# Patient Record
Sex: Female | Born: 1960 | Race: Black or African American | Hispanic: No | Marital: Married | Smoking: Former smoker
Health system: Southern US, Community
[De-identification: ages and names within clinical notes are randomized; demographics above are authoritative.]

## PROBLEM LIST (undated history)

## (undated) DIAGNOSIS — I509 Heart failure, unspecified: Secondary | ICD-10-CM

## (undated) DIAGNOSIS — I4891 Unspecified atrial fibrillation: Secondary | ICD-10-CM

## (undated) DIAGNOSIS — Z95810 Presence of automatic (implantable) cardiac defibrillator: Secondary | ICD-10-CM

## (undated) DIAGNOSIS — E079 Disorder of thyroid, unspecified: Secondary | ICD-10-CM

## (undated) DIAGNOSIS — F32A Depression, unspecified: Secondary | ICD-10-CM

## (undated) DIAGNOSIS — Z950 Presence of cardiac pacemaker: Secondary | ICD-10-CM

## (undated) DIAGNOSIS — T82110A Breakdown (mechanical) of cardiac electrode, initial encounter: Secondary | ICD-10-CM

## (undated) DIAGNOSIS — F1411 Cocaine abuse, in remission: Secondary | ICD-10-CM

## (undated) DIAGNOSIS — I5022 Chronic systolic (congestive) heart failure: Secondary | ICD-10-CM

## (undated) DIAGNOSIS — F1910 Other psychoactive substance abuse, uncomplicated: Secondary | ICD-10-CM

## (undated) DIAGNOSIS — K219 Gastro-esophageal reflux disease without esophagitis: Secondary | ICD-10-CM

## (undated) DIAGNOSIS — F329 Major depressive disorder, single episode, unspecified: Secondary | ICD-10-CM

## (undated) DIAGNOSIS — N182 Chronic kidney disease, stage 2 (mild): Secondary | ICD-10-CM

## (undated) DIAGNOSIS — I428 Other cardiomyopathies: Secondary | ICD-10-CM

## (undated) DIAGNOSIS — I471 Supraventricular tachycardia, unspecified: Secondary | ICD-10-CM

## (undated) DIAGNOSIS — I469 Cardiac arrest, cause unspecified: Secondary | ICD-10-CM

## (undated) DIAGNOSIS — Z87891 Personal history of nicotine dependence: Secondary | ICD-10-CM

## (undated) DIAGNOSIS — D649 Anemia, unspecified: Secondary | ICD-10-CM

## (undated) DIAGNOSIS — I10 Essential (primary) hypertension: Secondary | ICD-10-CM

## (undated) DIAGNOSIS — I447 Left bundle-branch block, unspecified: Secondary | ICD-10-CM

## (undated) DIAGNOSIS — R0602 Shortness of breath: Secondary | ICD-10-CM

## (undated) HISTORY — DX: Personal history of nicotine dependence: Z87.891

## (undated) HISTORY — DX: Other psychoactive substance abuse, uncomplicated: F19.10

## (undated) HISTORY — DX: Supraventricular tachycardia, unspecified: I47.10

## (undated) HISTORY — DX: Anemia, unspecified: D64.9

## (undated) HISTORY — DX: Breakdown (mechanical) of cardiac electrode, initial encounter: T82.110A

## (undated) HISTORY — DX: Supraventricular tachycardia: I47.1

## (undated) HISTORY — DX: Other cardiomyopathies: I42.8

## (undated) HISTORY — DX: Chronic kidney disease, stage 2 (mild): N18.2

## (undated) HISTORY — DX: Left bundle-branch block, unspecified: I44.7

## (undated) HISTORY — PX: CARDIAC DEFIBRILLATOR PLACEMENT: SHX171

## (undated) HISTORY — DX: Presence of automatic (implantable) cardiac defibrillator: Z95.810

## (undated) HISTORY — DX: Cocaine abuse, in remission: F14.11

## (undated) HISTORY — DX: Unspecified atrial fibrillation: I48.91

## (undated) HISTORY — DX: Chronic systolic (congestive) heart failure: I50.22

## (undated) HISTORY — DX: Cardiac arrest, cause unspecified: I46.9

---

## 2000-09-27 ENCOUNTER — Inpatient Hospital Stay (HOSPITAL_COMMUNITY): Admission: AD | Admit: 2000-09-27 | Discharge: 2000-09-27 | Payer: Self-pay | Admitting: Obstetrics & Gynecology

## 2000-09-30 ENCOUNTER — Encounter: Payer: Self-pay | Admitting: Emergency Medicine

## 2000-09-30 ENCOUNTER — Inpatient Hospital Stay (HOSPITAL_COMMUNITY): Admission: EM | Admit: 2000-09-30 | Discharge: 2000-10-03 | Payer: Self-pay | Admitting: Emergency Medicine

## 2000-10-27 ENCOUNTER — Emergency Department (HOSPITAL_COMMUNITY): Admission: EM | Admit: 2000-10-27 | Discharge: 2000-10-27 | Payer: Self-pay | Admitting: Emergency Medicine

## 2000-12-19 ENCOUNTER — Inpatient Hospital Stay (HOSPITAL_COMMUNITY): Admission: AD | Admit: 2000-12-19 | Discharge: 2000-12-19 | Payer: Self-pay | Admitting: Obstetrics

## 2000-12-19 ENCOUNTER — Encounter: Payer: Self-pay | Admitting: Obstetrics

## 2000-12-26 ENCOUNTER — Observation Stay (HOSPITAL_COMMUNITY): Admission: AD | Admit: 2000-12-26 | Discharge: 2000-12-27 | Payer: Self-pay | Admitting: *Deleted

## 2000-12-26 ENCOUNTER — Encounter: Payer: Self-pay | Admitting: *Deleted

## 2000-12-29 ENCOUNTER — Inpatient Hospital Stay (HOSPITAL_COMMUNITY): Admission: AD | Admit: 2000-12-29 | Discharge: 2000-12-31 | Payer: Self-pay | Admitting: Obstetrics

## 2001-01-05 ENCOUNTER — Encounter: Admission: RE | Admit: 2001-01-05 | Discharge: 2001-01-05 | Payer: Self-pay | Admitting: Obstetrics & Gynecology

## 2001-01-11 ENCOUNTER — Ambulatory Visit (HOSPITAL_COMMUNITY): Admission: RE | Admit: 2001-01-11 | Discharge: 2001-01-11 | Payer: Self-pay | Admitting: Obstetrics

## 2001-01-12 ENCOUNTER — Encounter: Admission: RE | Admit: 2001-01-12 | Discharge: 2001-01-12 | Payer: Self-pay | Admitting: Obstetrics & Gynecology

## 2001-02-03 ENCOUNTER — Encounter: Admission: RE | Admit: 2001-02-03 | Discharge: 2001-02-03 | Payer: Self-pay | Admitting: Obstetrics

## 2001-02-10 ENCOUNTER — Encounter: Admission: RE | Admit: 2001-02-10 | Discharge: 2001-02-10 | Payer: Self-pay | Admitting: Obstetrics

## 2001-02-17 ENCOUNTER — Encounter: Admission: RE | Admit: 2001-02-17 | Discharge: 2001-02-17 | Payer: Self-pay | Admitting: Obstetrics

## 2001-02-17 ENCOUNTER — Inpatient Hospital Stay (HOSPITAL_COMMUNITY): Admission: AD | Admit: 2001-02-17 | Discharge: 2001-02-17 | Payer: Self-pay | Admitting: Obstetrics & Gynecology

## 2001-02-24 ENCOUNTER — Encounter: Admission: RE | Admit: 2001-02-24 | Discharge: 2001-02-24 | Payer: Self-pay | Admitting: Obstetrics

## 2001-03-10 ENCOUNTER — Encounter: Admission: RE | Admit: 2001-03-10 | Discharge: 2001-03-10 | Payer: Self-pay | Admitting: Obstetrics

## 2001-03-20 ENCOUNTER — Inpatient Hospital Stay (HOSPITAL_COMMUNITY): Admission: AD | Admit: 2001-03-20 | Discharge: 2001-03-22 | Payer: Self-pay | Admitting: Obstetrics

## 2001-03-21 ENCOUNTER — Encounter: Payer: Self-pay | Admitting: Obstetrics

## 2001-05-06 ENCOUNTER — Inpatient Hospital Stay (HOSPITAL_COMMUNITY): Admission: AD | Admit: 2001-05-06 | Discharge: 2001-05-06 | Payer: Self-pay | Admitting: *Deleted

## 2001-07-18 ENCOUNTER — Emergency Department (HOSPITAL_COMMUNITY): Admission: EM | Admit: 2001-07-18 | Discharge: 2001-07-18 | Payer: Self-pay | Admitting: *Deleted

## 2003-07-19 ENCOUNTER — Emergency Department (HOSPITAL_COMMUNITY): Admission: EM | Admit: 2003-07-19 | Discharge: 2003-07-19 | Payer: Self-pay

## 2003-07-20 ENCOUNTER — Inpatient Hospital Stay (HOSPITAL_COMMUNITY): Admission: AD | Admit: 2003-07-20 | Discharge: 2003-07-20 | Payer: Self-pay | Admitting: Obstetrics & Gynecology

## 2004-05-01 ENCOUNTER — Inpatient Hospital Stay (HOSPITAL_COMMUNITY): Admission: AD | Admit: 2004-05-01 | Discharge: 2004-05-01 | Payer: Self-pay | Admitting: Obstetrics & Gynecology

## 2005-01-14 ENCOUNTER — Emergency Department (HOSPITAL_COMMUNITY): Admission: EM | Admit: 2005-01-14 | Discharge: 2005-01-14 | Payer: Self-pay | Admitting: Emergency Medicine

## 2005-06-29 ENCOUNTER — Emergency Department (HOSPITAL_COMMUNITY): Admission: EM | Admit: 2005-06-29 | Discharge: 2005-06-29 | Payer: Self-pay | Admitting: Emergency Medicine

## 2005-12-08 ENCOUNTER — Inpatient Hospital Stay (HOSPITAL_COMMUNITY): Admission: AD | Admit: 2005-12-08 | Discharge: 2005-12-08 | Payer: Self-pay | Admitting: *Deleted

## 2006-02-07 ENCOUNTER — Emergency Department (HOSPITAL_COMMUNITY): Admission: EM | Admit: 2006-02-07 | Discharge: 2006-02-08 | Payer: Self-pay | Admitting: Emergency Medicine

## 2006-06-09 ENCOUNTER — Inpatient Hospital Stay (HOSPITAL_COMMUNITY): Admission: AD | Admit: 2006-06-09 | Discharge: 2006-06-09 | Payer: Self-pay | Admitting: Gynecology

## 2006-08-14 ENCOUNTER — Inpatient Hospital Stay (HOSPITAL_COMMUNITY): Admission: EM | Admit: 2006-08-14 | Discharge: 2006-08-17 | Payer: Self-pay | Admitting: Emergency Medicine

## 2006-08-27 ENCOUNTER — Inpatient Hospital Stay (HOSPITAL_COMMUNITY): Admission: AD | Admit: 2006-08-27 | Discharge: 2006-08-27 | Payer: Self-pay | Admitting: Family Medicine

## 2006-09-15 ENCOUNTER — Ambulatory Visit (HOSPITAL_COMMUNITY): Admission: RE | Admit: 2006-09-15 | Discharge: 2006-09-15 | Payer: Self-pay | Admitting: Otolaryngology

## 2006-09-22 ENCOUNTER — Ambulatory Visit (HOSPITAL_COMMUNITY): Admission: RE | Admit: 2006-09-22 | Discharge: 2006-09-22 | Payer: Self-pay | Admitting: Otolaryngology

## 2006-09-29 ENCOUNTER — Ambulatory Visit (HOSPITAL_COMMUNITY): Admission: RE | Admit: 2006-09-29 | Discharge: 2006-09-29 | Payer: Self-pay | Admitting: Otolaryngology

## 2006-10-08 ENCOUNTER — Ambulatory Visit (HOSPITAL_BASED_OUTPATIENT_CLINIC_OR_DEPARTMENT_OTHER): Admission: RE | Admit: 2006-10-08 | Discharge: 2006-10-08 | Payer: Self-pay | Admitting: Otolaryngology

## 2007-12-26 ENCOUNTER — Emergency Department (HOSPITAL_COMMUNITY): Admission: EM | Admit: 2007-12-26 | Discharge: 2007-12-26 | Payer: Self-pay | Admitting: Family Medicine

## 2008-02-15 ENCOUNTER — Ambulatory Visit: Payer: Self-pay | Admitting: Family Medicine

## 2008-05-17 ENCOUNTER — Ambulatory Visit: Payer: Self-pay | Admitting: Internal Medicine

## 2008-05-25 ENCOUNTER — Ambulatory Visit: Payer: Self-pay | Admitting: Internal Medicine

## 2008-06-26 ENCOUNTER — Ambulatory Visit: Payer: Self-pay | Admitting: *Deleted

## 2008-07-03 ENCOUNTER — Emergency Department (HOSPITAL_COMMUNITY): Admission: EM | Admit: 2008-07-03 | Discharge: 2008-07-03 | Payer: Self-pay | Admitting: Emergency Medicine

## 2008-09-06 ENCOUNTER — Ambulatory Visit: Payer: Self-pay | Admitting: Family Medicine

## 2008-09-06 ENCOUNTER — Encounter: Payer: Self-pay | Admitting: Family Medicine

## 2008-09-21 ENCOUNTER — Ambulatory Visit (HOSPITAL_COMMUNITY): Admission: RE | Admit: 2008-09-21 | Discharge: 2008-09-21 | Payer: Self-pay | Admitting: Family Medicine

## 2008-12-10 ENCOUNTER — Encounter: Payer: Self-pay | Admitting: Family Medicine

## 2008-12-10 ENCOUNTER — Ambulatory Visit: Payer: Self-pay | Admitting: Family Medicine

## 2008-12-10 LAB — CONVERTED CEMR LAB
Albumin: 4 g/dL (ref 3.5–5.2)
Alkaline Phosphatase: 62 units/L (ref 39–117)
BUN: 14 mg/dL (ref 6–23)
Eosinophils Absolute: 0.1 10*3/uL (ref 0.0–0.7)
Eosinophils Relative: 2 % (ref 0–5)
Glucose, Bld: 63 mg/dL — ABNORMAL LOW (ref 70–99)
HCT: 35.2 % — ABNORMAL LOW (ref 36.0–46.0)
HDL: 52 mg/dL (ref >39)
Hemoglobin: 10.5 g/dL — ABNORMAL LOW (ref 12.0–15.0)
LDL Cholesterol: 145 mg/dL — ABNORMAL HIGH (ref 0–99)
Lymphs Abs: 1.7 10*3/uL (ref 0.7–4.0)
MCV: 74.1 fL — ABNORMAL LOW (ref 78.0–100.0)
Monocytes Absolute: 0.4 10*3/uL (ref 0.1–1.0)
Monocytes Relative: 7 % (ref 3–12)
Neutrophils Relative %: 60 % (ref 43–77)
Potassium: 4.2 meq/L (ref 3.5–5.3)
RBC: 4.75 M/uL (ref 3.87–5.11)
Triglycerides: 62 mg/dL (ref <150)
WBC: 5.6 10*3/uL (ref 4.0–10.5)

## 2008-12-26 ENCOUNTER — Ambulatory Visit: Payer: Self-pay | Admitting: Internal Medicine

## 2009-02-11 ENCOUNTER — Ambulatory Visit: Payer: Self-pay | Admitting: Internal Medicine

## 2009-02-11 ENCOUNTER — Encounter (INDEPENDENT_AMBULATORY_CARE_PROVIDER_SITE_OTHER): Payer: Self-pay | Admitting: *Deleted

## 2009-02-11 LAB — CONVERTED CEMR LAB
CO2: 19 meq/L (ref 19–32)
Calcium: 8.8 mg/dL (ref 8.4–10.5)
Glucose, Bld: 67 mg/dL — ABNORMAL LOW (ref 70–99)
LDL Cholesterol: 148 mg/dL — ABNORMAL HIGH (ref 0–99)
Potassium: 4 meq/L (ref 3.5–5.3)
Sodium: 139 meq/L (ref 135–145)
Total CHOL/HDL Ratio: 3.9
VLDL: 9 mg/dL (ref 0–40)

## 2009-03-14 ENCOUNTER — Ambulatory Visit: Payer: Self-pay | Admitting: Internal Medicine

## 2009-05-09 ENCOUNTER — Ambulatory Visit: Payer: Self-pay | Admitting: Internal Medicine

## 2009-08-29 ENCOUNTER — Ambulatory Visit: Payer: Self-pay | Admitting: Internal Medicine

## 2009-09-18 ENCOUNTER — Ambulatory Visit: Payer: Self-pay | Admitting: Internal Medicine

## 2010-01-16 ENCOUNTER — Emergency Department (HOSPITAL_COMMUNITY): Admission: EM | Admit: 2010-01-16 | Discharge: 2010-01-16 | Payer: Self-pay | Admitting: Emergency Medicine

## 2010-07-12 ENCOUNTER — Ambulatory Visit: Payer: Self-pay | Admitting: Pulmonary Disease

## 2010-07-12 ENCOUNTER — Inpatient Hospital Stay (HOSPITAL_COMMUNITY): Admission: EM | Admit: 2010-07-12 | Discharge: 2010-07-20 | Payer: Self-pay | Admitting: Emergency Medicine

## 2010-07-12 ENCOUNTER — Ambulatory Visit: Payer: Self-pay | Admitting: Internal Medicine

## 2010-07-13 ENCOUNTER — Encounter (INDEPENDENT_AMBULATORY_CARE_PROVIDER_SITE_OTHER): Payer: Self-pay | Admitting: Emergency Medicine

## 2010-07-18 ENCOUNTER — Telehealth: Payer: Self-pay | Admitting: Cardiovascular Disease

## 2010-07-21 ENCOUNTER — Encounter: Payer: Self-pay | Admitting: Internal Medicine

## 2010-07-22 ENCOUNTER — Telehealth: Payer: Self-pay | Admitting: Internal Medicine

## 2010-07-23 ENCOUNTER — Encounter: Payer: Self-pay | Admitting: Internal Medicine

## 2010-07-23 ENCOUNTER — Ambulatory Visit: Payer: Self-pay

## 2010-07-31 DEATH — deceased

## 2010-08-05 ENCOUNTER — Emergency Department (HOSPITAL_COMMUNITY): Admission: EM | Admit: 2010-08-05 | Discharge: 2010-08-05 | Payer: Self-pay | Admitting: Family Medicine

## 2010-08-05 ENCOUNTER — Ambulatory Visit: Payer: Self-pay | Admitting: Internal Medicine

## 2010-08-05 ENCOUNTER — Encounter: Payer: Self-pay | Admitting: Internal Medicine

## 2010-08-05 DIAGNOSIS — D649 Anemia, unspecified: Secondary | ICD-10-CM | POA: Insufficient documentation

## 2010-08-05 DIAGNOSIS — I428 Other cardiomyopathies: Secondary | ICD-10-CM

## 2010-08-05 DIAGNOSIS — I5022 Chronic systolic (congestive) heart failure: Secondary | ICD-10-CM

## 2010-08-06 ENCOUNTER — Telehealth: Payer: Self-pay | Admitting: Internal Medicine

## 2010-08-06 LAB — CONVERTED CEMR LAB
Basophils Relative: 0.2 % (ref 0.0–3.0)
Eosinophils Relative: 0.3 % (ref 0.0–5.0)
Lymphocytes Relative: 10.6 % — ABNORMAL LOW (ref 12.0–46.0)
Monocytes Relative: 9.6 % (ref 3.0–12.0)
Neutrophils Relative %: 79.3 % — ABNORMAL HIGH (ref 43.0–77.0)
RBC: 4.07 M/uL (ref 3.87–5.11)
WBC: 8 10*3/uL (ref 4.5–10.5)

## 2010-08-07 ENCOUNTER — Encounter: Payer: Self-pay | Admitting: Internal Medicine

## 2010-08-13 DIAGNOSIS — I447 Left bundle-branch block, unspecified: Secondary | ICD-10-CM

## 2010-08-13 DIAGNOSIS — I4901 Ventricular fibrillation: Secondary | ICD-10-CM | POA: Insufficient documentation

## 2010-08-13 DIAGNOSIS — F1910 Other psychoactive substance abuse, uncomplicated: Secondary | ICD-10-CM

## 2010-08-13 DIAGNOSIS — Z95810 Presence of automatic (implantable) cardiac defibrillator: Secondary | ICD-10-CM

## 2010-08-15 ENCOUNTER — Ambulatory Visit: Payer: Self-pay | Admitting: Cardiovascular Disease

## 2010-08-20 ENCOUNTER — Telehealth (INDEPENDENT_AMBULATORY_CARE_PROVIDER_SITE_OTHER): Payer: Self-pay | Admitting: *Deleted

## 2010-08-28 ENCOUNTER — Telehealth: Payer: Self-pay | Admitting: Cardiovascular Disease

## 2010-09-01 ENCOUNTER — Telehealth: Payer: Self-pay | Admitting: Cardiovascular Disease

## 2010-09-17 ENCOUNTER — Encounter: Payer: Self-pay | Admitting: Cardiovascular Disease

## 2010-09-17 ENCOUNTER — Ambulatory Visit: Payer: Self-pay | Admitting: Cardiovascular Disease

## 2010-10-08 ENCOUNTER — Telehealth: Payer: Self-pay | Admitting: Cardiovascular Disease

## 2010-10-28 ENCOUNTER — Ambulatory Visit: Payer: Self-pay | Admitting: Internal Medicine

## 2010-11-04 ENCOUNTER — Telehealth: Payer: Self-pay | Admitting: Internal Medicine

## 2010-11-12 ENCOUNTER — Encounter: Payer: Self-pay | Admitting: Cardiovascular Disease

## 2010-11-13 ENCOUNTER — Telehealth (INDEPENDENT_AMBULATORY_CARE_PROVIDER_SITE_OTHER): Payer: Self-pay | Admitting: *Deleted

## 2011-01-01 NOTE — Cardiovascular Report (Signed)
Summary: Office Visit   Office Visit   Imported By: Roderic Ovens 08/12/2010 12:45:08  _____________________________________________________________________  External Attachment:    Type:   Image     Comment:   External Document

## 2011-01-01 NOTE — Letter (Signed)
Summary: Generic Letter  [facility]  [address]   [place], [place] [address]   Phone: [phone]  Fax: [phone]        July 23, 2010 MRN: [id number]    [facility] [address] [place], [place]  [address]    To Whom It May Concern,    Ms. Elford will be out of work until her follow up with Dr. Graciela Husbands in September.  For further questions please feel free to contact our office at [phone].   Sincerely,  Dr. Ailene Ards, RN, BSN  This letter has been electronically signed by your physician.

## 2011-01-01 NOTE — Progress Notes (Signed)
Summary: Pt request call  Phone Note Call from Patient Call back at Home Phone [phone]   Caller: Patient Reason for Call: Talk to Nurse Initial call taken by: Judie Grieve,  November 04, 2010 2:25 PM  Follow-up for Phone Call        adv pt that ok to take bc powder.  Follow-up by: Claris Gladden RN,  November 04, 2010 5:50 PM

## 2011-01-01 NOTE — Miscellaneous (Signed)
Summary: Device preload  Clinical Lists Changes  Observations: Added new observation of ICD INDICATN: VF (2010-07-23 16:05) Added new observation of ICDLEADSTAT3: active (07-23-2010 16:05) Added new observation of ICDLEADSER3: [id number] (July 23, 2010 16:05) Added new observation of ICDLEADMOD3: 1258T (2010/07/23 16:05) Added new observation of ICDLEADLOC3: LV (07-23-10 16:05) Added new observation of ICDLEADSTAT2: active (07-23-2010 16:05) Added new observation of ICDLEADSER2: [id number] (Jul 23, 2010 16:05) Added new observation of ICDLEADMOD2: 7121  (23-Jul-2010 16:05) Added new observation of ICDLEADLOC2: RV  (07-23-10 16:05) Added new observation of ICDLEADSTAT1: active  (Jul 23, 2010 16:05) Added new observation of ICDLEADSER1: [id number]  (23-Jul-2010 16:05) Added new observation of ICDLEADMOD1: 2088TC  (07-23-10 16:05) Added new observation of ICDLEADLOC1: RA  (July 23, 2010 16:05) Added new observation of ICD IMP MD: Sherryl Manges, MD  (07/23/10 16:05) Added new observation of ICDLEADDOI3: 07/18/2010  (2010-07-23 16:05) Added new observation of ICDLEADDOI2: 07/18/2010  (07-23-10 16:05) Added new observation of ICDLEADDOI1: 07/18/2010  (07/23/2010 16:05) Added new observation of ICD IMPL DTE: 07/18/2010  (2010-07-23 16:05) Added new observation of ICD SERL#: [id number]  (2010/07/23 16:05) Added new observation of ICD MODL#: [id number]  (2010/07/23 20:25) Added new observation of ICDMANUFACTR: St Jude  (07-23-2010 16:05) Added new observation of ICD MD: Sherryl Manges, MD  (07-23-2010 16:05)       ICD Specifications Following MD:  Sherryl Manges, MD     ICD Vendor:  St Jude     ICD Model Number:  [id number]     ICD Serial Number:  [id number] ICD DOI:  07/18/2010     ICD Implanting MD:  Sherryl Manges, MD  Lead 1:    Location: RA     DOI: 07/18/2010     Model #: [id number]     Serial #: [id number]     Status: active Lead 2:    Location: RV     DOI: 07/18/2010     Model #: [id number]     Serial #: [id number]      Status: active Lead 3:    Location: LV     DOI: 07/18/2010     Model #: 1258T     Serial #: [id number]     Status: active  Indications::  VF

## 2011-01-01 NOTE — Progress Notes (Signed)
Summary: can pt take calcium  Phone Note Call from Patient Call back at Home Phone [phone]   Caller: Patient Reason for Call: Talk to Nurse Summary of Call: can pt take calcium. Initial call taken by: Lorne Skeens,  September 01, 2010 2:48 PM  Follow-up for Phone Call        Pt was advised to take calcium supplement after depo shot at family planning.  Okay to take calcium supplement. Mylo Red RN

## 2011-01-01 NOTE — Progress Notes (Signed)
Summary: Pt request call  Phone Note Call from Patient Call back at Home Phone [phone]   Caller: Patient Summary of Call: Pt request call Initial call taken by: Judie Grieve,  August 06, 2010 11:20 AM  Follow-up for Phone Call        lmtcb Scherrie Bateman, LPN  August 06, 2010 4:23 PM  lmtcb Scherrie Bateman, LPN  August 07, 2010 9:20 AM  PT STOPPED BY OFFICE  NEEDING RETURN TO WORK NOTE PER DR Graciela Husbands MAY RETURN WITHOUT RESTRICTIONS ON 08/18/10 BUT   NO DRIVING FOR 6 MONTHS PT AWARE WILL LEAVE NOTE AT FRONT DESK FOR PICK UP. Follow-up by: Scherrie Bateman, LPN,  August 07, 2010 1:36 PM

## 2011-01-01 NOTE — Letter (Signed)
Summary: Return To Work  [employer], Main Office  [address]   [place], [place] [address]   Phone: [phone]  Fax: [phone]    08/07/2010  TO: WHOM IT MAY CONCERN   RE: Angela  Payne [address] [id number]   The above named individual is under my medical care and may return to work on:08/18/10  WITH LIGHT DUTY BUT NO DRIVING FOR 6 MONTHS.  If you have any further questions or need additional information, please call.     Sincerely,   DDR STEVE KLEIN/ Scherrie Bateman, LPN

## 2011-01-01 NOTE — Assessment & Plan Note (Signed)
Summary: pc2/after hours message   Visit Type:  ICD-St Jude  CC:  shortness of breath, fatigued, and .  History of Present Illness: Angela Payne is seen following aborted cardiac arrest which occurred August 2011. She is found to have a nonischemic cardiomyopathy with an ejection fraction of 15-30% and underwent ICD implantation for secondary prevention   She is here for folllow up. She tells me that her chest is sore when she lifts boxes at work. She works at [employer] at [employer]. No  SOB, near syncope, syncope, dizziness, orthopnea or PND, LE edema. She has been taking all of her medications. Overall energy level is down. She is not interested in trying a beta blocker exchange trial.  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 Take One Tablet By Mouth Once Daily 2)  Carvedilol 6.25 Mg Tabs (Carvedilol) .... Take 1/2 Tablet By Mouth Twice A Day 3)  Klor-Con M20 20 Meq Cr-Tabs (Potassium Chloride Crys Cr) .... Take One Tablet Once Daily 4)  Aspirin 81 Mg Tbec (Aspirin) .... Take One Tablet By Mouth Daily  Allergies (verified): No Known Drug Allergies  Past History:  Past Medical History: Last updated: 09/17/2010 SUBSTANCE ABUSE, MULTIPLE (ICD-305.90) LEFT BUNDLE BRANCH BLOCK (ICD-426.3) IMPLANTATION OF DEFIBRILLATOR, HX OF (ICD-V45.02) VENTRICULAR FIBRILLATION (ICD-427.41) arrest SYSTOLIC HEART FAILURE, CHRONIC (ICD-428.22) ANEMIA (ICD-285.9) SINUS TACHYCARDIA (ICD-427.0) CARDIOMYOPATHY, DILATED  (ICD-425.4) EF of 29% by MRI August 2011, non-ischemic. ATRIAL FIBRILLATION-PAROXYSMAL (ICD-427.31) ABORTED CARDIAC ARREST (ACA) (ICD-427.5)    Past Surgical History: Last updated: 08/13/2010 [facility]. Jude Unify [id number] Q defibrillator, serial number  [id number].   Family History: Last updated: 09/01/10 Mother-deceased, ? cause Father-alive, healthy  Social History: Last updated: 09/01/10 Former tobacco use, smoked for 14 years-quit August 2011.(1ppd) No alcohol No illicit drug use-former crack cocaine use last 2008 Single 4 kids Works for [employer] unemployed.   Vital Signs:  Patient profile:   50 year old female Height:      64 inches Weight:      157.75 pounds BMI:     27.18 Pulse rate:   76 / minute BP sitting:   135 / 92  (left arm) Cuff size:   regular  Vitals Entered By: Caralee Ates CMA (October 28, 2010 3:07 PM)  Physical Exam  General:  The patient was alert and oriented in no acute distress. HEENT Normal.  Neck veins were flat, carotids were brisk.  Lungs were clear.  Heart sounds were regular without murmurs or gallops.  Abdomen was soft with active bowel sounds. There is no clubbing cyanosis or edema. Skin Warm and dry     ICD Specifications Following MD:  Sherryl Manges, MD     ICD Vendor:  St Jude     ICD Model Number:  [id number]     ICD Serial Number:  [id number] ICD DOI:  07/18/2010     ICD Implanting MD:  Sherryl Manges, MD  Lead 1:    Location: RA     DOI: 07/18/2010     Model #: [id number]     Serial #: [id number]  Status: active Lead 2:    Location: RV     DOI: 07/18/2010     Model #: 1610     Serial #: [id number]     Status: active Lead 3:    Location: LV     DOI: 07/18/2010     Model #: 1258T     Serial #: [id number]     Status: active  Indications::  VF   ICD Follow Up Remote Check?  No Charge Time:  8.4 seconds     Battery Est. Longevity:  6.6 years Underlying rhythm:  SR ICD Dependent:  No       ICD Device Measurements Atrium:   Amplitude: 3.2 mV, Impedance: 360 ohms, Threshold: 0.625 V at 0.5 msec Right Ventricle:  Amplitude: 12 mV, Impedance: 480 ohms, Threshold: 0.5 V at 0.5 msec Left Ventricle:  Impedance: 850 ohms, Threshold: 1.125 V at 0.5 msec Shock Impedance: 42 ohms   Episodes MS Episodes:  0     Percent Mode Switch:  0     Coumadin:  No Shock:  0     ATP:  0     Nonsustained:  0     Atrial Pacing:  7.8%     Ventricular Pacing:  100%  Brady Parameters Mode DDD     Lower Rate Limit:  60     Upper Rate Limit 120 PAV 110     Sensed AV Delay:  110  Tachy Zones VF:  240     VT:  200     VT1:  150     Next Cardiology Appt Due:  12/31/2010 Tech Comments:  Outputs reprogrammed for chronic thresholds, autocapture on.  Quick opt done.  Changed to 2 zone device.   ROV 3 months clinic. Altha Harm, LPN  October 28, 2010 3:57 PM n  Impression & Recommendations:  Problem # 1:  VENTRICULAR FIBRILLATION (ICD-427.41) no recurrent ventricular arrhythmias Her updated medication list for this problem includes:    Lisinopril 5 Mg Tabs (Lisinopril) .Marland Kitchen... Take one tablet by mouth once daily    Carvedilol 6.25 Mg Tabs (Carvedilol) .Marland Kitchen... Take 1/2 tablet by mouth twice a day    Aspirin 81 Mg Tbec (Aspirin) .Marland Kitchen... Take one tablet by mouth daily  Problem # 2:  SYSTOLIC HEART FAILURE, CHRONIC (ICD-428.22) stable on current meds; I will defer to Dr. Colon Branch the addition of Aldactone Her updated medication list for this problem includes:    Lisinopril 5 Mg Tabs (Lisinopril) .Marland Kitchen... Take one tablet by mouth once daily    Carvedilol 6.25 Mg Tabs (Carvedilol) .Marland Kitchen... Take 1/2 tablet by mouth twice a day    Aspirin 81 Mg Tbec (Aspirin) .Marland Kitchen... Take one tablet by mouth daily  Problem # 3:  IMPLANTATION OF DEFIBRILLATOR, HX OF (ICD-V45.02) Device parameters and data were reviewed and no changes were made  Patient Instructions: 1)  Your physician recommends that you continue on your current medications as directed. Please refer to the  Current Medication list given to you today. 2)  Your physician wants you to follow-up in: 9 months   You will receive a reminder letter in the mail two months in advance. If you don't receive a letter, please call our office to schedule the follow-up appointment.

## 2011-01-01 NOTE — Assessment & Plan Note (Signed)
Summary: eph/jml   Visit Type:  Post-hospital  CC:  irregular heart beat.  History of Present Illness: 50 yo AAF with no prior past medical history who was admitted to [facility] in August 2011 after an out of hospital v. fib arrest. She was cooled on the Arctic sun protocol and underwent a cardiac cath which showed no evidence of obstructive CAD. 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 She had an ICD implanted by Dr. Graciela Husbands on July 18, 2010.   She is here for hospital folllow up. She is doing well. She has had no chest pain, SOB, near syncope, syncope, dizziness, orthopnea or PND, LE edema. She has been taking all of her medications. Occasional fluttering in her chest.   Current Medications (verified): 1)  Lisinopril 5 Mg Tabs (Lisinopril) .... Take One Tablet By Mouth Once Daily 2)  Carvedilol 25 Mg Tabs (Carvedilol) .... Take 1/2 Tablet Two Times A Day 3)  Oxycodone-Acetaminophen 5-325 Mg Tabs (Oxycodone-Acetaminophen) .... Take One Tablet Every 4 Hrs As Needed For Pain 4)  Klor-Con M20 20 Meq Cr-Tabs (Potassium Chloride Crys Cr) .... Take One Tablet Once Daily  Allergies (verified): No Known Drug Allergies  Past History:  Past Medical History: Reviewed history from 08/13/2010 and no changes required. SUBSTANCE ABUSE, MULTIPLE (ICD-305.90) LEFT BUNDLE BRANCH BLOCK (ICD-426.3) IMPLANTATION OF DEFIBRILLATOR, HX OF (ICD-V45.02) VENTRICULAR FIBRILLATION (ICD-427.41) SYSTOLIC HEART FAILURE, CHRONIC (ICD-428.22) ANEMIA (ICD-285.9) SINUS TACHYCARDIA (ICD-427.0) CARDIOMYOPATHY, DILATED (ICD-425.4) ATRIAL FIBRILLATION-PAROXYSMAL (ICD-427.31) ABORTED CARDIAC ARREST (ACA) (ICD-427.5)    Past Surgical History: Reviewed history from 08/13/2010 and no changes required. [address] Jude Unify [id number] Q defibrillator, serial number  [id number].   Family History: Mother-deceased, ? cause Father-alive, healthy  Social History: Former  tobacco use, smoked for 14 years-quit August 2011.(1ppd) No alcohol No illicit drug use-former crack cocaine use last 2008 Single 4 kids Works for [employer] unemployed.   Review of Systems       The patient complains of palpitations.  The patient denies fatigue, malaise, fever, weight gain/loss, vision loss, decreased hearing, hoarseness, chest pain, shortness of breath, prolonged cough, wheezing, sleep apnea, coughing up blood, abdominal pain, blood in stool, nausea, vomiting, diarrhea, heartburn, incontinence, blood in urine, muscle weakness, joint pain, leg swelling, rash, skin lesions, headache, fainting, dizziness, depression, anxiety, enlarged lymph nodes, easy bruising or bleeding, and environmental allergies.    Vital Signs:  Patient profile:   50 year old female Height:      64 inches Weight:      150.25 pounds BMI:     25.88 Pulse rate:   62 / minute Pulse rhythm:   irregular Resp:     18 per minute BP sitting:   104 / 74  (left arm) Cuff size:   large  Vitals Entered By: Vikki Ports (August 15, 2010 8:46 AM)  Physical Exam  General:  General: Well developed, well nourished, NAD HEENT: OP clear, mucus membranes moist SKIN: warm, dry Neuro: No focal deficits Musculoskeletal: Muscle strength 5/5 all ext Psychiatric: Mood and affect normal Neck: No JVD, no carotid bruits, no thyromegaly, no lymphadenopathy. Lungs:Clear bilaterally, no wheezes, rhonci, crackles Chest wall with well healed ICD wound CV: RRR no murmurs, gallops rubs Abdomen: soft, NT, ND, BS present Extremities: No edema, pulses 2+.    EKG  Procedure date:  08/15/2010  Findings:      NSR, rate 62 bpm. Poor R wave progression through precordial leads.    ICD Specifications Following MD:  Sherryl Manges, MD     ICD Vendor:  St Jude     ICD Model Number:  [id number]     ICD Serial Number:  [id number] ICD DOI:  07/18/2010     ICD Implanting MD:  Sherryl Manges, MD  Lead 1:    Location: RA      DOI: 07/18/2010     Model #: [id number]     Serial #: [id number]     Status: active Lead 2:    Location: RV     DOI: 07/18/2010     Model #: 7121     Serial #: [id number]     Status: active Lead 3:    Location: LV     DOI: 07/18/2010     Model #: 1258T     Serial #: [id number]     Status: active  Indications::  VF   Brady Parameters Mode DDD     Lower Rate Limit:  60     Upper Rate Limit 120 PAV 110     Sensed AV Delay:  110  Tachy Zones VF:  240     VT:  200     VT1:  150     Impression & Recommendations:  Problem # 1:  CARDIOMYOPATHY, DILATED (ICD-425.4)  Doing well. Likely secondary to prior cocaine use. BP ok. Tolerating meds. Consider adding aldactone at next visit. Will add ASA 81 mg per day. Continue beta blocker and Ace-inhibitor.  Repeat echo in 6 months.   Her updated medication list for this problem includes:    Lisinopril 5 Mg Tabs (Lisinopril) .Marland Kitchen... Take one tablet by mouth once daily    Carvedilol 25 Mg Tabs (Carvedilol) .Marland Kitchen... Take 1/2 tablet two times a day    Aspirin 81 Mg Tbec (Aspirin) .Marland Kitchen... Take one tablet by mouth daily    Her updated medication list for this problem includes:    Lisinopril 5 Mg Tabs (Lisinopril) .Marland Kitchen... Take one tablet by mouth once daily    Carvedilol 25 Mg Tabs (Carvedilol) .Marland Kitchen... Take 1/2 tablet two times a day    Aspirin 81 Mg Tbec (Aspirin) .Marland Kitchen... Take one tablet by mouth daily  Orders: Echocardiogram (Echo)  Problem # 2:  SYSTOLIC HEART FAILURE, CHRONIC (ICD-428.22) See above.   Her updated medication list for this problem includes:    Lisinopril 5 Mg Tabs (Lisinopril) .Marland Kitchen... Take one tablet by mouth once daily    Carvedilol 25 Mg Tabs (Carvedilol) .Marland Kitchen... Take 1/2 tablet two times a day    Aspirin 81 Mg Tbec (Aspirin) .Marland Kitchen... Take one tablet by mouth daily  Her updated medication list for this problem includes:    Lisinopril 5 Mg Tabs (Lisinopril) .Marland Kitchen... Take one tablet by mouth once daily    Carvedilol 25 Mg Tabs (Carvedilol) .Marland Kitchen... Take 1/2  tablet two times a day    Aspirin 81 Mg Tbec (Aspirin) .Marland Kitchen... Take one tablet by mouth daily  Problem # 3:  IMPLANTATION OF DEFIBRILLATOR, HX OF (ICD-V45.02) Followed by Dr. Graciela Husbands. Education by [facility] staff today.   Orders: EKG w/ Interpretation ([address])  Patient Instructions: 1)  Your physician recommends that you schedule a follow-up appointment in: 6 months 2)  Your physician has recommended you make the following change in your medication: START taking a baby Aspirin 81mg  by mouth daily. 3)  Your physician has requested that you have an echocardiogram in 6 months/ 425.4/428.22.   Echocardiography is a painless test that uses sound waves to create images of your  heart. It provides your doctor with information about the size and shape of your heart and how well your heart's chambers and valves are working.  This procedure takes approximately one hour. There are no restrictions for this procedure.

## 2011-01-01 NOTE — Assessment & Plan Note (Signed)
Summary: rov/wpa   Visit Type:  rov  CC:  chest pain.Marland Kitchendenies any edema.  History of Present Illness: 50 yo AAF with no prior past medical history who was admitted to [facility] in August 2011 after an out of hospital v. fib arrest. She was cooled on the Arctic sun protocol and underwent a cardiac cath which showed no evidence of obstructive CAD. Echo showed EF of 15%. Cardiac MRI showed EF of 29%. 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 She had an ICD implanted by Dr. Graciela Husbands on July 18, 2010.   She is here for folllow up. She tells me that her chest is sore when she lifts boxes at work. She works at [employer] at [employer]. No  SOB, near syncope, syncope, dizziness, orthopnea or PND, LE edema. She has been taking all of her medications. Overall energy level is down.   Current Medications (verified): 1)  Lisinopril 5 Mg Tabs (Lisinopril) .... Take One Tablet By Mouth Once Daily 2)  Carvedilol 25 Mg Tabs (Carvedilol) .... Take 1/2 Tablet Two Times A Day 3)  Oxycodone-Acetaminophen 5-325 Mg Tabs (Oxycodone-Acetaminophen) .... Take One Tablet Every 4 Hrs As Needed For Pain 4)  Klor-Con M20 20 Meq Cr-Tabs (Potassium Chloride Crys Cr) .... Take One Tablet Once Daily 5)  Aspirin 81 Mg Tbec (Aspirin) .... 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 ATRIAL FIBRILLATION-PAROXYSMAL (ICD-427.31) ABORTED CARDIAC ARREST (ACA) (ICD-427.5)    Social History: Reviewed history from 08/15/2010 and no changes required. Former  tobacco use, smoked for 14 years-quit August 2011.(1ppd) No alcohol No illicit drug use-former crack cocaine use last 2008 Single 4 kids Works for [employer] unemployed.   Review of Systems       The patient complains of fatigue and chest pain.  The patient denies malaise, fever, weight gain/loss, vision loss, decreased hearing, hoarseness, palpitations, shortness of breath, prolonged cough, wheezing, sleep apnea, coughing up blood, abdominal pain, blood in stool, nausea, vomiting, diarrhea, heartburn, incontinence, blood in urine, muscle weakness, joint pain, leg swelling, rash, skin lesions, headache, fainting, dizziness, depression, anxiety, enlarged lymph nodes, easy bruising or bleeding, and environmental allergies.    Vital Signs:  Patient profile:   50 year old female Height:      64 inches Weight:      155 pounds BMI:     26.70 Pulse rate:   83 / minute Pulse rhythm:   irregular BP sitting:   108 / 74  (left arm) Cuff size:   large  Vitals Entered By: Danielle Rankin, CMA (September 17, 2010 3:35 PM)  Physical Exam  General:  General: Well developed, well nourished, NAD HEENT: OP clear, mucus membranes moist Psychiatric: Mood and affect normal Neck: No JVD, no carotid bruits, no thyromegaly, no lymphadenopathy. Lungs:Clear bilaterally, no wheezes, rhonci, crackles CV: RRR no murmurs, gallops rubs Abdomen: soft, NT, ND, BS present Extremities: No edema, pulses 2+.    EKG  Procedure date:  09/17/2010  Findings:      Sinus rhythm, rate 83 bpm.    ICD Specifications Following MD:  Sherryl Manges, MD     ICD Vendor:  [facility]  ICD Model Number:  [id number]     ICD Serial Number:  [id number] ICD DOI:  07/18/2010     ICD Implanting MD:  Sherryl Manges, MD  Lead 1:    Location: RA     DOI: 07/18/2010     Model #: [id number]     Serial #: [id number]     Status: active Lead 2:    Location: RV     DOI: 07/18/2010     Model #: [id number]     Serial #: [id number]     Status: active Lead 3:     Location: LV     DOI: 07/18/2010     Model #: 1258T     Serial #: [id number]     Status: active  Indications::  VF   Brady Parameters Mode DDD     Lower Rate Limit:  60     Upper Rate Limit 120 PAV 110     Sensed AV Delay:  110  Tachy Zones VF:  240     VT:  200     VT1:  150     Impression & Recommendations:  Problem # 1:  CARDIOMYOPATHY, DILATED (ICD-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 This may be contributing to her fatigue. Repeat echo in 6 months as planned.   Her updated medication list for this problem includes:    Lisinopril 5 Mg Tabs (Lisinopril) .Marland Kitchen... Take one tablet by mouth once daily    Carvedilol 6.25 Mg Tabs (Carvedilol) .Marland Kitchen... Take one tablet by mouth twice a day    Aspirin 81 Mg Tbec (Aspirin) .Marland Kitchen... Take one tablet by mouth daily  Her updated medication list for this problem includes:    Lisinopril 5 Mg Tabs (Lisinopril) .Marland Kitchen... Take one tablet by mouth once daily    Carvedilol 6.25 Mg Tabs (Carvedilol) .Marland Kitchen... Take one tablet by mouth twice a day    Aspirin 81 Mg Tbec (Aspirin) .Marland Kitchen... Take one tablet by mouth daily  Problem # 2:  SYSTOLIC HEART FAILURE, CHRONIC (ICD-428.22) Stable.   Her updated medication list for this problem includes:    Lisinopril 5 Mg Tabs (Lisinopril) .Marland Kitchen... Take one tablet by mouth once daily    Carvedilol 6.25 Mg Tabs (Carvedilol) .Marland Kitchen... Take one tablet by mouth twice a day    Aspirin 81 Mg Tbec (Aspirin) .Marland Kitchen... Take one tablet by mouth daily  Her updated medication list for this problem includes:    Lisinopril 5 Mg Tabs (Lisinopril) .Marland Kitchen... Take one tablet by mouth once daily    Carvedilol 6.25 Mg Tabs (Carvedilol) .Marland Kitchen... Take one tablet by mouth twice a day    Aspirin 81 Mg Tbec (Aspirin) .Marland Kitchen... Take one tablet by mouth daily  Other Orders: EKG w/ Interpretation (93000)  Patient Instructions: 1)  Your physician recommends that you schedule a follow-up appointment in March, 2012. 2)  Your physician has recommended you  make the following change in your medication:  DECREASE your Coreg to 6.25 mg by mouth two times a day. Prescriptions: CARVEDILOL 6.25 MG TABS (CARVEDILOL) Take one tablet by mouth twice a day  #60 x 8   Entered by:   Whitney Maeola Sarah RN   Authorized by:   Verne Carrow, MD   Signed by:   Ellender Hose RN on 09/17/2010   Method used:   Print then Give to Patient   RxID:   [id number]

## 2011-01-01 NOTE — Assessment & Plan Note (Signed)
Summary: site is swollen and bleeding     ICD Specifications Following MD:  Sherryl Manges, MD     ICD Vendor:  [facility]     ICD Model Number:  [id number]     ICD Serial Number:  [id number] ICD DOI:  07/18/2010     ICD Implanting MD:  Sherryl Manges, MD  Lead 1:    Location: RA     DOI: 07/18/2010     Model #: [id number]     Serial #: [id number]     Status: active Lead 2:    Location: RV     DOI: 07/18/2010     Model #: 2841     Serial #: [id number]     Status: active Lead 3:    Location: LV     DOI: 07/18/2010     Model #: 1258T     Serial #: [id number]     Status: active  Indications::  VF  Next Cardiology Appt Due:  08/05/2010 Tech Comments:  PT CALLED WITH SWELLING AT SITE AND BLEEDING.  NO ACTIVE BLEEDING AND SOME SWELLING. DR Johney Frame LOOKED AT SITE AND CONFIRMED IT WAS OK.  MORE STERI STRIPS WERE PLACED ON SITE.  PT SCHEDULED TO SEE DR Graciela Husbands ON 08-05-10 @ 1010 AND CANCELLED OV ON 08-08-10.  PT WAS GIVEN A LETTER FOR WORK. PT INSTRUCTED NOT TO GET SITE WET AND AWARE OF OV IN [place].  PT TO CALL IF ANY OTHER PROBLEMS ARISE. Angela Payne  July 23, 2010 11:22 AM

## 2011-01-01 NOTE — Assessment & Plan Note (Signed)
Summary: pacer ck/mt   CC:  pacer check.  Pt states that something has been going on with her throat.  She has had low grade fevers and sore throat x 13 days.Marland Kitchen  History of Present Illness:    Angela Payne is seen in followup for a CRT-D implantation following ventricular fibrillation in the context of a nonischemic cardiomyopathy. She also had congestive heart failure and left bundle branch block. In hospital she was noted to have paroxysmal atrial fibrillation  Her defibrillator site is well-healed.  she denies chest pain or shortness of breath.  Her major complaint is a sore throat which has persisted for the last 3-4 weeks.  Current Medications (verified): 1)  Lisinopril 5 Mg Tabs (Lisinopril) .... Take One Tablet By Mouth Once Daily 2)  Carvedilol 25 Mg Tabs (Carvedilol) .... Take 1/2 Tablet Two Times A Day 3)  Oxycodone-Acetaminophen 5-325 Mg Tabs (Oxycodone-Acetaminophen) .... Take One Tablet Every 4 Hrs As Needed For Pain 4)  Klor-Con M20 20 Meq Cr-Tabs (Potassium Chloride Crys Cr) .... Take One Tablet Once Daily  Allergies (verified): No Known Drug Allergies  Vital Signs:  Patient profile:   50 year old female Height:      64 inches Weight:      148 pounds BMI:     25.50 Temp:     98.1 degrees F oral Pulse rate:   85 / minute Pulse rhythm:   regular BP sitting:   128 / 80  (left arm) Cuff size:   regular  Vitals Entered By: Judithe Modest CMA (August 05, 2010 9:54 AM)  Physical Exam  General:  The patient was alert and oriented in no acute distress. HEENT Normal.  Neck veins were flat, carotids were brisk. there was no lymphadenopathy. The throat was mildly erythematous with some papules on the pillars bilaterally Lungs were clear.  Heart sounds were regular without murmurs or gallops.  Abdomen was soft with active bowel sounds. There is no clubbing cyanosis or edema. Skin Warm and dry device pocket is well healed    ICD Specifications Following MD:  Sherryl Manges, MD     ICD Vendor:  [employer]     ICD Model Number:  [id number]     ICD Serial Number:  [id number] ICD DOI:  07/18/2010     ICD Implanting MD:  Sherryl Manges, MD  Lead 1:    Location: RA     DOI: 07/18/2010     Model #: [id number]     Serial #: [id number]     Status: active Lead 2:    Location: RV     DOI: 07/18/2010     Model #: 0865     Serial #: [id number]     Status: active Lead 3:    Location: LV     DOI: 07/18/2010     Model #: 1258T     Serial #: [id number]     Status: active  Indications::  VF   ICD Follow Up Battery Voltage:  >95% V     Charge Time:  8.4 seconds     Battery Est. Longevity:  4.1 yrs Underlying rhythm:  SR   ICD Device Measurements Atrium:  Amplitude: 3.1 mV, Impedance: 310 ohms, Threshold: 0.5 V at 0.5 msec Right Ventricle:  Amplitude: 12.0 mV, Impedance: 450 ohms, Threshold: 0.5 V at 0.5 msec Left Ventricle:  Impedance: 760 ohms, Threshold: 1.25 V at 0.5 msec  Episodes MS Episodes:  0     Shock:  0     ATP:  0     Nonsustained:  0     Atrial Therapies:  0 Atrial Pacing:  <1%     Ventricular Pacing:  99%  Brady Parameters Mode DDD     Lower Rate Limit:  60     Upper Rate Limit 120 PAV 110     Sensed AV Delay:  110  Tachy Zones VF:  240     VT:  200     VT1:  150     Next Cardiology Appt Due:  10/21/2010 Tech Comments:  STERI STRIPS REMOVED.  NORMAL DEVICE FUNCTION.  NO EPISODES SINCE LAST CHECK.  ROV 10-21-10 W/SK. Vella Kohler  August 05, 2010 10:02 AM  Impression & Recommendations:  Problem # 1:  ABORTED CARDIAC ARREST (ACA) (ICD-427.5) the patient is status post cardiac arrest. SHe is on appropriate medications for her cardiomyopathy. Aldactone would be appropriately added ultimately. Her heart rate is fast today somewhat over 100. Her mean heart rate is high 80s low 90s. We'll plan to increase her carvedilol today from 12.5-25 b.i.d. Her updated medication list for this problem includes:    Lisinopril 5 Mg Tabs (Lisinopril) .Marland Kitchen... Take one tablet by mouth  once daily    Carvedilol 25 Mg Tabs (Carvedilol) .Marland Kitchen... Take 1/2 tablet two times a day  Problem # 2:  SINUS TACHYCARDIA (ICD-427.0) as above; review of her laboratories from the hospital showed her hemoglobin was low at about 9. We will recheck it today. Her TSH was normal. I suspect that this is a manifestation of congestive cardiomyopathy  Problem # 3:  CARDIOMYOPATHY, DILATED (ICD-425.4) as above. We'll increase her carvedilol and then her next visit last Dr.CMac to consider increasing adding aldactone Her updated medication list for this problem includes:    Lisinopril 5 Mg Tabs (Lisinopril) .Marland Kitchen... Take one tablet by mouth once daily    Carvedilol 25 Mg Tabs (Carvedilol) .Marland Kitchen... Take 1/2 tablet two times a day  Problem # 4:  SYSTOLIC HEART FAILURE, CHRONIC (ICD-428.22) as above Her updated medication list for this problem includes:    Lisinopril 5 Mg Tabs (Lisinopril) .Marland Kitchen... Take one tablet by mouth once daily    Carvedilol 25 Mg Tabs (Carvedilol) .Marland Kitchen... Take 1/2 tablet two times a day  Problem # 5:  ANEMIA (ICD-285.9) as above Orders: TLB-CBC Platelet - w/Differential (85025-CBCD)  Problem # 6:  SORE THROAT (ICD-462) I don't see anything. We don't have culture ligament tear I suggested she go to urgent care  Patient Instructions: 1)  Your physician has recommended you make the following change in your medication: Take Carvedilol 25mg  twice a day.  2)  Your physician recommends that you have lab work: CBC

## 2011-01-01 NOTE — Progress Notes (Signed)
Summary: Pt request call  Phone Note Call from Patient Call back at Home Phone [phone] Message from:  Patient on October 08, 2010 4:13 PM  Caller: Patient Summary of Call: pt request call Initial call taken by: Judie Grieve,  October 08, 2010 4:13 PM  Follow-up for Phone Call        Pt. is wanting her Oxycodone refilled. I explained to her that she would have to f/u with a PCP for that. She is aware. Whitney Maeola Sarah RN  October 08, 2010 5:19 PM  Follow-up by: Whitney Maeola Sarah RN,  October 08, 2010 5:04 PM

## 2011-01-01 NOTE — Progress Notes (Signed)
  DDS request recieved sent to [facility]. Cala Bradford Mesiemore  August 20, 2010 2:49 PM

## 2011-01-01 NOTE — Progress Notes (Signed)
Summary: needs letter re being out of work  Phone Note Call from Patient   Caller: Patient Reason for Call: Talk to Nurse Summary of Call: pt's dtr states pt had pacemaker put in today-she has section 8 housing and needs a letter to her case manager, jacqueline williams fax [phone], stating pt will be out of work indefinitetly Initial call taken by: Glynda Jaeger,  July 18, 2010 1:02 PM  Follow-up for Phone Call        spoke with Rosann Auerbach she is going to have [place] look into and writ the note  Dennis Bast, RN, BSN  July 18, 2010 1:29 PM

## 2011-01-01 NOTE — Progress Notes (Addendum)
  DDS request received sent to [facility]  November 13, 2010 3:13 PM     Appended Document:  DDS request received sent to [facility]

## 2011-01-01 NOTE — Progress Notes (Signed)
Summary: Need letter due to being out work  Phone Note Call from Patient Call back at [employer] [phone]   Caller: Patient Summary of Call: Pt request call about getting a letter about being out work Initial call taken by: Judie Grieve,  July 22, 2010 1:59 PM  Follow-up for Phone Call        will need note for work until we see her Follow-up by: Nathen May, MD, [facility],  July 22, 2010 6:01 PM

## 2011-01-01 NOTE — Letter (Signed)
Summary: Generic Letter  Architectural technologist, Main Office  [address]   [place], [place] [address]   Phone: [phone]  Fax: [phone]        August 05, 2010 MRN: [id number]    [facility] [address] [place], [place]  [address]    Dear Ms. Adduci,  You have been cleared by Dr. Graciela Husbands to receive Depo injection.     Sincerely,  Dr. Achilles Dunk RN  This letter has been electronically signed by your physician.

## 2011-01-01 NOTE — Progress Notes (Signed)
Summary: complication at work  Phone Note Call from Patient Call back at [facility] Phone [phone]   Caller: Patient Reason for Call: Talk to Nurse Summary of Call: per pt called pt having complication at work need to discuss.  Initial call taken by: Lorne Skeens,  August 28, 2010 4:03 PM  Follow-up for Phone Call        Pt. started back work on 9/19. She is working @ [employer] and is C/O being extremely tired & is having CP occassionally but only at work. Explained to her that if she is only experiencing the pain while lifting objects, this could just be muscular but to let us know if the CP persists or she has any SOB/LE edema. Pt. has filed for disability & would like to have some more time off of work.  Whitney Maeola Sarah RN  August 28, 2010 4:23 PM  Follow-up by: Whitney Maeola Sarah RN,  August 28, 2010 4:15 PM

## 2011-01-07 NOTE — Letter (Signed)
Summary: Disability Determination Services  Disability Determination Services   Imported By: Marylou Mccoy 12/29/2010 15:02:38  _____________________________________________________________________  External Attachment:    Type:   Image     Comment:   External Document

## 2011-01-12 ENCOUNTER — Telehealth: Payer: Self-pay | Admitting: Cardiovascular Disease

## 2011-01-15 ENCOUNTER — Encounter (INDEPENDENT_AMBULATORY_CARE_PROVIDER_SITE_OTHER): Payer: Self-pay

## 2011-01-15 ENCOUNTER — Encounter: Payer: Self-pay | Admitting: Internal Medicine

## 2011-01-15 DIAGNOSIS — I4901 Ventricular fibrillation: Secondary | ICD-10-CM

## 2011-01-21 NOTE — Progress Notes (Signed)
Summary: LISINOPRIL 5 MG TABS  Phone Note Refill Request Call back at Home Phone [phone] Message from:  Patient on January 12, 2011 3:46 PM  Refills Requested: Medication #1:  LISINOPRIL 5 MG TABS take one tablet by mouth once daily [facility]   Method Requested: Fax to Local Pharmacy Initial call taken by: Lorne Skeens,  January 12, 2011 3:47 PM    Prescriptions: LISINOPRIL 5 MG TABS (LISINOPRIL) take one tablet by mouth once daily  #30 x 2   Entered by:   Caralee Ates CMA   Authorized by:   Verne Carrow, MD   Signed by:   Caralee Ates CMA on 01/13/2011   Method used:   Electronically to        [facility] (retail)       [address]       [place], [place]  [id number]       Ph: [phone]       Fax: [phone]   RxID:   [id number]

## 2011-01-25 ENCOUNTER — Emergency Department (HOSPITAL_COMMUNITY)
Admission: EM | Admit: 2011-01-25 | Discharge: 2011-01-25 | Disposition: A | Discharge status: Home / Self Care | Payer: Self-pay | Attending: Emergency Medicine | Admitting: Emergency Medicine

## 2011-01-25 DIAGNOSIS — L02219 Cutaneous abscess of trunk, unspecified: Secondary | ICD-10-CM | POA: Insufficient documentation

## 2011-01-27 NOTE — Procedures (Signed)
Summary: device/saf  mca   Current Medications (verified): 1)  Lisinopril 5 Mg Tabs (Lisinopril) .... Take One Tablet By Mouth Once Daily 2)  Carvedilol 6.25 Mg Tabs (Carvedilol) .... Take 1/2 Tablet By Mouth Twice A Day 3)  Klor-Con M20 20 Meq Cr-Tabs (Potassium Chloride Crys Cr) .... Take One Tablet Once Daily 4)  Aspirin 81 Mg Tbec (Aspirin) .... Take One Tablet By Mouth Daily  Allergies (verified): No Known Drug Allergies   ICD Specifications Following MD:  Sherryl Manges, MD     ICD Vendor:  [employer]     ICD Model Number:  [phone]     ICD Serial Number:  [id number] ICD DOI:  07/18/2010     ICD Implanting MD:  Sherryl Manges, MD  Lead 1:    Location: RA     DOI: 07/18/2010     Model #: [id number]     Serial #: [id number]     Status: active Lead 2:    Location: RV     DOI: 07/18/2010     Model #: 0865     Serial #: [id number]     Status: active Lead 3:    Location: LV     DOI: 07/18/2010     Model #: 1258T     Serial #: [id number]     Status: active  Indications::  VF  Explantation Comments: See PaceArt  ICD Follow Up ICD Dependent:  No      Episodes Coumadin:  No  Brady Parameters Mode DDD     Lower Rate Limit:  60     Upper Rate Limit 120 PAV 110     Sensed AV Delay:  110  Tachy Zones VF:  240     VT:  200     VT1:  150

## 2011-01-29 ENCOUNTER — Ambulatory Visit (INDEPENDENT_AMBULATORY_CARE_PROVIDER_SITE_OTHER): Payer: Self-pay | Admitting: Cardiovascular Disease

## 2011-01-29 ENCOUNTER — Encounter: Payer: Self-pay | Admitting: Cardiovascular Disease

## 2011-01-29 DIAGNOSIS — I5022 Chronic systolic (congestive) heart failure: Secondary | ICD-10-CM

## 2011-01-29 DIAGNOSIS — I428 Other cardiomyopathies: Secondary | ICD-10-CM

## 2011-02-05 ENCOUNTER — Other Ambulatory Visit: Payer: Self-pay

## 2011-02-05 NOTE — Cardiovascular Report (Signed)
Summary: Office Visit   Office Visit   Imported By: Roderic Ovens 01/29/2011 16:22:00  _____________________________________________________________________  External Attachment:    Type:   Image     Comment:   External Document

## 2011-02-05 NOTE — Assessment & Plan Note (Signed)
Summary: F6M/WPA/TMJ   Visit Type:  Follow-up  CC:  Pain in left chest muscles with lifting.  History of Present Illness: 50 yo AAF with no prior past medical history who was admitted to [facility] in August 2011 after an out of hospital v. fib arrest. She was cooled on the Arctic sun protocol and underwent a cardiac cath which showed no evidence of obstructive CAD. Echo showed EF of 15%. Cardiac MRI showed EF of 29%. 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 She had an ICD implanted by Dr. Graciela Husbands on July 18, 2010.   She is here for folllow up. She tells me that her chest is sore when she lifts boxes at work. She works at [employer] at [employer]. No  SOB, near syncope, syncope, dizziness, orthopnea or PND. She has been taking all of her medications. Overall energy level is down.  This is unchanged over the last 6 months. Mild LE edema.   Current Medications (verified): 1)  Lisinopril 5 Mg Tabs (Lisinopril) .... Take One Tablet By Mouth Once Daily 2)  Carvedilol 6.25 Mg Tabs (Carvedilol) .... Take 1/2 Tablet By Mouth Twice A Day 3)  Klor-Con M20 20 Meq Cr-Tabs (Potassium Chloride Crys Cr) .... Take One Tablet Once Daily 4)  Aspirin 81 Mg Tbec (Aspirin) .... 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 ATRIAL FIBRILLATION-PAROXYSMAL (ICD-427.31) ABORTED CARDIAC ARREST (ACA) (ICD-427.5)    Social History: Reviewed history from 08/15/2010 and no changes required. Former tobacco use, smoked for 14  years-occasional use now No alcohol No illicit drug use-former crack cocaine use last 2008 Single 4 kids Works for [employer]  Review of Systems  The patient denies fatigue, malaise, fever, weight gain/loss, vision loss, decreased hearing, hoarseness, chest pain, palpitations, shortness of breath, prolonged cough, wheezing, sleep apnea, coughing up blood, abdominal pain, blood in stool, nausea, vomiting, diarrhea, heartburn, incontinence, blood in urine, muscle weakness, joint pain, leg swelling, rash, skin lesions, headache, fainting, dizziness, depression, anxiety, enlarged lymph nodes, easy bruising or bleeding, and environmental allergies.    Vital Signs:  Patient profile:   50 year old female Height:      64 inches Weight:      159.50 pounds BMI:     27.48 Pulse rate:   75 / minute BP sitting:   128 / 80 Cuff size:   regular  Vitals Entered By: Caralee Ates CMA (January 29, 2011 3:32 PM)  Physical Exam  General:  General: Well developed, well nourished, NAD Musculoskeletal: Muscle strength 5/5 all ext Psychiatric: Mood and affect normal Neck: No JVD, no carotid bruits, no thyromegaly, no lymphadenopathy. Lungs:Clear bilaterally, no wheezes, rhonci, crackles CV: RRR no murmurs, gallops rubs Abdomen: soft, NT, ND, BS present Extremities: No edema, pulses 2+.     ICD Specifications Following MD:  Sherryl Manges, MD     ICD Vendor:  [name]     ICD Model Number:  [id number]     ICD Serial Number:  [id number] ICD DOI:  07/18/2010     ICD Implanting MD:  Sherryl Manges, MD  Lead 1:    Location: RA     DOI:  07/18/2010     Model #: [id number]     Serial #: [id number]     Status: active Lead 2:    Location: RV     DOI: 07/18/2010     Model #: [id number]     Serial #: [id number]     Status: active Lead 3:    Location: LV     DOI: 07/18/2010     Model #: [id number]     Serial #: [id number]     Status: active  Indications::  VF  Explantation Comments: See PaceArt  ICD Follow Up ICD Dependent:  No        Episodes Coumadin:  No  Brady Parameters Mode DDD     Lower Rate Limit:  60     Upper Rate Limit 120 PAV 110     Sensed AV Delay:  110  Tachy Zones VF:  240     VT:  200     VT1:  150     Impression & Recommendations:  Problem # 1:  SYSTOLIC HEART FAILURE, CHRONIC (ICD-428.22) Will start low dose Lasix 20 mg by mouth Qdaily Continue KCl supplement.   Her updated medication list for this problem includes:    Lisinopril 5 Mg Tabs (Lisinopril) .Marland Kitchen... Take one tablet by mouth once daily    Carvedilol 6.25 Mg Tabs (Carvedilol) .Marland Kitchen... Take 1/2 tablet by mouth twice a day    Aspirin 81 Mg Tbec (Aspirin) .Marland Kitchen... Take one tablet by mouth daily    Furosemide 20 Mg Tabs (Furosemide) .Marland Kitchen... Take one tablet by mouth daily.  Problem # 2:  CARDIOMYOPATHY, DILATED (ICD-425.4) Non-ischemic. Continue current medical management. ICD in place, followed by Dr. Graciela Husbands.   Her updated medication list for this problem includes:    Lisinopril 5 Mg Tabs (Lisinopril) .Marland Kitchen... Take one tablet by mouth once daily    Carvedilol 6.25 Mg Tabs (Carvedilol) .Marland Kitchen... Take 1/2 tablet by mouth twice a day    Aspirin 81 Mg Tbec (Aspirin) .Marland Kitchen... Take one tablet by mouth daily    Furosemide 20 Mg Tabs (Furosemide) .Marland Kitchen... Take one tablet by mouth daily.  Patient Instructions: 1)  Your physician recommends that you schedule a follow-up appointment in: 6 months 2)  Your physician recommends that you return for lab work in 1 week for a BMP. 3)  Your physician has recommended you make the following change in your medication: START LASIX 20mg  by mouth daily.  Prescriptions: FUROSEMIDE 20 MG TABS (FUROSEMIDE) Take one tablet by mouth daily.  #30 x 8   Entered by:   Whitney Maeola Sarah RN   Authorized by:   Verne Carrow, MD   Signed by:   Ellender Hose RN on 01/29/2011   Method used:   Electronically to        [facility] (retail)       [address]       [place], [place]  [id number]       Ph:  [phone]       Fax: [phone]   RxID:   [id number]

## 2011-02-10 ENCOUNTER — Other Ambulatory Visit: Payer: Self-pay | Admitting: Cardiovascular Disease

## 2011-02-10 ENCOUNTER — Encounter: Payer: Self-pay | Admitting: Cardiovascular Disease

## 2011-02-10 ENCOUNTER — Other Ambulatory Visit (INDEPENDENT_AMBULATORY_CARE_PROVIDER_SITE_OTHER): Payer: Self-pay

## 2011-02-10 DIAGNOSIS — I5022 Chronic systolic (congestive) heart failure: Secondary | ICD-10-CM

## 2011-02-11 LAB — BASIC METABOLIC PANEL
BUN: 21 mg/dL (ref 6–23)
CO2: 22 mEq/L (ref 19–32)
Chloride: 108 mEq/L (ref 96–112)
Creatinine, Ser: 1 mg/dL (ref 0.4–1.2)
Potassium: 3.9 mEq/L (ref 3.5–5.1)

## 2011-02-12 LAB — POCT RAPID STREP A (OFFICE): Streptococcus, Group A Screen (Direct): NEGATIVE

## 2011-02-13 LAB — BASIC METABOLIC PANEL
BUN: 10 mg/dL (ref 6–23)
BUN: 10 mg/dL (ref 6–23)
BUN: 11 mg/dL (ref 6–23)
BUN: 6 mg/dL (ref 6–23)
BUN: 8 mg/dL (ref 6–23)
BUN: 9 mg/dL (ref 6–23)
BUN: 10 mg/dL (ref 6–23)
BUN: 11 mg/dL (ref 6–23)
BUN: 11 mg/dL (ref 6–23)
BUN: 12 mg/dL (ref 6–23)
BUN: 9 mg/dL (ref 6–23)
BUN: 9 mg/dL (ref 6–23)
CO2: 15 mEq/L — ABNORMAL LOW (ref 19–32)
CO2: 15 mEq/L — ABNORMAL LOW (ref 19–32)
CO2: 16 mEq/L — ABNORMAL LOW (ref 19–32)
CO2: 21 mEq/L (ref 19–32)
CO2: 22 mEq/L (ref 19–32)
CO2: 16 mEq/L — ABNORMAL LOW (ref 19–32)
CO2: 18 mEq/L — ABNORMAL LOW (ref 19–32)
Calcium: 7.7 mg/dL — ABNORMAL LOW (ref 8.4–10.5)
Calcium: 8.2 mg/dL — ABNORMAL LOW (ref 8.4–10.5)
Calcium: 8 mg/dL — ABNORMAL LOW (ref 8.4–10.5)
Calcium: 8.1 mg/dL — ABNORMAL LOW (ref 8.4–10.5)
Calcium: 8.3 mg/dL — ABNORMAL LOW (ref 8.4–10.5)
Chloride: 105 mEq/L (ref 96–112)
Chloride: 107 mEq/L (ref 96–112)
Chloride: 113 mEq/L — ABNORMAL HIGH (ref 96–112)
Chloride: 115 mEq/L — ABNORMAL HIGH (ref 96–112)
Chloride: 106 mEq/L (ref 96–112)
Chloride: 106 mEq/L (ref 96–112)
Chloride: 108 mEq/L (ref 96–112)
Chloride: 108 mEq/L (ref 96–112)
Chloride: 109 mEq/L (ref 96–112)
Chloride: 111 mEq/L (ref 96–112)
Creatinine, Ser: 0.79 mg/dL (ref 0.4–1.2)
Creatinine, Ser: 0.84 mg/dL (ref 0.4–1.2)
Creatinine, Ser: 0.87 mg/dL (ref 0.4–1.2)
Creatinine, Ser: 0.89 mg/dL (ref 0.4–1.2)
Creatinine, Ser: 0.47 mg/dL (ref 0.4–1.2)
Creatinine, Ser: 0.49 mg/dL (ref 0.4–1.2)
Creatinine, Ser: 0.5 mg/dL (ref 0.4–1.2)
Creatinine, Ser: 0.54 mg/dL (ref 0.4–1.2)
Creatinine, Ser: 0.78 mg/dL (ref 0.4–1.2)
GFR calc Af Amer: >60 mL/min (ref >60)
GFR calc Af Amer: >60 mL/min (ref >60)
GFR calc Af Amer: >60 mL/min (ref >60)
GFR calc Af Amer: >60 mL/min (ref >60)
GFR calc Af Amer: >60 mL/min (ref >60)
GFR calc non Af Amer: >60 mL/min (ref >60)
GFR calc non Af Amer: >60 mL/min (ref >60)
GFR calc non Af Amer: >60 mL/min (ref >60)
GFR calc non Af Amer: >60 mL/min (ref >60)
GFR calc non Af Amer: >60 mL/min (ref >60)
GFR calc non Af Amer: >60 mL/min (ref >60)
GFR calc non Af Amer: >60 mL/min (ref >60)
GFR calc non Af Amer: >60 mL/min (ref >60)
GFR calc non Af Amer: >60 mL/min (ref >60)
Glucose, Bld: 107 mg/dL — ABNORMAL HIGH (ref 70–99)
Glucose, Bld: 75 mg/dL (ref 70–99)
Glucose, Bld: 80 mg/dL (ref 70–99)
Glucose, Bld: 91 mg/dL (ref 70–99)
Glucose, Bld: 92 mg/dL (ref 70–99)
Glucose, Bld: 97 mg/dL (ref 70–99)
Glucose, Bld: 107 mg/dL — ABNORMAL HIGH (ref 70–99)
Glucose, Bld: 111 mg/dL — ABNORMAL HIGH (ref 70–99)
Glucose, Bld: 118 mg/dL — ABNORMAL HIGH (ref 70–99)
Glucose, Bld: 215 mg/dL — ABNORMAL HIGH (ref 70–99)
Potassium: 3.6 mEq/L (ref 3.5–5.1)
Potassium: 3.8 mEq/L (ref 3.5–5.1)
Potassium: 3.8 mEq/L (ref 3.5–5.1)
Potassium: 4 mEq/L (ref 3.5–5.1)
Potassium: 4 mEq/L (ref 3.5–5.1)
Potassium: 2.8 mEq/L — ABNORMAL LOW (ref 3.5–5.1)
Potassium: 2.9 mEq/L — ABNORMAL LOW (ref 3.5–5.1)
Potassium: 3.3 mEq/L — ABNORMAL LOW (ref 3.5–5.1)
Potassium: 3.5 mEq/L (ref 3.5–5.1)
Potassium: 3.5 mEq/L (ref 3.5–5.1)
Potassium: 3.6 mEq/L (ref 3.5–5.1)
Potassium: 3.7 mEq/L (ref 3.5–5.1)
Potassium: 3.7 mEq/L (ref 3.5–5.1)
Sodium: 135 mEq/L (ref 135–145)
Sodium: 136 mEq/L (ref 135–145)
Sodium: 137 mEq/L (ref 135–145)
Sodium: 138 mEq/L (ref 135–145)
Sodium: 138 mEq/L (ref 135–145)
Sodium: 134 mEq/L — ABNORMAL LOW (ref 135–145)
Sodium: 134 mEq/L — ABNORMAL LOW (ref 135–145)
Sodium: 135 mEq/L (ref 135–145)
Sodium: 136 mEq/L (ref 135–145)
Sodium: 136 mEq/L (ref 135–145)
Sodium: 137 mEq/L (ref 135–145)

## 2011-02-13 LAB — CBC
HCT: 30.1 % — ABNORMAL LOW (ref 36.0–46.0)
HCT: 32.3 % — ABNORMAL LOW (ref 36.0–46.0)
HCT: 31.5 % — ABNORMAL LOW (ref 36.0–46.0)
Hemoglobin: 10.6 g/dL — ABNORMAL LOW (ref 12.0–15.0)
Hemoglobin: 9.6 g/dL — ABNORMAL LOW (ref 12.0–15.0)
Hemoglobin: 10.2 g/dL — ABNORMAL LOW (ref 12.0–15.0)
MCH: 24.3 pg — ABNORMAL LOW (ref 26.0–34.0)
MCHC: 31.9 g/dL (ref 30.0–36.0)
MCHC: 32.4 g/dL (ref 30.0–36.0)
MCHC: 32.7 g/dL (ref 30.0–36.0)
MCHC: 32.8 g/dL (ref 30.0–36.0)
MCV: 73.2 fL — ABNORMAL LOW (ref 78.0–100.0)
MCV: 74.4 fL — ABNORMAL LOW (ref 78.0–100.0)
MCV: 76.3 fL — ABNORMAL LOW (ref 78.0–100.0)
Platelets: 260 10*3/uL (ref 150–400)
Platelets: 281 10*3/uL (ref 150–400)
RBC: 4.03 MIL/uL (ref 3.87–5.11)
RDW: 18.3 % — ABNORMAL HIGH (ref 11.5–15.5)
RDW: 18.6 % — ABNORMAL HIGH (ref 11.5–15.5)
RDW: 19.3 % — ABNORMAL HIGH (ref 11.5–15.5)
RDW: 19.2 % — ABNORMAL HIGH (ref 11.5–15.5)
WBC: 12.1 10*3/uL — ABNORMAL HIGH (ref 4.0–10.5)
WBC: 7.9 10*3/uL (ref 4.0–10.5)

## 2011-02-13 LAB — PROTIME-INR
INR: 1.07 (ref 0.00–1.49)
INR: 1.13 (ref 0.00–1.49)
Prothrombin Time: 14.7 seconds (ref 11.6–15.2)

## 2011-02-13 LAB — POCT I-STAT 3, ART BLOOD GAS (G3+)
Acid-base deficit: 7 mmol/L — ABNORMAL HIGH (ref 0.0–2.0)
Patient temperature: 36.3

## 2011-02-13 LAB — URINE MICROSCOPIC-ADD ON

## 2011-02-13 LAB — BLOOD GAS, ARTERIAL
Acid-base deficit: 8.9 mmol/L — ABNORMAL HIGH (ref 0.0–2.0)
Bicarbonate: 17 mEq/L — ABNORMAL LOW (ref 20.0–24.0)
FIO2: 0.4 %
MECHVT: 500 mL
O2 Saturation: 99.3 %
O2 Saturation: 99.6 %
PEEP: 5 cmH2O
Patient temperature: 91.4
RATE: 15 resp/min
TCO2: 15.6 mmol/L (ref 0–100)
pCO2 arterial: 20.8 mmHg — ABNORMAL LOW (ref 35.0–45.0)
pH, Arterial: 7.433 — ABNORMAL HIGH (ref 7.350–7.400)

## 2011-02-13 LAB — URINALYSIS, ROUTINE W REFLEX MICROSCOPIC
Bilirubin Urine: NEGATIVE
Specific Gravity, Urine: 1.02 (ref 1.005–1.030)
Urobilinogen, UA: 1 mg/dL (ref 0.0–1.0)
pH: 6.5 (ref 5.0–8.0)

## 2011-02-13 LAB — COMPREHENSIVE METABOLIC PANEL
ALT: 91 U/L — ABNORMAL HIGH (ref 0–35)
Alkaline Phosphatase: 78 U/L (ref 39–117)
BUN: 20 mg/dL (ref 6–23)
CO2: 12 mEq/L — ABNORMAL LOW (ref 19–32)
GFR calc non Af Amer: 46 mL/min — ABNORMAL LOW (ref >60)
Glucose, Bld: 170 mg/dL — ABNORMAL HIGH (ref 70–99)
Potassium: 3.4 mEq/L — ABNORMAL LOW (ref 3.5–5.1)
Total Bilirubin: 0.5 mg/dL (ref 0.3–1.2)
Total Protein: 6.6 g/dL (ref 6.0–8.3)

## 2011-02-13 LAB — GLUCOSE, CAPILLARY
Glucose-Capillary: 100 mg/dL — ABNORMAL HIGH (ref 70–99)
Glucose-Capillary: 59 mg/dL — ABNORMAL LOW (ref 70–99)
Glucose-Capillary: 68 mg/dL — ABNORMAL LOW (ref 70–99)
Glucose-Capillary: 68 mg/dL — ABNORMAL LOW (ref 70–99)
Glucose-Capillary: 69 mg/dL — ABNORMAL LOW (ref 70–99)
Glucose-Capillary: 90 mg/dL (ref 70–99)
Glucose-Capillary: 90 mg/dL (ref 70–99)
Glucose-Capillary: 102 mg/dL — ABNORMAL HIGH (ref 70–99)
Glucose-Capillary: 106 mg/dL — ABNORMAL HIGH (ref 70–99)
Glucose-Capillary: 127 mg/dL — ABNORMAL HIGH (ref 70–99)
Glucose-Capillary: 228 mg/dL — ABNORMAL HIGH (ref 70–99)
Glucose-Capillary: 234 mg/dL — ABNORMAL HIGH (ref 70–99)
Glucose-Capillary: 237 mg/dL — ABNORMAL HIGH (ref 70–99)
Glucose-Capillary: 97 mg/dL (ref 70–99)
Glucose-Capillary: 98 mg/dL (ref 70–99)

## 2011-02-13 LAB — CALCIUM, IONIZED: Calcium, Ion: 1.11 mmol/L — ABNORMAL LOW (ref 1.12–1.32)

## 2011-02-13 LAB — PREGNANCY, URINE: Preg Test, Ur: NEGATIVE

## 2011-02-13 LAB — CARDIAC PANEL(CRET KIN+CKTOT+MB+TROPI)
CK, MB: 4.1 ng/mL — ABNORMAL HIGH (ref 0.3–4.0)
Total CK: 91 U/L (ref 7–177)

## 2011-02-13 LAB — RAPID URINE DRUG SCREEN, HOSP PERFORMED
Amphetamines: NOT DETECTED
Barbiturates: NOT DETECTED
Opiates: NOT DETECTED
Tetrahydrocannabinol: NOT DETECTED

## 2011-02-13 LAB — APTT: aPTT: 26 seconds (ref 24–37)

## 2011-02-13 LAB — DIFFERENTIAL
Basophils Absolute: 0 10*3/uL (ref 0.0–0.1)
Basophils Relative: 0 % (ref 0–1)
Eosinophils Absolute: 0.1 10*3/uL (ref 0.0–0.7)
Monocytes Relative: 6 % (ref 3–12)
Neutro Abs: 3.2 10*3/uL (ref 1.7–7.7)
Neutrophils Relative %: 40 % — ABNORMAL LOW (ref 43–77)

## 2011-02-13 LAB — PHOSPHORUS
Phosphorus: 3.5 mg/dL (ref 2.3–4.6)
Phosphorus: 2.9 mg/dL (ref 2.3–4.6)

## 2011-02-13 LAB — POCT CARDIAC MARKERS
CKMB, poc: <1 ng/mL — ABNORMAL LOW (ref 1.0–8.0)
Myoglobin, poc: 44.3 ng/mL (ref 12–200)
Troponin i, poc: <0.05 ng/mL (ref 0.00–0.09)

## 2011-02-13 LAB — CK TOTAL AND CKMB (NOT AT ARMC)
CK, MB: 1.6 ng/mL (ref 0.3–4.0)
Relative Index: INVALID (ref 0.0–2.5)
Total CK: 96 U/L (ref 7–177)

## 2011-02-13 LAB — TROPONIN I: Troponin I: 0.06 ng/mL (ref 0.00–0.06)

## 2011-02-13 LAB — POCT I-STAT, CHEM 8
Calcium, Ion: 1.03 mmol/L — ABNORMAL LOW (ref 1.12–1.32)
HCT: 34 % — ABNORMAL LOW (ref 36.0–46.0)
Hemoglobin: 11.6 g/dL — ABNORMAL LOW (ref 12.0–15.0)
Sodium: 142 mEq/L (ref 135–145)
TCO2: 13 mmol/L (ref 0–100)

## 2011-02-13 LAB — URINE CULTURE: Culture: NO GROWTH

## 2011-02-13 LAB — BRAIN NATRIURETIC PEPTIDE: Pro B Natriuretic peptide (BNP): 176 pg/mL — ABNORMAL HIGH (ref 0.0–100.0)

## 2011-02-13 LAB — IRON AND TIBC
Iron: 43 ug/dL (ref 42–135)
TIBC: 308 ug/dL (ref 250–470)

## 2011-02-13 LAB — DIGOXIN LEVEL: Digoxin Level: <0.2 ng/mL — ABNORMAL LOW (ref 0.8–2.0)

## 2011-02-13 LAB — ETHANOL: Alcohol, Ethyl (B): <5 mg/dL (ref 0–10)

## 2011-02-13 LAB — HIV ANTIBODY (ROUTINE TESTING W REFLEX): HIV: NONREACTIVE

## 2011-02-13 LAB — TSH: TSH: 1.525 u[IU]/mL (ref 0.350–4.500)

## 2011-02-25 ENCOUNTER — Telehealth: Payer: Self-pay | Admitting: Cardiovascular Disease

## 2011-02-25 DIAGNOSIS — R0602 Shortness of breath: Secondary | ICD-10-CM

## 2011-02-25 MED ORDER — POTASSIUM CHLORIDE 20 MEQ PO PACK: 20.0000 meq | PACK | Freq: Two times a day (BID) | ORAL | Status: DC

## 2011-02-25 NOTE — Telephone Encounter (Signed)
Refill sent.

## 2011-03-03 ENCOUNTER — Telehealth: Payer: Self-pay | Admitting: Internal Medicine

## 2011-03-03 NOTE — Telephone Encounter (Signed)
LMTCB ./CY 

## 2011-03-05 ENCOUNTER — Ambulatory Visit (INDEPENDENT_AMBULATORY_CARE_PROVIDER_SITE_OTHER): Payer: Self-pay | Admitting: *Deleted

## 2011-03-05 DIAGNOSIS — I428 Other cardiomyopathies: Secondary | ICD-10-CM

## 2011-03-05 NOTE — Progress Notes (Signed)
ICD check, no med list available

## 2011-03-10 ENCOUNTER — Telehealth: Payer: Self-pay | Admitting: Cardiovascular Disease

## 2011-03-10 NOTE — Telephone Encounter (Signed)
SPOKE WITH PT HAD DEVICE CHECKED IN OFFICE WILL CALL IF HAS FURTHER PROBLEMS.

## 2011-03-10 NOTE — Telephone Encounter (Signed)
lvmtcb

## 2011-04-09 NOTE — Telephone Encounter (Signed)
Angela Payne is still complaining of mild chest discomfort mainly while working. Advised her that this is more than likely chest soreness but to contact me if this persists. She is also complaining that her device feels like it has moved. I have made her an appointment to have her device checked on 04/15/11 @ 3:00pm. I informed her that if something wasn't functioning properly then we could obtain a chest xray. Floreen Comber, 5:35 PM

## 2011-04-13 ENCOUNTER — Telehealth: Payer: Self-pay | Admitting: Cardiovascular Disease

## 2011-04-13 NOTE — Telephone Encounter (Signed)
Pt states that her BP is running high today and wants to know if she soul take and extra BP pill today.

## 2011-04-13 NOTE — Telephone Encounter (Signed)
Patient's BP 142/ ?. Unsure of her diastolic BP. She can tell her BP is elevated & she has a H/A. She is taking 5 mg of Lisinopril daily. Advised her to take an extra 5 mg of Lisinopril and call us if her BP does not improve.

## 2011-04-15 ENCOUNTER — Ambulatory Visit (INDEPENDENT_AMBULATORY_CARE_PROVIDER_SITE_OTHER): Payer: Self-pay | Admitting: *Deleted

## 2011-04-15 DIAGNOSIS — I5022 Chronic systolic (congestive) heart failure: Secondary | ICD-10-CM

## 2011-04-15 DIAGNOSIS — I428 Other cardiomyopathies: Secondary | ICD-10-CM

## 2011-04-15 DIAGNOSIS — I4901 Ventricular fibrillation: Secondary | ICD-10-CM

## 2011-04-16 ENCOUNTER — Encounter: Payer: Self-pay | Admitting: *Deleted
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
## 2011-04-17 NOTE — Discharge Summary (Signed)
[place]  Patient:    Angela Payne, Angela Payne                      MRN: [id number] Adm. Date:  [id number] Disc. Date: [id number] Attending:  Erasmo Leventhal Dictator:   Della Goo, P.A.-C.                           Discharge Summary  ADMITTING DIAGNOSES: 1. Right distal tibia fracture. 2. Twelve week intrauterine pregnancy.  DISCHARGE DIAGNOSES: 1. Right distal tibia fracture. 2. Twelve week intrauterine pregnancy. 3. Cocaine abuse.  PROCEDURE:  The patient underwent closed reduction and casting of the right distal tibia fracture by Dr. Thomasena Edis.  CONSULTATIONS:  Social services.  BRIEF HISTORY:  The patient is a 50 year old black female who is known to be [redacted] weeks pregnant and fell on the day of admission fracturing the right leg. She had no report of loss of consciousness or other injury.  Exam showed significant pain of the right lower extremity, and x-rays revealed spiral distal one-third tibia fracture with medial displacement.  The patient would ordinarily be a candidate for IM nailing of the fracture; however, due to her pregnancy and high risk for complications of the pregnancy, she was treated with closed reduction and casting.  BRIEF HOSPITAL COURSE:  The closed reduction and casting occurred in the emergency room by Dr. Thomasena Edis, and the patient was given Vicodin for her discomfort.  After full discussion with the anesthesiologist, it was felt that the patient was too high risk for surgical intervention.  She was placed at bed rest for neurovascular checks and physical therapy consult for ambulation. Her urine drug screen was positive for cocaine abuse.  A social service consult was called.  The patient gave history of long-term crack cocaine use of approximately nine years.  She also was noted to have three previous pregnancies, all of which resulted in a viable delivery.  She has a 15-year-old, a 34-year-old, and a  49 year old, all living with other family members.  She was advised in receiving assistance for her drug abuse problem and also advised to follow-up with family planning for prenatal care and an application for Medicaid was performed for her during this hospital stay.  The patient was started on physical therapy to assist with nonweightbearing ambulation utilizing crutches.  Equipment was made available to the patient. She utilized Vicodin as needed for pain.  She would not agree to continued activity on her second hospital day, and therefore, on the third hospital day, she was advised she would need to be discharged to home.  She was in agreement that she could manage at home, and her boyfriend would be available to help her.  During the hospital stay, her cast was intact, and she was moving her toes well with good capillary refill.  She was afebrile with vital signs stable.  There were no obvious complications during the hospital stay.  CONDITION ON DISCHARGE:  Stable.  PERTINENT LABORATORY VALUES:  Admission urinalysis showed 15 ketones, 1.0 urobilinogen, small leukocyte esterase.  Few epithelial cells were noted with 0-5 WBCs and rare bacterial.  Alcohol level was less than 10.  CMET showed sodium 134, calcium 8.0, albumin 3.3, with other values within normal limits. Her urine drug screen was positive for cocaine.  CBC on admission was hemoglobin 11.1, hematocrit 32.9, with remaining values within normal limits.  PLAN:  The patient is  being discharged to her home.  She will have assistance by family members.  She has been given instructions to follow up with family planning for prenatal care and to also seek assistance for her drug abuse problem one week in the office and has been given the number to call to make this appointment.  She is to be strict nonweightbearing on the right lower extremity and will continue elevation at home.  She has been given a wheelchair and walker for  home use.  She has been advised to keep the cast clean and dry at all times and continue to elevated when at rest.  She is given a prescription for Vicodin #30, 1 every 4-6 hours as needed for pain with no addition refills.  If she has questions or concerns prior to her return office visit, she has been advised to call our office and has been given the number. DD:  10/03/00 TD:  10/04/00 Job: [id number]

## 2011-04-17 NOTE — Op Note (Signed)
NAMEKIEARA, SCHWARK                ACCOUNT NO.:  [id number]   MEDICAL RECORD NO.:  [id number]          PATIENT TYPE:  AMB   LOCATION:  DSC                          FACILITY:  [facility]   PHYSICIAN:  Lucky Cowboy, MD         DATE OF BIRTH:  February 16, 1961   DATE OF PROCEDURE:  10/08/2006  DATE OF DISCHARGE:  10/08/2006                               OPERATIVE REPORT   PREOPERATIVE DIAGNOSIS:  Mandibular fracture with maxillomandibular  hardware in place.   POSTOPERATIVE DIAGNOSIS:  Mandibular fracture with maxillomandibular  hardware in place.   PROCEDURE:  Removal of maxillomandibular hardware.   SURGEON:  Dr. Lucky Cowboy.   ANESTHESIA:  General.   ESTIMATED BLOOD LOSS:  Less than 20 mL.   SPECIMENS:  None.   COMPLICATIONS:  None.   INDICATIONS:  The patient is a 50 year old female who sustained  mandibular fractures requiring open reduction internal fixation.  She  also had the bolt maxillomandibular fixation system applied.  For this  reason, she is taken to the operating room for removal.   PROCEDURE:  The patient was taken to the operating room and placed on  the table in the supine position.  Her wires were cut.  These were  interdental wires.  After placing her under general endotracheal  anesthesia, 1% lidocaine with 1:100,000 epinephrine was used to inject  the area around the four bolts.  Bovie cautery is used to divide the  tissues overlying the four bolts and the screwdriver used to remove the  screws.  The areas were copiously irrigated with normal saline and  reapproximated in a running locking stitch.  This was done superiorly  and inferiorly.  The patient was then awakened from anesthesia and taken  to the Post Anesthesia Care Unit in stable condition.  No complications.      Lucky Cowboy, MD  Electronically Signed     SJ/MEDQ  D:  11/21/2006  T:  11/22/2006  Job:  [id number]

## 2011-04-21 ENCOUNTER — Emergency Department (HOSPITAL_COMMUNITY)
Admission: EM | Admit: 2011-04-21 | Discharge: 2011-04-21 | Disposition: A | Discharge status: Home / Self Care | Payer: Self-pay | Attending: Emergency Medicine | Admitting: Emergency Medicine

## 2011-04-21 DIAGNOSIS — I10 Essential (primary) hypertension: Secondary | ICD-10-CM | POA: Insufficient documentation

## 2011-04-21 DIAGNOSIS — J3489 Other specified disorders of nose and nasal sinuses: Secondary | ICD-10-CM | POA: Insufficient documentation

## 2011-04-21 DIAGNOSIS — J029 Acute pharyngitis, unspecified: Secondary | ICD-10-CM | POA: Insufficient documentation

## 2011-04-21 DIAGNOSIS — J329 Chronic sinusitis, unspecified: Secondary | ICD-10-CM | POA: Insufficient documentation

## 2011-04-21 DIAGNOSIS — R51 Headache: Secondary | ICD-10-CM | POA: Insufficient documentation

## 2011-04-21 DIAGNOSIS — R0982 Postnasal drip: Secondary | ICD-10-CM | POA: Insufficient documentation

## 2011-04-21 DIAGNOSIS — R059 Cough, unspecified: Secondary | ICD-10-CM | POA: Insufficient documentation

## 2011-04-21 DIAGNOSIS — R05 Cough: Secondary | ICD-10-CM | POA: Insufficient documentation

## 2011-04-21 DIAGNOSIS — R42 Dizziness and giddiness: Secondary | ICD-10-CM | POA: Insufficient documentation

## 2011-04-22 ENCOUNTER — Encounter: Payer: Self-pay | Admitting: Cardiovascular Disease

## 2011-04-22 ENCOUNTER — Telehealth: Payer: Self-pay | Admitting: Cardiovascular Disease

## 2011-04-22 NOTE — Telephone Encounter (Signed)
LMOM for pt to call back to to verify coreg dose. CTuck

## 2011-04-22 NOTE — Telephone Encounter (Signed)
Refill of carvedilol, khlor kon an lisinopril [facility] [address] needs extra  refills

## 2011-04-23 ENCOUNTER — Ambulatory Visit (INDEPENDENT_AMBULATORY_CARE_PROVIDER_SITE_OTHER): Payer: Self-pay | Admitting: Cardiovascular Disease

## 2011-04-23 ENCOUNTER — Encounter: Payer: Self-pay | Admitting: Cardiovascular Disease

## 2011-04-23 VITALS — BP 142/86 | HR 66 | Resp 17 | Ht 63.0 in | Wt 155.0 lb

## 2011-04-23 DIAGNOSIS — I428 Other cardiomyopathies: Secondary | ICD-10-CM

## 2011-04-23 NOTE — Progress Notes (Signed)
History of Present Illness:49 yo AAF with no prior past medical history who was admitted to [facility] in August 2011 after an out of hospital v. fib arrest. She was cooled on the Arctic sun protocol and underwent a cardiac cath which showed no evidence of obstructive CAD. Echo showed EF of 15%. Cardiac MRI showed EF of 29%. 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 She had an ICD implanted by Dr. Graciela Husbands on July 18, 2010.   She is here for folllow up. She works at [employer] at [employer]. 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 No  SOB, near syncope, syncope, dizziness, orthopnea or PND. She has been taking all of her medications. Overall energy level is down.  This is unchanged over the last 6 months. Mild LE edema.   Past Medical History  Diagnosis Date  . Other, mixed, or unspecified nondependent drug abuse, unspecified   . Other left bundle branch block   . Automatic implantable cardiac defibrillator in situ   . Chronic systolic heart failure   . Anemia, unspecified   . Paroxysmal supraventricular tachycardia   . Other primary cardiomyopathies     EF of 29% by MRI 06/2010, nonischemic  . Atrial fibrillation   . Cardiac arrest     Past Surgical History  Procedure Date  . Cardiac defibrillator placement     [facility] CD [id number] Q defib, serial number [id number]    Current Outpatient Prescriptions  Medication Sig Dispense Refill  . aspirin 81 MG tablet Take 81 mg by mouth daily.        . carvedilol (COREG) 25 MG tablet Take 25 mg by mouth daily.        . furosemide (LASIX) 20 MG tablet Take 20 mg by mouth 2 (two) times daily.        Marland Kitchen lisinopril (PRINIVIL,ZESTRIL) 5 MG tablet Take 5 mg by mouth daily.        . potassium chloride SA (K-DUR,KLOR-CON) 20 MEQ tablet Take 20 mEq by mouth daily.        Marland Kitchen DISCONTD: potassium chloride (KLOR-CON) 20 MEQ packet Take 20 mEq by mouth 2 (two) times daily.  60 packet  11     No Known Allergies  History   Social History  . Marital Status: Divorced    Spouse Name: N/A    Number of Children: 4  . Years of Education: N/A   Occupational History  . [employer]     [employer]   Social History Main Topics  . Smoking status: Former Games developer  . Smokeless tobacco: Not on file   Comment: quit 2 weeks ago  . Alcohol Use: Not on file  . Drug Use: No     history of cocaine abuse last used 2008  . Sexually Active: Not on file   Other Topics Concern  . Not on file   Social History Narrative  . No narrative on file    No family history on file.  Review of Systems:  As stated in the HPI and otherwise negative.   BP 142/86  Pulse 66  Resp 17  Ht 5\' 3"  (1.6 m)  Wt 155 lb (70.308 kg)  BMI 27.46 kg/m2  Physical Examination: General: Well developed, well nourished, NAD HEENT: OP clear, mucus membranes moist SKIN: warm, dry. No rashes. Neuro: No focal deficits Musculoskeletal: Muscle strength 5/5 all ext Psychiatric: Mood and affect normal Neck: No JVD, no carotid bruits,  no thyromegaly, no lymphadenopathy. Lungs:Clear bilaterally, no wheezes, rhonci, crackles Cardiovascular: Regular rate and rhythm. No murmurs, gallops or rubs. Abdomen:Soft. Bowel sounds present. Non-tender.  Extremities: No lower extremity edema. Pulses are 2 + in the bilateral DP/PT.  EKG:

## 2011-04-23 NOTE — Assessment & Plan Note (Signed)
Stable. Her volume status is normal. Chest pain is atypical. She is known to have normal coronary arteries by cath last year. No further workup. Continue current meds.

## 2011-04-23 NOTE — Patient Instructions (Signed)
Your physician recommends that you schedule a follow-up appointment in: 6 months   Your physician has requested that you have an echocardiogram. Echocardiography is a painless test that uses sound waves to create images of your heart. It provides your doctor with information about the size and shape of your heart and how well your heart's chambers and valves are working. This procedure takes approximately one hour. There are no restrictions for this procedures. In 6 months.

## 2011-04-28 NOTE — Telephone Encounter (Signed)
Refill on all medication- need multiple refills. [facility] [address] [phone].

## 2011-04-29 ENCOUNTER — Other Ambulatory Visit: Payer: Self-pay | Admitting: Cardiology

## 2011-04-29 DIAGNOSIS — I2510 Atherosclerotic heart disease of native coronary artery without angina pectoris: Secondary | ICD-10-CM

## 2011-04-29 MED ORDER — LISINOPRIL 5 MG PO TABS: 5.0000 mg | ORAL_TABLET | Freq: Every day | ORAL | Status: DC

## 2011-04-29 MED ORDER — CARVEDILOL 25 MG PO TABS: 25.0000 mg | ORAL_TABLET | Freq: Every day | ORAL | Status: DC

## 2011-04-29 NOTE — Telephone Encounter (Signed)
Refill on carvedilol, lisinopril, extra refills. [facility] [address] [phone]

## 2011-05-14 ENCOUNTER — Other Ambulatory Visit: Payer: Self-pay | Admitting: Family Medicine

## 2011-05-14 DIAGNOSIS — Z1231 Encounter for screening mammogram for malignant neoplasm of breast: Secondary | ICD-10-CM

## 2011-05-19 ENCOUNTER — Telehealth: Payer: Self-pay | Admitting: Cardiovascular Disease

## 2011-05-19 NOTE — Telephone Encounter (Signed)
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

## 2011-05-19 NOTE — Telephone Encounter (Signed)
Pt called and wants nurse to call her, would give no message for me to give to you.

## 2011-05-20 ENCOUNTER — Encounter: Payer: Self-pay | Admitting: Cardiology

## 2011-05-20 NOTE — Telephone Encounter (Signed)
Will leave note at the front.

## 2011-05-20 NOTE — Telephone Encounter (Signed)
We can give Shonica a note saying she can't do excessvie lifting or bending at work. Thanks, chris

## 2011-05-26 ENCOUNTER — Ambulatory Visit (HOSPITAL_COMMUNITY)
Admission: RE | Admit: 2011-05-26 | Discharge: 2011-05-26 | Disposition: A | Discharge status: Home / Self Care | Payer: Self-pay | Source: Ambulatory Visit | Attending: Family Medicine | Admitting: Family Medicine

## 2011-05-26 ENCOUNTER — Other Ambulatory Visit (HOSPITAL_COMMUNITY): Payer: Self-pay | Admitting: Family Medicine

## 2011-05-26 DIAGNOSIS — Z1231 Encounter for screening mammogram for malignant neoplasm of breast: Secondary | ICD-10-CM

## 2011-06-29 ENCOUNTER — Telehealth: Payer: Self-pay | Admitting: Cardiovascular Disease

## 2011-06-29 NOTE — Telephone Encounter (Signed)
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 I informed her that she would need to have her disability paperwork faxed to our office. She was working at [employer] full time but states that she has been on a "break" and now she knows her symptoms of chest discomfort were from working.

## 2011-06-29 NOTE — Telephone Encounter (Signed)
Agree. Thanks, chris 

## 2011-06-29 NOTE — Telephone Encounter (Signed)
Pt wants to talk to you and would not tell me why

## 2011-07-12 ENCOUNTER — Inpatient Hospital Stay (INDEPENDENT_AMBULATORY_CARE_PROVIDER_SITE_OTHER)
Admission: RE | Admit: 2011-07-12 | Discharge: 2011-07-12 | Disposition: A | Discharge status: Home / Self Care | Payer: Self-pay | Source: Ambulatory Visit | Attending: Family Medicine | Admitting: Family Medicine

## 2011-07-12 DIAGNOSIS — K5289 Other specified noninfective gastroenteritis and colitis: Secondary | ICD-10-CM

## 2011-07-15 ENCOUNTER — Ambulatory Visit (INDEPENDENT_AMBULATORY_CARE_PROVIDER_SITE_OTHER): Payer: Self-pay | Admitting: Internal Medicine

## 2011-07-15 ENCOUNTER — Encounter: Payer: Self-pay | Admitting: Internal Medicine

## 2011-07-15 DIAGNOSIS — I4901 Ventricular fibrillation: Secondary | ICD-10-CM

## 2011-07-15 DIAGNOSIS — I5022 Chronic systolic (congestive) heart failure: Secondary | ICD-10-CM

## 2011-07-15 DIAGNOSIS — Z95810 Presence of automatic (implantable) cardiac defibrillator: Secondary | ICD-10-CM

## 2011-07-15 DIAGNOSIS — I428 Other cardiomyopathies: Secondary | ICD-10-CM

## 2011-07-15 NOTE — Patient Instructions (Signed)
Your physician recommends that you schedule a follow-up appointment in: 3 months with [facility] Gunnar Fusi.  Your physician recommends that you continue on your current medications as directed. Please refer to the Current Medication list given to you today.

## 2011-07-15 NOTE — Assessment & Plan Note (Signed)
contniue current meds  Consider aldosterone antagonism

## 2011-07-15 NOTE — Assessment & Plan Note (Signed)
The patient's device was interrogated.  The information was reviewed. No changes were made in the programming.    

## 2011-07-15 NOTE — Progress Notes (Signed)
  HPI  Angela Payne is a 50 y.o. female seen following aborted cardiac arrest which occurred August 2011. She is found to have a nonischemic cardiomyopathy with an ejection fraction of 15%;  She also has LBBB  She is here for folllow up. She tells me that her chest is sore when she lifts boxes at work. She works at [employer] at [employer]. No SOB, near syncope, syncope, dizziness, orthopnea or PND, LE edema. She has been taking all of her medications. Overall energy level is down. She is not interested in trying a beta blocker exchange trial.   She is doing ok without significant sob but nonspecific weakness and atypical chest pain    Past Medical History  Diagnosis Date  . Other, mixed, or unspecified nondependent drug abuse, unspecified   . Other left bundle branch block   . Automatic implantable cardiac defibrillator in situ   . Chronic systolic heart failure   . Anemia, unspecified   . Paroxysmal supraventricular tachycardia   . Other primary cardiomyopathies     EF of 29% by MRI 06/2010, nonischemic  . Atrial fibrillation   . Cardiac arrest     Past Surgical History  Procedure Date  . Cardiac defibrillator placement     [facility] CD Q323020 Q defib, serial number [id number]    Current Outpatient Prescriptions  Medication Sig Dispense Refill  . aspirin 81 MG tablet Take 81 mg by mouth daily.        . carvedilol (COREG) 25 MG tablet 12.5 mg 2 (two) times daily with a meal.        . furosemide (LASIX) 20 MG tablet Take 20 mg by mouth 2 (two) times daily.        Marland Kitchen lisinopril (PRINIVIL,ZESTRIL) 5 MG tablet Take 1 tablet (5 mg total) by mouth daily.  30 tablet  6  . potassium chloride SA (K-DUR,KLOR-CON) 20 MEQ tablet Take 20 mEq by mouth daily.          No Known Allergies  Review of Systems negative except from HPI and PMH  Physical Exam Well developed and well nourished in no acute distress HENT normal E scleral and icterus clear Neck Supple JVP flat; carotids brisk and  full Clear to ausculation Regular rate and rhythm, no murmurs gallops or rub Soft with active bowel sounds No clubbing cyanosis and edema Alert and oriented, grossly normal motor and sensory function Skin Warm and Dry   Assessment and  Plan

## 2011-07-15 NOTE — Assessment & Plan Note (Signed)
Stable

## 2011-07-15 NOTE — Assessment & Plan Note (Signed)
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

## 2011-07-30 ENCOUNTER — Encounter: Payer: Self-pay | Admitting: Cardiology

## 2011-07-30 ENCOUNTER — Ambulatory Visit (INDEPENDENT_AMBULATORY_CARE_PROVIDER_SITE_OTHER): Payer: Self-pay | Admitting: Cardiovascular Disease

## 2011-07-30 ENCOUNTER — Encounter: Payer: Self-pay | Admitting: Cardiovascular Disease

## 2011-07-30 VITALS — BP 128/74 | HR 83 | Resp 12 | Ht 63.0 in | Wt 162.0 lb

## 2011-07-30 DIAGNOSIS — I420 Dilated cardiomyopathy: Secondary | ICD-10-CM

## 2011-07-30 DIAGNOSIS — I428 Other cardiomyopathies: Secondary | ICD-10-CM

## 2011-07-30 DIAGNOSIS — I5023 Acute on chronic systolic (congestive) heart failure: Secondary | ICD-10-CM

## 2011-07-30 MED ORDER — FUROSEMIDE 20 MG PO TABS: 40.0000 mg | ORAL_TABLET | Freq: Two times a day (BID) | ORAL | Status: DC

## 2011-07-30 MED ORDER — FUROSEMIDE 40 MG PO TABS: ORAL_TABLET | ORAL | Status: DC

## 2011-07-30 NOTE — Assessment & Plan Note (Signed)
Volume up today. Will increase Lasix to 40 mg po BID. Continue KCl. I will have her come back next week for a BMET.

## 2011-07-30 NOTE — Assessment & Plan Note (Signed)
She is on good medical therapy. ICD followed by Dr. Graciela Husbands.

## 2011-07-30 NOTE — Progress Notes (Signed)
History of Present Illness: 50 yo AAF with h/o NICM, prior VF arrest August 2011, PAF here today for cardiac follow up. She was admitted to [facility] August 2011 after an out of hospital V. Fib arrest.  She was cooled on the Arctic sun protocol and underwent a cardiac cath which showed no evidence of obstructive CAD. Echo showed EF of 15%. Cardiac MRI showed EF of 29%. 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 She had an ICD implanted by Dr. Graciela Husbands on July 18, 2010. I last saw her in May 2012. She has had chronic chest pain since her cardiac arrest.   She is here for folllow up. She She works at [employer] at [employer]. She thinks her ICD has shifted and this is occasionally painful. No exertional chest pain. She describes dyspnea and recent weight gain of 10 lbs over last month. No near syncope, syncope, dizziness, orthopnea or PND. She has been taking all of her medications. Overall energy level is down.    Past Medical History  Diagnosis Date  . Other, mixed, or unspecified nondependent drug abuse, unspecified   . Other left bundle branch block   . Automatic implantable cardiac defibrillator in situ   . Chronic systolic heart failure   . Anemia, unspecified   . Paroxysmal supraventricular tachycardia   . Other primary cardiomyopathies     EF of 29% by MRI 06/2010, nonischemic  . Atrial fibrillation   . Cardiac arrest     Past Surgical History  Procedure Date  . Cardiac defibrillator placement     [facility] CD [id number] Q defib, serial number [id number]    Current Outpatient Prescriptions  Medication Sig Dispense Refill  . aspirin 81 MG tablet Take 81 mg by mouth daily.        . carvedilol (COREG) 25 MG tablet 12.5 mg 2 (two) times daily with a meal.        . furosemide (LASIX) 20 MG tablet Take 20 mg by mouth 2 (two) times daily.        Marland Kitchen lisinopril (PRINIVIL,ZESTRIL) 5 MG tablet Take 1 tablet (5 mg total) by mouth daily.  30 tablet   6  . PARoxetine (PAXIL) 10 MG tablet Take 10 mg by mouth every morning.        . potassium chloride SA (K-DUR,KLOR-CON) 20 MEQ tablet Take 20 mEq by mouth daily.        . promethazine (PHENERGAN) 25 MG tablet Take 25 mg by mouth as needed.        . traMADol (ULTRAM) 50 MG tablet Take 50 mg by mouth as needed.          No Known Allergies  History   Social History  . Marital Status: Single    Spouse Name: N/A    Number of Children: 4  . Years of Education: N/A   Occupational History  . [employer]     [employer]   Social History Main Topics  . Smoking status: Former Games developer  . Smokeless tobacco: Not on file   Comment: quit 2 weeks ago  . Alcohol Use: Not on file  . Drug Use: No     history of cocaine abuse last used 2008  . Sexually Active: Not on file   Other Topics Concern  . Not on file   Social History Narrative  . No narrative on file    No family history on file.  Review of Systems:  As stated in the HPI and otherwise negative.   BP 128/74  Ht 5\' 3"  (1.6 m)  Wt 162 lb (73.483 kg)  BMI 28.70 kg/m2  Physical Examination: General: Well developed, well nourished, NAD HEENT: OP clear, mucus membranes moist SKIN: warm, dry. No rashes. Neuro: No focal deficits Musculoskeletal: Muscle strength 5/5 all ext Psychiatric: Mood and affect normal Neck: No JVD, no carotid bruits, no thyromegaly, no lymphadenopathy. Lungs:Clear bilaterally, no wheezes, rhonci, crackles Cardiovascular: Regular rate and rhythm. No murmurs, gallops or rubs. Abdomen:Soft. Bowel sounds present. Non-tender.  Extremities: No lower extremity edema. Pulses are 2 + in the bilateral DP/PT.  EKG:NSR, rate 83 bpm. Poor R wave progression precordial leads.

## 2011-07-30 NOTE — Patient Instructions (Signed)
Your physician recommends that you schedule a follow-up appointment in: 3 months  Your physician has recommended you make the following change in your medication: INCREASE LASIX to 40 mg twice daily.  Please return to the office next week for blood work.

## 2011-08-04 ENCOUNTER — Telehealth: Payer: Self-pay | Admitting: Cardiovascular Disease

## 2011-08-04 NOTE — Telephone Encounter (Signed)
Pt rtn call she missed today

## 2011-08-04 NOTE — Telephone Encounter (Signed)
Pt calling re numbness in hands, low bp

## 2011-08-04 NOTE — Telephone Encounter (Signed)
Pt to call pcp regarding these symptoms.

## 2011-08-04 NOTE — Telephone Encounter (Signed)
Pt states she has been having numbness right hand x 1 week and throbbing and tingling of her right fingers x one week.  It is off and on but seems to be worse in the evening when she is resting.  When she moves her hand and fingers the symptoms subside.  Laying on her hand makes it worse.  Holding her fingers up seems to improve the symptoms.  She states that her heart rate seems to go up when she has the numbness and tingling.  Angela Payne is also complaining of a "hot feeling" all over her entire body x 2-3 weeks.  They are off and on day and night and last 1-2 seconds.  She states she thought it might be menopausal symptoms but now doesn't think so.  Last LMP was  3 months ago.

## 2011-08-06 ENCOUNTER — Other Ambulatory Visit (INDEPENDENT_AMBULATORY_CARE_PROVIDER_SITE_OTHER): Payer: Self-pay | Admitting: *Deleted

## 2011-08-06 DIAGNOSIS — I428 Other cardiomyopathies: Secondary | ICD-10-CM

## 2011-08-06 DIAGNOSIS — I420 Dilated cardiomyopathy: Secondary | ICD-10-CM

## 2011-08-07 LAB — BASIC METABOLIC PANEL
CO2: 24 mEq/L (ref 19–32)
Calcium: 8.8 mg/dL (ref 8.4–10.5)
Chloride: 108 mEq/L (ref 96–112)
Potassium: 4.2 mEq/L (ref 3.5–5.1)
Sodium: 141 mEq/L (ref 135–145)

## 2011-08-12 ENCOUNTER — Encounter: Payer: Self-pay | Admitting: *Deleted

## 2011-08-14 ENCOUNTER — Telehealth: Payer: Self-pay | Admitting: Cardiovascular Disease

## 2011-08-14 NOTE — Telephone Encounter (Signed)
Spoke with pt and gave her lab results from 08/06/11

## 2011-08-14 NOTE — Telephone Encounter (Signed)
Pt calling re lab work results

## 2011-08-21 ENCOUNTER — Inpatient Hospital Stay (INDEPENDENT_AMBULATORY_CARE_PROVIDER_SITE_OTHER)
Admission: RE | Admit: 2011-08-21 | Discharge: 2011-08-21 | Disposition: A | Discharge status: Home / Self Care | Payer: Self-pay | Source: Ambulatory Visit | Attending: Family Medicine | Admitting: Family Medicine

## 2011-08-21 DIAGNOSIS — G5600 Carpal tunnel syndrome, unspecified upper limb: Secondary | ICD-10-CM

## 2011-08-21 DIAGNOSIS — M654 Radial styloid tenosynovitis [de Quervain]: Secondary | ICD-10-CM

## 2011-10-05 ENCOUNTER — Emergency Department (HOSPITAL_COMMUNITY)
Admission: EM | Admit: 2011-10-05 | Discharge: 2011-10-05 | Discharge status: Against Medical Advice | Payer: Self-pay | Attending: Emergency Medicine | Admitting: Emergency Medicine

## 2011-10-05 DIAGNOSIS — L02239 Carbuncle of trunk, unspecified: Secondary | ICD-10-CM | POA: Insufficient documentation

## 2011-10-05 DIAGNOSIS — L02229 Furuncle of trunk, unspecified: Secondary | ICD-10-CM | POA: Insufficient documentation

## 2011-10-05 NOTE — ED Notes (Signed)
Pt called x 2 without answer.

## 2011-10-15 ENCOUNTER — Ambulatory Visit (INDEPENDENT_AMBULATORY_CARE_PROVIDER_SITE_OTHER): Payer: Self-pay | Admitting: *Deleted

## 2011-10-15 ENCOUNTER — Encounter: Payer: Self-pay | Admitting: Internal Medicine

## 2011-10-15 DIAGNOSIS — I4901 Ventricular fibrillation: Secondary | ICD-10-CM

## 2011-10-15 DIAGNOSIS — I5022 Chronic systolic (congestive) heart failure: Secondary | ICD-10-CM

## 2011-10-15 LAB — ICD DEVICE OBSERVATION
AL THRESHOLD: 0.625 V
ATRIAL PACING ICD: 4.3 pct
BAMS-0001: 170 {beats}/min
DEVICE MODEL ICD: 621486
FVT: 0
PACEART VT: 0
RV LEAD AMPLITUDE: 12 mv
RV LEAD IMPEDENCE ICD: 537.5 Ohm
TOT-0009: 1
TOT-0010: 6
TZAT-0001SLOWVT: 1
TZAT-0019SLOWVT: 7.5 V
TZAT-0020SLOWVT: 1 ms
TZON-0004SLOWVT: 35
TZON-0005SLOWVT: 6
TZON-0010SLOWVT: 40 ms
TZST-0001SLOWVT: 2
TZST-0001SLOWVT: 4
TZST-0003SLOWVT: 650 V
TZST-0003SLOWVT: 890 V

## 2011-10-15 NOTE — Progress Notes (Signed)
ICD check with ICM 

## 2011-10-19 ENCOUNTER — Ambulatory Visit: Payer: Self-pay | Admitting: Cardiovascular Disease

## 2011-11-10 ENCOUNTER — Telehealth: Payer: Self-pay | Admitting: Cardiovascular Disease

## 2011-11-10 ENCOUNTER — Encounter: Payer: Self-pay | Admitting: Cardiovascular Disease

## 2011-11-10 NOTE — Telephone Encounter (Signed)
New Msg: Pt calling asking that nurse return pt call. Please return pt call to discuss further.

## 2011-11-10 NOTE — Telephone Encounter (Signed)
Letter has been written. cdm 

## 2011-11-10 NOTE — Telephone Encounter (Signed)
Spoke with pt. She is requesting a letter stating her cardiac history and what her current diagnosis is.  I told pt we could write this for her. She would like to pick it up in office. Will write letter and leave at front desk for pt to pick up.

## 2011-11-10 NOTE — Telephone Encounter (Signed)
Letter left at front desk

## 2011-11-10 NOTE — Telephone Encounter (Signed)
New Msg: Pt calling asking to speak with nurse. Please return pt call to discuss further.

## 2011-12-03 ENCOUNTER — Encounter: Payer: Self-pay | Admitting: Internal Medicine

## 2011-12-21 ENCOUNTER — Encounter: Payer: Self-pay | Admitting: *Deleted

## 2011-12-21 ENCOUNTER — Telehealth: Payer: Self-pay | Admitting: Cardiovascular Disease

## 2011-12-21 NOTE — Telephone Encounter (Signed)
Spoke with pt. She needs letter from July 30, 2011 updated as her case has now come up for review.  I told pt letter would be written and left at front desk for her to pick up.

## 2011-12-21 NOTE — Telephone Encounter (Signed)
New Problem  Patient would like Nurse PA to return call to her at hm# to confirm she received the envelope she left for her at the front desk.

## 2011-12-21 NOTE — Telephone Encounter (Signed)
Walk in Pt Form " pt Needs Date Changed On Letter" sent to Pat/[place] 12/21/11/KM

## 2011-12-22 ENCOUNTER — Telehealth: Payer: Self-pay | Admitting: Cardiovascular Disease

## 2011-12-22 ENCOUNTER — Emergency Department (HOSPITAL_COMMUNITY): Payer: Self-pay

## 2011-12-22 ENCOUNTER — Other Ambulatory Visit: Payer: Self-pay

## 2011-12-22 ENCOUNTER — Encounter (HOSPITAL_COMMUNITY): Payer: Self-pay | Admitting: *Deleted

## 2011-12-22 ENCOUNTER — Emergency Department (HOSPITAL_COMMUNITY)
Admission: EM | Admit: 2011-12-22 | Discharge: 2011-12-22 | Disposition: A | Discharge status: Home / Self Care | Payer: Self-pay | Attending: Emergency Medicine | Admitting: Emergency Medicine

## 2011-12-22 DIAGNOSIS — Z79899 Other long term (current) drug therapy: Secondary | ICD-10-CM | POA: Insufficient documentation

## 2011-12-22 DIAGNOSIS — R5381 Other malaise: Secondary | ICD-10-CM | POA: Insufficient documentation

## 2011-12-22 DIAGNOSIS — D649 Anemia, unspecified: Secondary | ICD-10-CM | POA: Insufficient documentation

## 2011-12-22 DIAGNOSIS — Z95810 Presence of automatic (implantable) cardiac defibrillator: Secondary | ICD-10-CM | POA: Insufficient documentation

## 2011-12-22 DIAGNOSIS — I4891 Unspecified atrial fibrillation: Secondary | ICD-10-CM | POA: Insufficient documentation

## 2011-12-22 DIAGNOSIS — R079 Chest pain, unspecified: Secondary | ICD-10-CM

## 2011-12-22 DIAGNOSIS — Z8674 Personal history of sudden cardiac arrest: Secondary | ICD-10-CM | POA: Insufficient documentation

## 2011-12-22 DIAGNOSIS — Z7982 Long term (current) use of aspirin: Secondary | ICD-10-CM | POA: Insufficient documentation

## 2011-12-22 DIAGNOSIS — I428 Other cardiomyopathies: Secondary | ICD-10-CM | POA: Insufficient documentation

## 2011-12-22 DIAGNOSIS — R0789 Other chest pain: Secondary | ICD-10-CM | POA: Insufficient documentation

## 2011-12-22 DIAGNOSIS — I5022 Chronic systolic (congestive) heart failure: Secondary | ICD-10-CM | POA: Insufficient documentation

## 2011-12-22 DIAGNOSIS — R5383 Other fatigue: Secondary | ICD-10-CM | POA: Insufficient documentation

## 2011-12-22 DIAGNOSIS — R0602 Shortness of breath: Secondary | ICD-10-CM | POA: Insufficient documentation

## 2011-12-22 LAB — CBC
Hemoglobin: 11.1 g/dL — ABNORMAL LOW (ref 12.0–15.0)
MCH: 26.6 pg (ref 26.0–34.0)
MCV: 79.6 fL (ref 78.0–100.0)
RBC: 4.17 MIL/uL (ref 3.87–5.11)
WBC: 10.5 10*3/uL (ref 4.0–10.5)

## 2011-12-22 LAB — POCT I-STAT TROPONIN I

## 2011-12-22 LAB — HEPATIC FUNCTION PANEL
ALT: 9 U/L (ref 0–35)
AST: 20 U/L (ref 0–37)
Albumin: 3.7 g/dL (ref 3.5–5.2)
Bilirubin, Direct: <0.1 mg/dL (ref 0.0–0.3)

## 2011-12-22 LAB — POCT I-STAT, CHEM 8
BUN: 18 mg/dL (ref 6–23)
Calcium, Ion: 1.22 mmol/L (ref 1.12–1.32)
Chloride: 107 mEq/L (ref 96–112)
Creatinine, Ser: 1.1 mg/dL (ref 0.50–1.10)
Sodium: 143 mEq/L (ref 135–145)
TCO2: 24 mmol/L (ref 0–100)

## 2011-12-22 MED ORDER — SODIUM CHLORIDE 0.9 % IV SOLN
Freq: Once | INTRAVENOUS | Status: AC
  Administered 2011-12-22: 16:00:00 via INTRAVENOUS

## 2011-12-22 NOTE — ED Notes (Signed)
Pt reports starting a couple hours she had tightness under bilateral breasts. Pt reports she has intermittent tightness. States hx of MI and defib. States her heart muscles are weak. Pt reports diaphoresis when she has the sharp pain. Pt reports poor short term memory after MI.

## 2011-12-22 NOTE — Telephone Encounter (Signed)
Pt having chest pain off and on for about 2 hours now, some SOB but states she has had that [prior to today, pls call [phone]

## 2011-12-22 NOTE — Consult Note (Signed)
Reason for Consult:Chest pain Referring Physician: Doylene Bode  Angela Payne is an 51 y.o. female.  HPI:  Angela Payne is a 51 year old female with a history of a non-ischemic cardiopathy. Ejection fraction was 50%. She was admitted with an out of hospital ventricular fibrillation cardiac arrest. He was thought to be due to cocaine abuse. She had an ICD placed.  She had a cardiac catheterization which revealed smooth and normal coronary arteries.  She started having episodes of chest pain week or so ago. These episodes typically occur with physical upper body exertion. They last for several minutes.They typically resolve when she relaxes.   Past Medical History  Diagnosis Date  . Other, mixed, or unspecified nondependent drug abuse, unspecified   . Other left bundle branch block   . Automatic implantable cardiac defibrillator in situ   . Chronic systolic heart failure   . Anemia, unspecified   . Paroxysmal supraventricular tachycardia   . Other primary cardiomyopathies     EF of 29% by MRI 06/2010, nonischemic  . Atrial fibrillation   . Cardiac arrest     Past Surgical History  Procedure Date  . Cardiac defibrillator placement     [facility] CD Q323020 Q defib, serial number [id number]    History reviewed. No pertinent family history.  Social History:  reports that she has quit smoking. She does not have any smokeless tobacco history on file. She reports that she does not drink alcohol or use illicit drugs.  Medications: Current Facility-Administered Medications  Medication Dose Route Frequency Provider Last Rate Last Dose  . 0.9 %  sodium chloride infusion   Intravenous Once Benny Lennert, MD 50 mL/hr at 12/22/11 1553     Current Outpatient Prescriptions  Medication Sig Dispense Refill  . aspirin 81 MG tablet Take 81 mg by mouth daily.        . carvedilol (COREG) 25 MG tablet Take 12.5 mg by mouth daily.       . furosemide (LASIX) 40 MG tablet Take 40 mg by mouth 2 (two)  times daily. Take one tablet by mouth twice daily.      Marland Kitchen lisinopril (PRINIVIL,ZESTRIL) 5 MG tablet Take 5 mg by mouth daily.      . potassium chloride SA (K-DUR,KLOR-CON) 20 MEQ tablet Take 20 mEq by mouth 2 (two) times daily.       . promethazine (PHENERGAN) 25 MG tablet Take 25 mg by mouth every 8 (eight) hours as needed. FOR NAUSEa      . traMADol (ULTRAM) 50 MG tablet Take 50 mg by mouth every 6 (six) hours as needed. FOR PAIN           . sodium chloride   Intravenous Once      Allergies: No Known Allergies  Results for orders placed during the hospital encounter of 12/22/11 (from the past 48 hour(s))  CBC     Status: Abnormal   Collection Time   12/22/11  1:20 PM      Component Value Range Comment   WBC 10.5  4.0 - 10.5 (K/uL)    RBC 4.17  3.87 - 5.11 (MIL/uL)    Hemoglobin 11.1 (*) 12.0 - 15.0 (g/dL)    HCT 16.1 (*) 09.6 - 46.0 (%)    MCV 79.6  78.0 - 100.0 (fL)    MCH 26.6  26.0 - 34.0 (pg)    MCHC 33.4  30.0 - 36.0 (g/dL)    RDW 04.5  40.9 - 81.1 (%)  Platelets 296  150 - 400 (K/uL)   POCT I-STAT TROPONIN I     Status: Normal   Collection Time   12/22/11  1:51 PM      Component Value Range Comment   Troponin i, poc 0.00  0.00 - 0.08 (ng/mL)    Comment 3            POCT I-STAT, CHEM 8     Status: Normal   Collection Time   12/22/11  1:52 PM      Component Value Range Comment   Sodium 143  135 - 145 (mEq/L)    Potassium 3.6  3.5 - 5.1 (mEq/L)    Chloride 107  96 - 112 (mEq/L)    BUN 18  6 - 23 (mg/dL)    Creatinine, Ser 1.61  0.50 - 1.10 (mg/dL)    Glucose, Bld 96  70 - 99 (mg/dL)    Calcium, Ion 0.96  1.12 - 1.32 (mmol/L)    TCO2 24  0 - 100 (mmol/L)    Hemoglobin 12.6  12.0 - 15.0 (g/dL)    HCT 04.5  40.9 - 81.1 (%)   HEPATIC FUNCTION PANEL     Status: Normal   Collection Time   12/22/11  3:40 PM      Component Value Range Comment   Total Protein 8.0  6.0 - 8.3 (g/dL)    Albumin 3.7  3.5 - 5.2 (g/dL)    AST 20  0 - 37 (U/L)    ALT 9  0 - 35 (U/L)     Alkaline Phosphatase 80  39 - 117 (U/L)    Total Bilirubin 0.4  0.3 - 1.2 (mg/dL)    Bilirubin, Direct <9.1  0.0 - 0.3 (mg/dL) REPEATED TO VERIFY   Indirect Bilirubin NOT CALCULATED  0.3 - 0.9 (mg/dL)   LIPASE, BLOOD     Status: Normal   Collection Time   12/22/11  3:40 PM      Component Value Range Comment   Lipase 50  11 - 59 (U/L)     Dg Chest 2 View  12/22/2011  *RADIOLOGY REPORT*  Clinical Data: Chest pain, tightness.  Cold chills.  History of MI.  CHEST - 2 VIEW  Comparison: 02/07/2006  Findings: Has left-sided AICD.  Leads overlie the right atrium, right ventricle, coronary sinus.  There is mildly enlarged.  No evidence for pulmonary edema.  There are no focal consolidations. No definite pleural effusions.  No pulmonary edema.  Mild bibasilar atelectasis identified. Visualized osseous structures have a normal appearance.  IMPRESSION:  1.  Interval placement of AICD. 2.  No evidence for pneumothorax. 3.  Mild cardiomegaly without pulmonary edema.  Original Report Authenticated By: Patterson Hammersmith, M.D.    @ROS @ Blood pressure 126/86, pulse 71, temperature 98 F (36.7 C), temperature source Oral, resp. rate 18, SpO2 100.00%. The patient is alert and oriented x 3.  The mood and affect are normal.   Skin: warm and dry.  Color is normal.    HEENT:   the sclera are nonicteric.  The mucous membranes are moist.  The carotids are 2+ without bruits.  There is no thyromegaly.  There is no JVD.    Lungs: clear.  The chest wall is mildly tender   Heart: regular rate with a normal S1 and S2.  There are no murmurs, gallops, or rubs. The PMI is not displaced.     Abdomen: good bowel sounds.  There is no guarding or rebound.  There is no hepatosplenomegaly or tenderness.  There are no masses.   Extremities:  no clubbing, cyanosis, or edema.  The legs are without rashes.  The distal pulses are intact.   Neuro:  Cranial nerves II - XII are intact.  Motor and sensory functions are intact.     ECG:  NSR, V-paced  Assessment/Plan:  1. Chest pain:  Angela Payne presents with atypical episodes of chest pains have been present for the past week or so. Her current troponin level is 0. She's had a cardiac catheterization which reveals smooth and normal coronary arteries. I don't think that this is likely to be ischemic in origin.  The remaining labs including a lipase level, hepatic profile and chemistry profile are still pending. If these are negative then I think that she could be discharged to home. These labs are abnormal then she'll need to be seen by the internal medicine team.  2. Anemia: Her hemoglobin is mildly depressed. She's been anemic in the past. This will be worked up by her general medical Dr.  Vesta Mixer, Montez Hageman., MD, [facility] 12/22/2011, 6:24 PM

## 2011-12-22 NOTE — ED Notes (Signed)
Report given to [facility], pt will be transported to [facility]

## 2011-12-22 NOTE — Telephone Encounter (Signed)
Returned call to pt. She has been having "crenching" chest pain since 9am. She states the pain is constant and increases in intensity like labor pains.  Pain is under breast bilat.  The pt reports she is always short of breath but it has gotten worse today. She is speaking in full sentences while on the phone. Denies LE or mid-section swelling. Pt is available to come to the clinic today. Advised go to the nearest Emergency Room. Pt states she is going to the Emergency Room right now. [facility] ED Triage Nurse [facility] aware of pt. She will notify the charge nurse. Trish notified.

## 2011-12-22 NOTE — ED Provider Notes (Addendum)
History     CSN: [id number]  Arrival date & time 12/22/11  1200   First MD Initiated Contact with Patient 12/22/11 [phone]      Chief Complaint  Patient presents with  . Chest Pain    (Consider location/radiation/quality/duration/timing/severity/associated sxs/prior treatment) Patient is a 51 y.o. female presenting with chest pain. The history is provided by the patient (Patient states that she's had about 3 episodes of chest pain over last couple days which lasts approximately 10 minutes she gets diaphoretic some shortness of breath this occurs and feels weak).  Chest Pain The chest pain began yesterday. Chest pain occurs frequently. The chest pain is resolved. The pain is associated with stress. At its most intense, the pain is at 5/10. The pain is currently at 0/10. The quality of the pain is described as dull. The pain does not radiate. Chest pain is worsened by stress. Primary symptoms include fatigue. Pertinent negatives for primary symptoms include no fever, no cough and no abdominal pain.  Pertinent negatives for past medical history include no seizures.     Past Medical History  Diagnosis Date  . Other, mixed, or unspecified nondependent drug abuse, unspecified   . Other left bundle branch block   . Automatic implantable cardiac defibrillator in situ   . Chronic systolic heart failure   . Anemia, unspecified   . Paroxysmal supraventricular tachycardia   . Other primary cardiomyopathies     EF of 29% by MRI 06/2010, nonischemic  . Atrial fibrillation   . Cardiac arrest     Past Surgical History  Procedure Date  . Cardiac defibrillator placement     [facility] CD Q323020 Q defib, serial number [id number]    History reviewed. No pertinent family history.  History  Substance Use Topics  . Smoking status: Former Games developer  . Smokeless tobacco: Not on file   Comment: quit 2 weeks ago  . Alcohol Use: No    OB History    Grav Para Term Preterm Abortions TAB SAB Ect Mult  Living                  Review of Systems  Constitutional: Positive for fatigue. Negative for fever.  HENT: Negative for congestion, sinus pressure and ear discharge.   Eyes: Negative for discharge.  Respiratory: Negative for cough.   Cardiovascular: Positive for chest pain.  Gastrointestinal: Negative for abdominal pain and diarrhea.  Genitourinary: Negative for frequency and hematuria.  Musculoskeletal: Negative for back pain.  Skin: Negative for rash.  Neurological: Negative for seizures and headaches.  Hematological: Negative.   Psychiatric/Behavioral: Negative for hallucinations.    Allergies  Review of patient's allergies indicates no known allergies.  Home Medications   Current Outpatient Rx  Name Route Sig Dispense Refill  . ASPIRIN 81 MG PO TABS Oral Take 81 mg by mouth daily.      Marland Kitchen CARVEDILOL 25 MG PO TABS Oral Take 12.5 mg by mouth daily.     . FUROSEMIDE 40 MG PO TABS Oral Take 40 mg by mouth 2 (two) times daily. Take one tablet by mouth twice daily.    Marland Kitchen LISINOPRIL 5 MG PO TABS Oral Take 5 mg by mouth daily.    Marland Kitchen POTASSIUM CHLORIDE CRYS ER 20 MEQ PO TBCR Oral Take 20 mEq by mouth 2 (two) times daily.     Marland Kitchen PROMETHAZINE HCL 25 MG PO TABS Oral Take 25 mg by mouth every 8 (eight) hours as needed. FOR NAUSEa    .  TRAMADOL HCL 50 MG PO TABS Oral Take 50 mg by mouth every 6 (six) hours as needed. FOR PAIN      BP 129/82  Pulse 68  Temp 98.3 F (36.8 C)  Resp 15  SpO2 99%  Physical Exam  Constitutional: She is oriented to person, place, and time. She appears well-developed.  HENT:  Head: Normocephalic and atraumatic.  Eyes: Conjunctivae and EOM are normal. No scleral icterus.  Neck: Neck supple. No thyromegaly present.  Cardiovascular: Normal rate and regular rhythm.  Exam reveals no gallop and no friction rub.   No murmur heard. Pulmonary/Chest: No stridor. She has no wheezes. She has no rales. She exhibits no tenderness.  Abdominal: She exhibits no  distension. There is no tenderness. There is no rebound.  Musculoskeletal: Normal range of motion. She exhibits no edema.  Lymphadenopathy:    She has no cervical adenopathy.  Neurological: She is oriented to person, place, and time. Coordination normal.  Skin: No rash noted. No erythema.  Psychiatric: She has a normal mood and affect. Her behavior is normal.    ED Course  Procedures (including critical care time)  Labs Reviewed  CBC - Abnormal; Notable for the following:    Hemoglobin 11.1 (*)    HCT 33.2 (*)    All other components within normal limits  POCT I-STAT, CHEM 8  POCT I-STAT TROPONIN I  HEPATIC FUNCTION PANEL  LIPASE, BLOOD  I-STAT TROPONIN I  I-STAT, CHEM 8   Dg Chest 2 View  12/22/2011  *RADIOLOGY REPORT*  Clinical Data: Chest pain, tightness.  Cold chills.  History of MI.  CHEST - 2 VIEW  Comparison: 02/07/2006  Findings: Has left-sided AICD.  Leads overlie the right atrium, right ventricle, coronary sinus.  There is mildly enlarged.  No evidence for pulmonary edema.  There are no focal consolidations. No definite pleural effusions.  No pulmonary edema.  Mild bibasilar atelectasis identified. Visualized osseous structures have a normal appearance.  IMPRESSION:  1.  Interval placement of AICD. 2.  No evidence for pneumothorax. 3.  Mild cardiomegaly without pulmonary edema.  Original Report Authenticated By: Patterson Hammersmith, M.D.     No diagnosis found.   Date: 12/22/2011  Rate: 66  Rhythm paced rhythm  QRS Axis: normal  Intervals: normal  ST/T Wave abnormalities: nonspecific ST changes  Conduction Disutrbances:nonspecific intraventricular conduction delay  Narrative Interpretation:   Old EKG Reviewed: unchanged    MDM    Chest pain cardiology to see pt.   Patient was seen by cardiology and they felt the patient could go home and be followed up as an outpatient.  Benny Lennert, MD 12/22/11 1653  Benny Lennert, MD 12/22/11 1924

## 2011-12-24 ENCOUNTER — Telehealth: Payer: Self-pay | Admitting: Cardiovascular Disease

## 2011-12-24 NOTE — Telephone Encounter (Signed)
Left message to call back  

## 2011-12-24 NOTE — Telephone Encounter (Signed)
FU Call: Pt returning call from Pat. Please return pt call to discuss further.  

## 2011-12-24 NOTE — Telephone Encounter (Signed)
New problem Pt wanted to talk to you wouldn't give details please call her back

## 2011-12-24 NOTE — Telephone Encounter (Signed)
Spoke with pt. She is asking if Dr. [name] thinks she needs to be started on albuterol.  Pt was seen in ED on December 22, 2011 and I told her it would have been started at that time if needed.  She still has shortness of breath but it has not increased since ED visit.  I told her this medication was usually prescribed by primary care or pulmonary.  Pt has appt with Dr. [name] on Feb. 7, 2013.

## 2012-01-07 ENCOUNTER — Encounter: Payer: Self-pay | Admitting: Cardiovascular Disease

## 2012-01-07 ENCOUNTER — Ambulatory Visit (INDEPENDENT_AMBULATORY_CARE_PROVIDER_SITE_OTHER): Payer: Medicaid Other | Admitting: Cardiovascular Disease

## 2012-01-07 VITALS — BP 115/77 | HR 73 | Wt 164.0 lb

## 2012-01-07 DIAGNOSIS — I5022 Chronic systolic (congestive) heart failure: Secondary | ICD-10-CM

## 2012-01-07 DIAGNOSIS — Z95810 Presence of automatic (implantable) cardiac defibrillator: Secondary | ICD-10-CM

## 2012-01-07 DIAGNOSIS — I428 Other cardiomyopathies: Secondary | ICD-10-CM

## 2012-01-07 NOTE — Patient Instructions (Signed)
Your physician wants you to follow-up in:  12 months.  You will receive a reminder letter in the mail two months in advance. If you don't receive a letter, please call our office to schedule the follow-up appointment.   

## 2012-01-07 NOTE — Progress Notes (Signed)
History of Present Illness: 51 yo AAF with h/o NICM, prior VF arrest August 2011, PAF here today for cardiac follow up. She was admitted to [facility] August 2011 after an out of hospital V. Fib arrest. She was cooled on the Arctic sun protocol and underwent a cardiac cath which showed no evidence of obstructive CAD. Echo showed EF of 15%. Cardiac MRI showed EF of 29%. 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 She had an ICD implanted by Dr. Graciela Husbands on July 18, 2010. I last saw her in August 2012. She has had chronic chest pain since her cardiac arrest.   She is here for folllow up. She works at [employer] at [employer]. She thinks her ICD has shifted and this is occasionally painful. No exertional chest pain. She describes dyspnea at times. No change in weight. No near syncope, syncope, dizziness, orthopnea or PND. She has been taking all of her medications. Overall energy level is down. She has constant fatigue.    Past Medical History  Diagnosis Date  . Other, mixed, or unspecified nondependent drug abuse, unspecified   . Other left bundle branch block   . Automatic implantable cardiac defibrillator in situ   . Chronic systolic heart failure   . Anemia, unspecified   . Paroxysmal supraventricular tachycardia   . Other primary cardiomyopathies     EF of 29% by MRI 06/2010, nonischemic  . Atrial fibrillation   . Cardiac arrest     Past Surgical History  Procedure Date  . Cardiac defibrillator placement     [facility] [place] CD Q323020 Q defib, serial number [id number]    Current Outpatient Prescriptions  Medication Sig Dispense Refill  . aspirin 81 MG tablet Take 81 mg by mouth daily.        . carvedilol (COREG) 25 MG tablet Take 12.5 mg by mouth daily.       . furosemide (LASIX) 40 MG tablet Take one tablet by mouth twice daily.      Marland Kitchen lisinopril (PRINIVIL,ZESTRIL) 5 MG tablet Take 5 mg by mouth daily.      . potassium chloride SA  (K-DUR,KLOR-CON) 20 MEQ tablet Take 20 mEq by mouth 2 (two) times daily.       . promethazine (PHENERGAN) 25 MG tablet Take 25 mg by mouth every 8 (eight) hours as needed. FOR NAUSEa      . traMADol (ULTRAM) 50 MG tablet Take 50 mg by mouth every 6 (six) hours as needed. FOR PAIN        No Known Allergies  History   Social History  . Marital Status: Divorced    Spouse Name: N/A    Number of Children: 4  . Years of Education: N/A   Occupational History  . [employer]     [employer]   Social History Main Topics  . Smoking status: Former Games developer  . Smokeless tobacco: Not on file   Comment: quit 2 weeks ago  . Alcohol Use: No  . Drug Use: No     history of cocaine abuse last used 2008  . Sexually Active: Not on file   Other Topics Concern  . Not on file   Social History Narrative  . No narrative on file    No family history on file.  Review of Systems:  As stated in the HPI and otherwise negative.   BP 115/77  Pulse 73  Wt 164 lb (74.39 kg)  Physical Examination: General:  Well developed, well nourished, NAD HEENT: OP clear, mucus membranes moist SKIN: warm, dry. No rashes. Neuro: No focal deficits Musculoskeletal: Muscle strength 5/5 all ext Psychiatric: Mood and affect normal Neck: No JVD, no carotid bruits, no thyromegaly, no lymphadenopathy. Lungs:Clear bilaterally, no wheezes, rhonci, crackles Cardiovascular: Regular rate and rhythm. No murmurs, gallops or rubs. Abdomen:Soft. Bowel sounds present. Non-tender.  Extremities: No lower extremity edema. Pulses are 2 + in the bilateral DP/PT.

## 2012-01-07 NOTE — Assessment & Plan Note (Signed)
Dr. Graciela Husbands follows her ICD. She is due for a check in 2 weeks.

## 2012-01-07 NOTE — Assessment & Plan Note (Signed)
Volume status is normal today. Weight has been stable. Will continue Lasix BID and potassium supplementation.

## 2012-01-07 NOTE — Assessment & Plan Note (Signed)
Non-ischemic in etiology. She is on good medical therapy. Blood pressure is well controlled. She will need lipids checked at follow up appt. No changes in therapy today.

## 2012-01-08 ENCOUNTER — Telehealth: Payer: Self-pay | Admitting: Cardiovascular Disease

## 2012-01-08 NOTE — Telephone Encounter (Signed)
Spoke with pt.  She wants to let Dr. [name] know she will be quitting work soon

## 2012-01-08 NOTE — Telephone Encounter (Signed)
Call her 

## 2012-01-11 ENCOUNTER — Other Ambulatory Visit: Payer: Self-pay | Admitting: Cardiovascular Disease

## 2012-01-11 MED ORDER — POTASSIUM CHLORIDE CRYS ER 20 MEQ PO TBCR: 20.0000 meq | EXTENDED_RELEASE_TABLET | Freq: Two times a day (BID) | ORAL | Status: DC

## 2012-01-11 MED ORDER — CARVEDILOL 25 MG PO TABS: 12.5000 mg | ORAL_TABLET | Freq: Every day | ORAL | Status: DC

## 2012-01-11 MED ORDER — FUROSEMIDE 40 MG PO TABS: 40.0000 mg | ORAL_TABLET | Freq: Two times a day (BID) | ORAL | Status: DC

## 2012-01-11 MED ORDER — LISINOPRIL 5 MG PO TABS: 5.0000 mg | ORAL_TABLET | Freq: Every day | ORAL | Status: DC

## 2012-01-11 NOTE — Telephone Encounter (Signed)
New Problem:      Patient called in needing carvedilol (COREG) 25 MG tablet 12.5 mg, furosemide (LASIX) 40 MG tablet, lisinopril (PRINIVIL,ZESTRIL) 5 MG tablet 5 mg, potassium chloride SA (K-DUR,KLOR-CON) 20 MEQ tablet

## 2012-01-12 NOTE — Telephone Encounter (Signed)
Dr. McAlhany aware. 

## 2012-01-20 ENCOUNTER — Ambulatory Visit (INDEPENDENT_AMBULATORY_CARE_PROVIDER_SITE_OTHER): Payer: Medicaid Other | Admitting: *Deleted

## 2012-01-20 ENCOUNTER — Encounter: Payer: Self-pay | Admitting: Internal Medicine

## 2012-01-20 DIAGNOSIS — I4901 Ventricular fibrillation: Secondary | ICD-10-CM

## 2012-01-20 DIAGNOSIS — I428 Other cardiomyopathies: Secondary | ICD-10-CM

## 2012-01-20 DIAGNOSIS — I5022 Chronic systolic (congestive) heart failure: Secondary | ICD-10-CM

## 2012-01-20 LAB — ICD DEVICE OBSERVATION
AL AMPLITUDE: 4.5 mv
AL THRESHOLD: 0.625 V
ATRIAL PACING ICD: 3.6 pct
BAMS-0001: 170 {beats}/min
DEVICE MODEL ICD: 621486
FVT: 0
LV LEAD IMPEDENCE ICD: 850 Ohm
LV LEAD THRESHOLD: 2 V
MODE SWITCH EPISODES: 0
PACEART VT: 0
RV LEAD AMPLITUDE: 12 mv
RV LEAD THRESHOLD: 0.625 V
TZAT-0001SLOWVT: 1
TZAT-0004SLOWVT: 8
TZAT-0018SLOWVT: NEGATIVE
TZON-0003SLOWVT: 300 ms
TZON-0010SLOWVT: 40 ms
TZST-0001SLOWVT: 2
TZST-0001SLOWVT: 3
TZST-0001SLOWVT: 5
TZST-0003SLOWVT: 650 V
TZST-0003SLOWVT: 890 V
VENTRICULAR PACING ICD: 99.69 pct
VF: 0

## 2012-01-20 NOTE — Progress Notes (Signed)
ICD check with ICM 

## 2012-01-26 ENCOUNTER — Emergency Department (HOSPITAL_COMMUNITY)
Admission: EM | Admit: 2012-01-26 | Discharge: 2012-01-26 | Disposition: A | Discharge status: Home Health | Payer: Self-pay | Source: Home / Self Care | Attending: Family Medicine | Admitting: Family Medicine

## 2012-01-26 ENCOUNTER — Encounter (HOSPITAL_COMMUNITY): Payer: Self-pay | Admitting: *Deleted

## 2012-01-26 DIAGNOSIS — T148XXA Other injury of unspecified body region, initial encounter: Secondary | ICD-10-CM

## 2012-01-26 DIAGNOSIS — W57XXXA Bitten or stung by nonvenomous insect and other nonvenomous arthropods, initial encounter: Secondary | ICD-10-CM

## 2012-01-26 DIAGNOSIS — T148 Other injury of unspecified body region: Secondary | ICD-10-CM

## 2012-01-26 NOTE — ED Provider Notes (Addendum)
History     CSN: [id number]  Arrival date & time 01/26/12  1233   First MD Initiated Contact with Patient 01/26/12 1314      Chief Complaint  Patient presents with  . Rash    (Consider location/radiation/quality/duration/timing/severity/associated sxs/prior treatment) Patient is a 51 y.o. female presenting with rash. The history is provided by the patient.  Rash  This is a new problem. The current episode started more than 1 week ago. The problem has not changed since onset.The problem is associated with nothing. There has been no fever. The rash is present on the right arm, left arm and face. The patient is experiencing no pain. Associated symptoms include itching. Pertinent negatives include no blisters, no pain and no weeping. She has tried anti-itch cream for the symptoms.    Past Medical History  Diagnosis Date  . Other, mixed, or unspecified nondependent drug abuse, unspecified   . Other left bundle branch block   . Automatic implantable cardiac defibrillator in situ   . Chronic systolic heart failure   . Anemia, unspecified   . Paroxysmal supraventricular tachycardia   . Other primary cardiomyopathies     EF of 29% by MRI 06/2010, nonischemic  . Atrial fibrillation   . Cardiac arrest     Past Surgical History  Procedure Date  . Cardiac defibrillator placement     [facility] CD Q323020 Q defib, serial number [id number]    History reviewed. No pertinent family history.  History  Substance Use Topics  . Smoking status: Former Games developer  . Smokeless tobacco: Not on file   Comment: quit 2 weeks ago  . Alcohol Use: No    OB History    Grav Para Term Preterm Abortions TAB SAB Ect Mult Living                  Review of Systems  Constitutional: Negative.   Skin: Positive for itching and rash.    Allergies  Review of patient's allergies indicates no known allergies.  Home Medications   Current Outpatient Rx  Name Route Sig Dispense Refill  . ASPIRIN 81 MG PO  TABS Oral Take 81 mg by mouth daily.      Marland Kitchen CARVEDILOL 25 MG PO TABS Oral Take 0.5 tablets (12.5 mg total) by mouth daily. 15 tablet 3  . FUROSEMIDE 40 MG PO TABS Oral Take 1 tablet (40 mg total) by mouth 2 (two) times daily. Take one tablet by mouth twice daily. 60 tablet 1  . LISINOPRIL 5 MG PO TABS Oral Take 1 tablet (5 mg total) by mouth daily. 30 tablet 6  . POTASSIUM CHLORIDE CRYS ER 20 MEQ PO TBCR Oral Take 1 tablet (20 mEq total) by mouth 2 (two) times daily. 60 tablet 3  . PROMETHAZINE HCL 25 MG PO TABS Oral Take 25 mg by mouth every 8 (eight) hours as needed. FOR NAUSEa    . TRAMADOL HCL 50 MG PO TABS Oral Take 50 mg by mouth every 6 (six) hours as needed. FOR PAIN      BP 117/77  Pulse 70  Temp(Src) 98.5 F (36.9 C) (Oral)  Resp 16  SpO2 100%  Physical Exam  Nursing note and vitals reviewed. Constitutional: She appears well-developed and well-nourished.  Skin: Skin is warm and dry. Rash noted.       ED Course  Procedures (including critical care time)  Labs Reviewed - No data to display No results found.   1. Multiple insect bites  MDM          Barkley Bruns, MD 01/26/12 1527  Barkley Bruns, MD 01/30/12 1440  Barkley Bruns, MD 01/30/12 1441  Linna Hoff, MD 02/13/12 [phone]

## 2012-01-26 NOTE — ED Notes (Signed)
Itchy , raised, red rash on both arms and left foot and face  X 2 weeks

## 2012-01-26 NOTE — ED Notes (Signed)
Call from [facility], [address]---[phone]--- stating that they only carry Cutivate cream in 0.05 %, and not the 0.1% ordered.  OK to change the strength per Dr Konrad Saha

## 2012-01-27 ENCOUNTER — Telehealth (HOSPITAL_COMMUNITY): Payer: Self-pay | Admitting: *Deleted

## 2012-01-27 NOTE — ED Notes (Signed)
Pt. called on VM @ 1234 and said she would like to be referred to a dermatologist. I called pt. back and she asked what dermatologist takes the orange card. I told her none of them do. She would be considered self pay and probably have to pay something up front. She asked if [facility] has a dermatologist. I suggested she call them and ask. If they do the referral they might wave the up front fee or she may get a lower fee. I told her to call back if she needs me to do anything further.Angela Payne 01/27/2012

## 2012-01-29 ENCOUNTER — Encounter: Payer: Self-pay | Admitting: Obstetrics & Gynecology

## 2012-02-03 ENCOUNTER — Telehealth (HOSPITAL_COMMUNITY): Payer: Self-pay | Admitting: *Deleted

## 2012-02-03 NOTE — ED Notes (Addendum)
Pt. called on VM x 2 and wants a referral to a dermatologist. I called pt. back and told her the diagnosis was bug bites we would not do a referral for that. She said she went to [facility] and they told her it was Pityriasis. I told her that was a virus that would run its course in 6 weeks and she would not need a referral for that. I told her since she goes to [facility] she should have them do the referral because they have specialists that don't require a $250.00 payment up front. Pt. said she would do this. Vassie Moselle 02/03/2012

## 2012-02-18 ENCOUNTER — Telehealth: Payer: Self-pay | Admitting: Cardiovascular Disease

## 2012-02-18 NOTE — Telephone Encounter (Signed)
Spoke with pt who reports she saw Dr. Margo Aye and has been started on 7 day course of prednisone for itching due to skin irritation. She states Dr. Margo Aye reviewed all her meds and is aware of her medical history.  I told her it should be OK to take. She will monitor heart rate and blood pressure and call if problems.

## 2012-02-18 NOTE — Telephone Encounter (Signed)
New Msg: Pt calling wanting to speak with nurse regarding pt medication. Please return pt call to discuss further.

## 2012-03-02 ENCOUNTER — Ambulatory Visit (INDEPENDENT_AMBULATORY_CARE_PROVIDER_SITE_OTHER): Payer: Medicaid Other | Admitting: Obstetrics & Gynecology

## 2012-03-02 ENCOUNTER — Encounter: Payer: Self-pay | Admitting: Obstetrics & Gynecology

## 2012-03-02 VITALS — BP 109/74 | HR 69 | Temp 97.4°F | Ht 63.0 in | Wt 165.4 lb

## 2012-03-02 DIAGNOSIS — Z309 Encounter for contraceptive management, unspecified: Secondary | ICD-10-CM

## 2012-03-02 NOTE — Progress Notes (Signed)
  Subjective:    Patient ID: Angela Payne, female    DOB: December 22, 1960, 51 y.o.   MRN: [id number]  HPI  Angela Payne is a 51 yo M AA G4P4 who is here to discuss birth control. She had a Mirena placed last year and she is happy that she doesn't have periods but she has become very worried after seeing the lawyer commercials regarding Mirena. She is now convinced that she wants no foreign objects in her body. She initially came in today requesting a BTL, but I have explained that she is not a good anesthesia/surgical candidate due to her heart disease. I have also explained that with a BTL  titanium clips are left in the body and with Essure permanent metal structures are left in the tubes. I have recommended to her and her husband that he have a vasectomy. He states that he is willing to do that.  Review of Systems     Objective:   Physical Exam        Assessment & Plan:  Desires permanent sterility. Vasectomy for her husband

## 2012-03-28 ENCOUNTER — Other Ambulatory Visit: Payer: Self-pay | Admitting: *Deleted

## 2012-03-28 ENCOUNTER — Other Ambulatory Visit: Payer: Self-pay | Admitting: Cardiovascular Disease

## 2012-03-28 DIAGNOSIS — I428 Other cardiomyopathies: Secondary | ICD-10-CM

## 2012-03-28 MED ORDER — CARVEDILOL 25 MG PO TABS: 12.5000 mg | ORAL_TABLET | Freq: Every day | ORAL | Status: DC

## 2012-03-28 NOTE — Telephone Encounter (Signed)
Carvedilol refill needed [facility] [place]

## 2012-03-28 NOTE — Telephone Encounter (Signed)
Coreg reordered --pt notified--nt

## 2012-04-13 ENCOUNTER — Encounter: Payer: Self-pay | Admitting: Advanced Practice Midwife

## 2012-04-13 ENCOUNTER — Ambulatory Visit (INDEPENDENT_AMBULATORY_CARE_PROVIDER_SITE_OTHER): Payer: Self-pay | Admitting: Advanced Practice Midwife

## 2012-04-13 VITALS — BP 119/78 | HR 74 | Temp 98.2°F | Ht 63.0 in | Wt 162.0 lb

## 2012-04-13 DIAGNOSIS — N898 Other specified noninflammatory disorders of vagina: Secondary | ICD-10-CM

## 2012-04-13 MED ORDER — FLUCONAZOLE 150 MG PO TABS: 150.0000 mg | ORAL_TABLET | Freq: Once | ORAL | Status: AC

## 2012-04-13 NOTE — Progress Notes (Signed)
I saw and examined patient along with student and agree with above note.   

## 2012-04-13 NOTE — Progress Notes (Signed)
Subjective:     Angela Payne is a 51 y.o. female who presents for evaluation of an abnormal vaginal discharge. Symptoms have been present for 4-5 weeks weeks. Vaginal symptoms: discharge described as clear and water-like consistency  with itching. Contraception: IUD. She denies abnormal bleeding, burning, odor, pain and urinary symptoms of dysuria, urinary frequency, urinary hesitancy and urinary urgency Sexually transmitted infection risk: STD exposure questionable.  .   Review of Systems Genitourinary:positive for vaginal discharge and itching    Objective:    BP 119/78  Pulse 74  Temp(Src) 98.2 F (36.8 C) (Oral)  Ht 5\' 3"  (1.6 m)  Wt 73.483 kg (162 lb)  BMI 28.70 kg/m2 General appearance: alert and cooperative  Pelvic Exam: skin intact with no visible lesions on the external genitalia; vaginal mucosa and cervix pink and moist with no visible lesion. IUD strings visible outside the external os. Thick white discharge present within the vagina.  Assessment:    Vaginal candidiasis.    Plan:    GC/ Chlamydia   Wet prep Diflucan  Will review results and determine appropriate f/u

## 2012-04-13 NOTE — Patient Instructions (Signed)
Monilial Vaginitis Vaginitis in a soreness, swelling and redness (inflammation) of the vagina and vulva. Monilial vaginitis is not a sexually transmitted infection. CAUSES  Yeast vaginitis is caused by yeast (candida) that is normally found in your vagina. With a yeast infection, the candida has overgrown in number to a point that upsets the chemical balance. SYMPTOMS   White, thick vaginal discharge.   Swelling, itching, redness and irritation of the vagina and possibly the lips of the vagina (vulva).   Burning or painful urination.   Painful intercourse.  DIAGNOSIS  Things that may contribute to monilial vaginitis are:  Postmenopausal and virginal states.   Pregnancy.   Infections.   Being tired, sick or stressed, especially if you had monilial vaginitis in the past.   Diabetes. Good control will help lower the chance.   Birth control pills.   Tight fitting garments.   Using bubble bath, feminine sprays, douches or deodorant tampons.   Taking certain medications that kill germs (antibiotics).   Sporadic recurrence can occur if you become ill.  TREATMENT  Your caregiver will give you medication.  There are several kinds of anti monilial vaginal creams and suppositories specific for monilial vaginitis. For recurrent yeast infections, use a suppository or cream in the vagina 2 times a week, or as directed.   Anti-monilial or steroid cream for the itching or irritation of the vulva may also be used. Get your caregiver's permission.   Painting the vagina with methylene blue solution may help if the monilial cream does not work.   Eating yogurt may help prevent monilial vaginitis.  HOME CARE INSTRUCTIONS   Finish all medication as prescribed.   Do not have sex until treatment is completed or after your caregiver tells you it is okay.   Take warm sitz baths.   Do not douche.   Do not use tampons, especially scented ones.   Wear cotton underwear.   Avoid tight  pants and panty hose.   Tell your sexual partner that you have a yeast infection. They should go to their caregiver if they have symptoms such as mild rash or itching.   Your sexual partner should be treated as well if your infection is difficult to eliminate.   Practice safer sex. Use condoms.   Some vaginal medications cause latex condoms to fail. Vaginal medications that harm condoms are:   Cleocin cream.   Butoconazole (Femstat).   Terconazole (Terazol) vaginal suppository.   Miconazole (Monistat) (may be purchased over the counter).  SEEK MEDICAL CARE IF:   You have a temperature by mouth above 102 F (38.9 C).   The infection is getting worse after 2 days of treatment.   The infection is not getting better after 3 days of treatment.   You develop blisters in or around your vagina.   You develop vaginal bleeding, and it is not your menstrual period.   You have pain when you urinate.   You develop intestinal problems.   You have pain with sexual intercourse.  Document Released: 08/26/2005 Document Revised: 11/05/2011 Document Reviewed: 05/10/2009 Exit[employer] Patient Information 2012 [employer]. 

## 2012-04-13 NOTE — Progress Notes (Deleted)
Patient ID: Angela Payne, female   DOB: November 06, 1961, 51 y.o.   MRN: [id number]

## 2012-04-14 LAB — GC/CHLAMYDIA PROBE AMP, GENITAL: GC Probe Amp, Genital: NEGATIVE

## 2012-04-14 LAB — WET PREP, GENITAL

## 2012-04-20 ENCOUNTER — Ambulatory Visit (INDEPENDENT_AMBULATORY_CARE_PROVIDER_SITE_OTHER): Payer: Medicaid Other | Admitting: *Deleted

## 2012-04-20 ENCOUNTER — Encounter: Payer: Self-pay | Admitting: Internal Medicine

## 2012-04-20 DIAGNOSIS — I5022 Chronic systolic (congestive) heart failure: Secondary | ICD-10-CM

## 2012-04-20 DIAGNOSIS — I4901 Ventricular fibrillation: Secondary | ICD-10-CM

## 2012-04-20 DIAGNOSIS — I428 Other cardiomyopathies: Secondary | ICD-10-CM

## 2012-04-20 LAB — ICD DEVICE OBSERVATION
AL AMPLITUDE: 5 mv
AL IMPEDENCE ICD: 400 Ohm
AL THRESHOLD: 0.5 V
LV LEAD THRESHOLD: 1.75 V
MODE SWITCH EPISODES: 1
PACEART VT: 0
RV LEAD AMPLITUDE: 12 mv
RV LEAD THRESHOLD: 0.625 V
TOT-0007: 3
TZAT-0001SLOWVT: 1
TZAT-0020SLOWVT: 1 ms
TZON-0010SLOWVT: 40 ms
TZST-0001SLOWVT: 2
TZST-0001SLOWVT: 4
TZST-0001SLOWVT: 5
TZST-0003SLOWVT: 650 V
TZST-0003SLOWVT: 890 V
VENTRICULAR PACING ICD: 99.61 pct
VF: 0

## 2012-04-20 NOTE — Progress Notes (Signed)
ICD check with [employer] 

## 2012-04-28 ENCOUNTER — Telehealth: Payer: Self-pay | Admitting: Cardiovascular Disease

## 2012-04-28 NOTE — Telephone Encounter (Signed)
Pt calling says you'll know

## 2012-04-29 NOTE — Telephone Encounter (Signed)
Spoke with pt who states at one point she was prescribed albuterol by another MD and she is not currently taking this and wanted to make sure we did not have this on her med list. I told her we did not have it on her list of current medications.  She states she does have shortness of breath at times but that she is feeling good.  She is aware to call us if she has increased shortness of breath or other cardiac problems.

## 2012-05-03 ENCOUNTER — Encounter (HOSPITAL_COMMUNITY): Payer: Self-pay | Admitting: Emergency Medicine

## 2012-05-03 ENCOUNTER — Emergency Department (HOSPITAL_COMMUNITY)
Admission: EM | Admit: 2012-05-03 | Discharge: 2012-05-03 | Disposition: A | Discharge status: Home / Self Care | Payer: Self-pay | Attending: Emergency Medicine | Admitting: Emergency Medicine

## 2012-05-03 DIAGNOSIS — D649 Anemia, unspecified: Secondary | ICD-10-CM | POA: Insufficient documentation

## 2012-05-03 DIAGNOSIS — Z87891 Personal history of nicotine dependence: Secondary | ICD-10-CM | POA: Insufficient documentation

## 2012-05-03 DIAGNOSIS — I4891 Unspecified atrial fibrillation: Secondary | ICD-10-CM | POA: Insufficient documentation

## 2012-05-03 DIAGNOSIS — Z79899 Other long term (current) drug therapy: Secondary | ICD-10-CM | POA: Insufficient documentation

## 2012-05-03 DIAGNOSIS — J029 Acute pharyngitis, unspecified: Secondary | ICD-10-CM | POA: Insufficient documentation

## 2012-05-03 DIAGNOSIS — H9209 Otalgia, unspecified ear: Secondary | ICD-10-CM | POA: Insufficient documentation

## 2012-05-03 DIAGNOSIS — Z7982 Long term (current) use of aspirin: Secondary | ICD-10-CM | POA: Insufficient documentation

## 2012-05-03 MED ORDER — HYDROCODONE-ACETAMINOPHEN 7.5-325 MG/15ML PO SOLN: 15.0000 mL | Freq: Three times a day (TID) | ORAL | Status: AC | PRN

## 2012-05-03 NOTE — ED Provider Notes (Signed)
History     CSN: [id number]  Arrival date & time 05/03/12  0350   First MD Initiated Contact with Patient 05/03/12 [phone]      Chief Complaint  Patient presents with  . Sore Throat    (Consider location/radiation/quality/duration/timing/severity/associated sxs/prior treatment) HPI Comments: 51 year old female with history of nonischemic cardiomyopathy status post ICD placement after out of hospital cardiac arrest. She presents with a complaint of sore throat and ear pain has been present for approximately 4 days. Nothing makes this better or worse, symptoms are constant, denies abdominal pain fevers or chills but does admit to having sore throat, nasal congestion and nonproductive cough. She has no change in her hearing, no tinnitus, no rashes. She has had no medication prior to arrival  The history is provided by the patient and medical records.    Past Medical History  Diagnosis Date  . Other, mixed, or unspecified nondependent drug abuse, unspecified   . Other left bundle branch block   . Automatic implantable cardiac defibrillator in situ   . Chronic systolic heart failure   . Anemia, unspecified   . Paroxysmal supraventricular tachycardia   . Other primary cardiomyopathies     EF of 29% by MRI 06/2010, nonischemic  . Atrial fibrillation   . Cardiac arrest     Past Surgical History  Procedure Date  . Cardiac defibrillator placement     [facility] CD Q323020 Q defib, serial number [id number]    History reviewed. No pertinent family history.  History  Substance Use Topics  . Smoking status: Former Games developer  . Smokeless tobacco: Not on file   Comment: quit 2 weeks ago  . Alcohol Use: No    OB History    Grav Para Term Preterm Abortions TAB SAB Ect Mult Living   4 4 4       4       Review of Systems  All other systems reviewed and are negative.    Allergies  Review of patient's allergies indicates no known allergies.  Home Medications   Current Outpatient Rx    Name Route Sig Dispense Refill  . ASPIRIN 81 MG PO TABS Oral Take 81 mg by mouth daily.      Marland Kitchen CARVEDILOL 25 MG PO TABS Oral Take 0.5 tablets (12.5 mg total) by mouth daily. 15 tablet 3  . FUROSEMIDE 40 MG PO TABS Oral Take 1 tablet (40 mg total) by mouth 2 (two) times daily. Take one tablet by mouth twice daily. 60 tablet 1  . HYDROCODONE-ACETAMINOPHEN 7.5-325 MG/15ML PO SOLN Oral Take 15 mLs by mouth every 8 (eight) hours as needed for pain. 120 mL 0  . LISINOPRIL 5 MG PO TABS Oral Take 1 tablet (5 mg total) by mouth daily. 30 tablet 6  . POTASSIUM CHLORIDE CRYS ER 20 MEQ PO TBCR Oral Take 1 tablet (20 mEq total) by mouth 2 (two) times daily. 60 tablet 3  . PROMETHAZINE HCL 25 MG PO TABS Oral Take 25 mg by mouth every 8 (eight) hours as needed. FOR NAUSEa    . TRAMADOL HCL 50 MG PO TABS Oral Take 50 mg by mouth every 6 (six) hours as needed. FOR PAIN      BP 110/84  Pulse 70  Temp(Src) 97.7 F (36.5 C) (Oral)  Resp 16  SpO2 100%  Physical Exam  Nursing note and vitals reviewed. Constitutional: She appears well-developed and well-nourished. No distress.  HENT:  Head: Normocephalic and atraumatic.  Mouth/Throat: Oropharynx is clear  and moist. No oropharyngeal exudate.       Tympanic membranes clear bilaterally, oropharynx with mild erythema but no exudate hypertrophy or asymmetry. Mucous membranes are moist  Eyes: Conjunctivae and EOM are normal. Pupils are equal, round, and reactive to light. Right eye exhibits no discharge. Left eye exhibits no discharge. No scleral icterus.  Neck: Normal range of motion. Neck supple. No JVD present. No thyromegaly present.  Cardiovascular: Normal rate, regular rhythm, normal heart sounds and intact distal pulses.  Exam reveals no gallop and no friction rub.   No murmur heard. Pulmonary/Chest: Effort normal and breath sounds normal. No respiratory distress. She has no wheezes. She has no rales.  Abdominal: Soft. Bowel sounds are normal. She exhibits  no distension and no mass. There is no tenderness.  Musculoskeletal: Normal range of motion. She exhibits no edema and no tenderness.  Lymphadenopathy:    She has no cervical adenopathy.  Neurological: She is alert. Coordination normal.  Skin: Skin is warm and dry. No rash noted. No erythema.  Psychiatric: She has a normal mood and affect. Her behavior is normal.    ED Course  Procedures (including critical care time)   Labs Reviewed  RAPID STREP SCREEN   No results found.   1. Pharyngitis   2. Otalgia       MDM  Lungs are clear, oxygen level 100% on room air, pulse of 70, afebrile. I personally performed a rapid strep screen, results pending. Tympanic membranes are clear, there is no effusions of the middle ear, no signs of otitis media. Review of the medical records shows that the patient is a nonischemic retinopathy, catheterization showing no obstructive disease and a recent ejection fraction of 50% per Dr. Harvie Bridge consult report from January 2013   Pt informed of results - well appearing, amenable to d/c.  Discharge Prescriptions include:  Hydrocodone suspension     Vida Roller, MD 05/03/12 [phone]

## 2012-05-03 NOTE — ED Notes (Signed)
Patient complaining of sore throat for the past four days; patient reports hoarse voice and sinus drainage in back of throat.  Denies any respiratory complaints and chest pain.

## 2012-05-03 NOTE — Discharge Instructions (Signed)
Your tests are normal, take the medicines as prescribed - you may have some improvement with a chloroseptic spray - see your doctor for ongoing symptoms - return to the hospital for severe or worsening symptoms

## 2012-06-20 ENCOUNTER — Other Ambulatory Visit: Payer: Self-pay | Admitting: Cardiovascular Disease

## 2012-06-20 MED ORDER — FUROSEMIDE 40 MG PO TABS: 40.0000 mg | ORAL_TABLET | Freq: Two times a day (BID) | ORAL | Status: DC

## 2012-06-20 MED ORDER — CARVEDILOL 25 MG PO TABS: 12.5000 mg | ORAL_TABLET | Freq: Every day | ORAL | Status: DC

## 2012-06-20 NOTE — Telephone Encounter (Signed)
Refilled carvedilol and furosemide 

## 2012-07-17 ENCOUNTER — Emergency Department (HOSPITAL_COMMUNITY)
Admission: EM | Admit: 2012-07-17 | Discharge: 2012-07-17 | Disposition: A | Discharge status: Home / Self Care | Payer: Medicaid Other | Attending: Emergency Medicine | Admitting: Emergency Medicine

## 2012-07-17 ENCOUNTER — Encounter (HOSPITAL_COMMUNITY): Payer: Self-pay | Admitting: Physical Medicine and Rehabilitation

## 2012-07-17 DIAGNOSIS — L02219 Cutaneous abscess of trunk, unspecified: Secondary | ICD-10-CM | POA: Insufficient documentation

## 2012-07-17 DIAGNOSIS — I4891 Unspecified atrial fibrillation: Secondary | ICD-10-CM | POA: Insufficient documentation

## 2012-07-17 DIAGNOSIS — Z95810 Presence of automatic (implantable) cardiac defibrillator: Secondary | ICD-10-CM | POA: Insufficient documentation

## 2012-07-17 DIAGNOSIS — I447 Left bundle-branch block, unspecified: Secondary | ICD-10-CM | POA: Insufficient documentation

## 2012-07-17 DIAGNOSIS — D649 Anemia, unspecified: Secondary | ICD-10-CM | POA: Insufficient documentation

## 2012-07-17 DIAGNOSIS — L03319 Cellulitis of trunk, unspecified: Secondary | ICD-10-CM | POA: Insufficient documentation

## 2012-07-17 DIAGNOSIS — F1910 Other psychoactive substance abuse, uncomplicated: Secondary | ICD-10-CM | POA: Insufficient documentation

## 2012-07-17 DIAGNOSIS — Z8674 Personal history of sudden cardiac arrest: Secondary | ICD-10-CM | POA: Insufficient documentation

## 2012-07-17 DIAGNOSIS — L0291 Cutaneous abscess, unspecified: Secondary | ICD-10-CM

## 2012-07-17 DIAGNOSIS — I5022 Chronic systolic (congestive) heart failure: Secondary | ICD-10-CM | POA: Insufficient documentation

## 2012-07-17 DIAGNOSIS — Z87891 Personal history of nicotine dependence: Secondary | ICD-10-CM | POA: Insufficient documentation

## 2012-07-17 DIAGNOSIS — Z7982 Long term (current) use of aspirin: Secondary | ICD-10-CM | POA: Insufficient documentation

## 2012-07-17 MED ORDER — DOXYCYCLINE HYCLATE 100 MG PO CAPS: 100.0000 mg | ORAL_CAPSULE | Freq: Two times a day (BID) | ORAL | Status: AC

## 2012-07-17 MED ORDER — NAPROXEN 500 MG PO TABS: 500.0000 mg | ORAL_TABLET | Freq: Two times a day (BID) | ORAL | Status: DC

## 2012-07-17 NOTE — ED Provider Notes (Signed)
History  This chart was scribed for Angela Lennert, MD by Erskine Emery. This patient was seen in room TR10C/TR10C and the patient's care was started at 16:15.   CSN: [id number]  Arrival date & time 07/17/12  1516   First MD Initiated Contact with Patient 07/17/12 1615      Chief Complaint  Patient presents with  . Abscess    (Consider location/radiation/quality/duration/timing/severity/associated sxs/prior Treatment) Angela Payne is a 51 y.o. female who presents to the Emergency Department complaining of an area of tenderness on the right inner thigh of about a 6/10 pain severity since this morning. Pt denies any drainage from the area. Patient is a 51 y.o. female presenting with abscess. The history is provided by the patient. No language interpreter was used.  Abscess  This is a new problem. The current episode started today. The onset was gradual. The problem occurs continuously. The problem has been gradually worsening. Affected Location: right groin area. The problem is mild. The abscess is characterized by redness and painfulness. It is unknown what she was exposed to. The abscess first occurred at home. Pertinent negatives include no fever, no vomiting and no decreased responsiveness. She has received no recent medical care.  [facility]   Past Medical History  Diagnosis Date  . Other, mixed, or unspecified nondependent drug abuse, unspecified   . Other left bundle branch block   . Automatic implantable cardiac defibrillator in situ   . Chronic systolic heart failure   . Anemia, unspecified   . Paroxysmal supraventricular tachycardia   . Other primary cardiomyopathies     EF of 29% by MRI 06/2010, nonischemic  . Atrial fibrillation   . Cardiac arrest     Past Surgical History  Procedure Date  . Cardiac defibrillator placement     [facility] CD [id number] Q defib, serial number [id number]    No family history on file.  History    Substance Use Topics  . Smoking status: Former Games developer  . Smokeless tobacco: Not on file   Comment: quit 2 weeks ago  . Alcohol Use: No    OB History    Grav Para Term Preterm Abortions TAB SAB Ect Mult Living   4 4 4       4       Review of Systems  Constitutional: Negative for fever, chills and decreased responsiveness.  HENT: Negative for neck pain.   Respiratory: Negative for shortness of breath.   Gastrointestinal: Negative for nausea and vomiting.  Genitourinary: Negative for dysuria.  Skin: Positive for wound.  Neurological: Negative for weakness.    Allergies  Review of patient's allergies indicates no known allergies.  Home Medications   Current Outpatient Rx  Name Route Sig Dispense Refill  . ALBUTEROL SULFATE HFA 108 (90 BASE) MCG/ACT IN AERS Inhalation Inhale 2 puffs into the lungs every 4 (four) hours as needed. For shortness of breath or wheezing    . ASPIRIN 81 MG PO TABS Oral Take 81 mg by mouth daily.     Marland Kitchen CARVEDILOL 25 MG PO TABS Oral Take 0.5 tablets (12.5 mg total) by mouth daily. 15 tablet 3  . FUROSEMIDE 40 MG PO TABS Oral Take 40 mg by mouth.    Marland Kitchen LISINOPRIL 5 MG PO TABS Oral Take 1 tablet (5 mg total) by mouth daily. 30 tablet 6  . ADULT MULTIVITAMIN W/MINERALS CH Oral Take 1 tablet by mouth daily.    Marland Kitchen  POTASSIUM CHLORIDE CRYS ER 20 MEQ PO TBCR Oral Take 1 tablet (20 mEq total) by mouth 2 (two) times daily. 60 tablet 3  . PROMETHAZINE HCL 25 MG PO TABS Oral Take 25 mg by mouth every 8 (eight) hours as needed. FOR NAUSEa    . TRAMADOL HCL 50 MG PO TABS Oral Take 50 mg by mouth every 6 (six) hours as needed. FOR PAIN      BP 115/76  Pulse 81  Temp 98.2 F (36.8 C) (Oral)  Resp 18  SpO2 98%  Physical Exam  Constitutional: She is oriented to person, place, and time. She appears well-developed and well-nourished. No distress.  HENT:  Head: Normocephalic and atraumatic.  Eyes: Conjunctivae are normal.  Neck: No tracheal deviation present.   Pulmonary/Chest: Effort normal.  Musculoskeletal: Normal range of motion.       Right groin: 1 cm indurated tender mass.  Neurological: She is oriented to person, place, and time.  Skin: Skin is warm.  Psychiatric: She has a normal mood and affect.    ED Course  Procedures (including critical care time) DIAGNOSTIC STUDIES: Oxygen Saturation is 98% on room air, normal by my interpretation.    COORDINATION OF CARE: 16:15--I evaluated the patient and we discussed a treatment plan including medications to which the pt agreed. I instructed the pt to follow up if the symptoms do not improve.    Labs Reviewed - No data to display No results found.   No diagnosis found.    MDM        The chart was scribed for me under my direct supervision.  I personally performed the history, physical, and medical decision making and all procedures in the evaluation of this patient.Angela Lennert, MD 07/17/12 [phone]

## 2012-07-17 NOTE — ED Notes (Signed)
Pt presents to department for evaluation of R inner thigh abscess. Onset today. Pt states 5/10 pain at the time. No drainage noted at the time. Pt is alert and oriented x4. No signs of acute distress noted.

## 2012-07-19 ENCOUNTER — Telehealth (INDEPENDENT_AMBULATORY_CARE_PROVIDER_SITE_OTHER): Payer: Self-pay

## 2012-07-19 NOTE — Telephone Encounter (Signed)
Pt called to follow up after being seen in the ED for a groin abscess.  She has been taking her antibiotic for one day.  I instructed her to finish the antibiotic and call our office if the area became worse or began draining.  She voiced understanding.

## 2012-07-20 ENCOUNTER — Encounter (HOSPITAL_COMMUNITY): Payer: Self-pay | Admitting: Emergency Medicine

## 2012-07-20 ENCOUNTER — Emergency Department (HOSPITAL_COMMUNITY)
Admission: EM | Admit: 2012-07-20 | Discharge: 2012-07-20 | Disposition: A | Discharge status: Home / Self Care | Payer: Medicaid Other | Attending: Emergency Medicine | Admitting: Emergency Medicine

## 2012-07-20 ENCOUNTER — Telehealth: Payer: Self-pay | Admitting: Cardiovascular Disease

## 2012-07-20 DIAGNOSIS — I4891 Unspecified atrial fibrillation: Secondary | ICD-10-CM | POA: Insufficient documentation

## 2012-07-20 DIAGNOSIS — Z87891 Personal history of nicotine dependence: Secondary | ICD-10-CM | POA: Insufficient documentation

## 2012-07-20 DIAGNOSIS — I5022 Chronic systolic (congestive) heart failure: Secondary | ICD-10-CM | POA: Insufficient documentation

## 2012-07-20 DIAGNOSIS — I959 Hypotension, unspecified: Secondary | ICD-10-CM | POA: Insufficient documentation

## 2012-07-20 DIAGNOSIS — N289 Disorder of kidney and ureter, unspecified: Secondary | ICD-10-CM | POA: Insufficient documentation

## 2012-07-20 DIAGNOSIS — I447 Left bundle-branch block, unspecified: Secondary | ICD-10-CM | POA: Insufficient documentation

## 2012-07-20 DIAGNOSIS — Z95810 Presence of automatic (implantable) cardiac defibrillator: Secondary | ICD-10-CM | POA: Insufficient documentation

## 2012-07-20 DIAGNOSIS — Z7982 Long term (current) use of aspirin: Secondary | ICD-10-CM | POA: Insufficient documentation

## 2012-07-20 LAB — CBC WITH DIFFERENTIAL/PLATELET
Basophils Absolute: 0 10*3/uL (ref 0.0–0.1)
Basophils Relative: 0 % (ref 0–1)
HCT: 35.6 % — ABNORMAL LOW (ref 36.0–46.0)
MCHC: 32.6 g/dL (ref 30.0–36.0)
Monocytes Absolute: 0.5 10*3/uL (ref 0.1–1.0)
Neutro Abs: 4.7 10*3/uL (ref 1.7–7.7)
Platelets: 270 10*3/uL (ref 150–400)
RDW: 15.4 % (ref 11.5–15.5)

## 2012-07-20 LAB — BASIC METABOLIC PANEL
Calcium: 9.3 mg/dL (ref 8.4–10.5)
Chloride: 103 mEq/L (ref 96–112)
Creatinine, Ser: 2.16 mg/dL — ABNORMAL HIGH (ref 0.50–1.10)
GFR calc Af Amer: 30 mL/min — ABNORMAL LOW (ref >90)

## 2012-07-20 NOTE — ED Notes (Signed)
Pt c/o frontal HA behind eyes with blurry vision starting today; pt sts SOB per norm; pt sts took here heart and BP meds at a different time today and has felt funny since then

## 2012-07-20 NOTE — Telephone Encounter (Signed)
Patient called stated she is having blurred vision,chest pain,chest tightness,sob.States chest pain is a # 8.Stated she has been having pain for the last 3 to 4 hours.Patient advised to go to [facility] ER.Rosann Auerbach was called and notified.

## 2012-07-20 NOTE — ED Notes (Signed)
Pt. States she had a HA, tightness in neck, and blurred vision. States "I've been stressed and fatigued lately". Pt. Currently pain free and vision "normal". Has hx of same. States "I just need a work note for tomorrow so I can rest and then I'll be fine". Hx of heart attack in 2011.

## 2012-07-20 NOTE — Telephone Encounter (Signed)
PT HAVING BLURRED VISION, CHEST FEELS FUNNY , FEELS HOT AND SWEATY FOR ABOUT 3 HOURS., PLS CALL [phone]

## 2012-07-21 ENCOUNTER — Ambulatory Visit (INDEPENDENT_AMBULATORY_CARE_PROVIDER_SITE_OTHER): Payer: Medicaid Other | Admitting: *Deleted

## 2012-07-21 ENCOUNTER — Encounter: Payer: Self-pay | Admitting: Internal Medicine

## 2012-07-21 DIAGNOSIS — I428 Other cardiomyopathies: Secondary | ICD-10-CM

## 2012-07-21 DIAGNOSIS — I4901 Ventricular fibrillation: Secondary | ICD-10-CM

## 2012-07-21 DIAGNOSIS — I5022 Chronic systolic (congestive) heart failure: Secondary | ICD-10-CM

## 2012-07-21 LAB — ICD DEVICE OBSERVATION
AL AMPLITUDE: 3.4 mv
AL IMPEDENCE ICD: 375 Ohm
HV IMPEDENCE: 44 Ohm
LV LEAD IMPEDENCE ICD: 837.5 Ohm
TOT-0007: 3
TOT-0008: 0
TOT-0010: 9
TZAT-0013SLOWVT: 3
TZAT-0018SLOWVT: NEGATIVE
TZAT-0020SLOWVT: 1 ms
TZON-0003SLOWVT: 300 ms
TZON-0005SLOWVT: 6
TZST-0001SLOWVT: 2
TZST-0001SLOWVT: 5
TZST-0003SLOWVT: 845 V
VF: 0

## 2012-07-21 NOTE — ED Provider Notes (Signed)
History     CSN: [id number]  Arrival date & time 07/20/12  1312   First MD Initiated Contact with Patient 07/20/12 2039      Chief Complaint  Patient presents with  . Headache  . Blurred Vision    (Consider location/radiation/quality/duration/timing/severity/associated sxs/prior treatment) Patient is a 51 y.o. female presenting with headaches. The history is provided by the patient and medical records.  Headache  Pertinent negatives include no fever, no palpitations, no shortness of breath, no nausea and no vomiting.   51 year old, female, presents emergency department complaining of headache, and blurred vision.  She states that she is under a lot of stress at work.  She denied fevers, neck pain, nausea, vomiting, rash.  She states that since.  She has gotten to the emergency department.  Her symptoms have resolved.  She denies pain.  He well, since.  Denies recent illness.  She has a history of heart disease, for which she takes Lasix.  Past Medical History  Diagnosis Date  . Other, mixed, or unspecified nondependent drug abuse, unspecified   . Other left bundle branch block   . Automatic implantable cardiac defibrillator in situ   . Chronic systolic heart failure   . Anemia, unspecified   . Paroxysmal supraventricular tachycardia   . Other primary cardiomyopathies     EF of 29% by MRI 06/2010, nonischemic  . Atrial fibrillation   . Cardiac arrest     Past Surgical History  Procedure Date  . Cardiac defibrillator placement     [facility] CD [id number] Q defib, serial number [id number]    History reviewed. No pertinent family history.  History  Substance Use Topics  . Smoking status: Former Games developer  . Smokeless tobacco: Not on file   Comment: quit 2 weeks ago  . Alcohol Use: No    OB History    Grav Para Term Preterm Abortions TAB SAB Ect Mult Living   4 4 4       4       Review of Systems  Constitutional: Negative for fever, chills and diaphoresis.  HENT: Negative  for neck pain.   Eyes: Positive for visual disturbance. Negative for photophobia.  Respiratory: Negative for cough and shortness of breath.   Cardiovascular: Negative for chest pain and palpitations.  Gastrointestinal: Negative for nausea, vomiting and abdominal pain.  Genitourinary: Negative for dysuria.  Musculoskeletal: Negative for back pain.  Skin: Negative for rash.  Neurological: Positive for headaches. Negative for dizziness, weakness and light-headedness.  Hematological: Does not bruise/bleed easily.  Psychiatric/Behavioral: Negative for confusion.  All other systems reviewed and are negative.    Allergies  Review of patient's allergies indicates no known allergies.  Home Medications   Current Outpatient Rx  Name Route Sig Dispense Refill  . ALBUTEROL SULFATE HFA 108 (90 BASE) MCG/ACT IN AERS Inhalation Inhale 2 puffs into the lungs every 4 (four) hours as needed. For shortness of breath or wheezing    . ASPIRIN 81 MG PO CHEW Oral Chew 81 mg by mouth daily.    Marland Kitchen CARVEDILOL 25 MG PO TABS Oral Take 0.5 tablets (12.5 mg total) by mouth daily. 15 tablet 3  . DOXYCYCLINE HYCLATE 100 MG PO CAPS Oral Take 1 capsule (100 mg total) by mouth 2 (two) times daily. 20 capsule 0  . FUROSEMIDE 40 MG PO TABS Oral Take 40 mg by mouth.    Marland Kitchen LISINOPRIL 5 MG PO TABS Oral Take 1 tablet (5 mg total) by mouth daily. 30  tablet 6  . ADULT MULTIVITAMIN W/MINERALS CH Oral Take 1 tablet by mouth daily.    Marland Kitchen NAPROXEN 500 MG PO TABS Oral Take 1 tablet (500 mg total) by mouth 2 (two) times daily. 20 tablet 0  . POTASSIUM CHLORIDE CRYS ER 20 MEQ PO TBCR Oral Take 1 tablet (20 mEq total) by mouth 2 (two) times daily. 60 tablet 3  . PROMETHAZINE HCL 25 MG PO TABS Oral Take 25 mg by mouth every 8 (eight) hours as needed. For nausea    . TRAMADOL HCL 50 MG PO TABS Oral Take 50 mg by mouth every 6 (six) hours as needed. FOR PAIN      BP 119/72  Pulse 68  Temp 97.3 F (36.3 C) (Oral)  Resp 18  SpO2  98%  Physical Exam  Constitutional: She is oriented to person, place, and time. She appears well-developed and well-nourished.  HENT:  Head: Normocephalic and atraumatic.  Eyes: Pupils are equal, round, and reactive to light.  Neck: Normal range of motion. Neck supple.  Cardiovascular: Normal rate, regular rhythm and normal heart sounds.   No murmur heard. Pulmonary/Chest: Effort normal and breath sounds normal. No respiratory distress. She has no wheezes. She has no rales.  Abdominal: Soft. She exhibits no distension and no mass. There is no tenderness. There is no rebound and no guarding.  Musculoskeletal: Normal range of motion. She exhibits no edema and no tenderness.  Neurological: She is alert and oriented to person, place, and time. No cranial nerve deficit.  Skin: Skin is warm and dry. No rash noted. No erythema.  Psychiatric: She has a normal mood and affect. Her behavior is normal.    ED Course  Procedures (including critical care time) headache, and vision changes, resolved  Labs Reviewed  CBC WITH DIFFERENTIAL - Abnormal; Notable for the following:    Hemoglobin 11.6 (*)     HCT 35.6 (*)     All other components within normal limits  BASIC METABOLIC PANEL - Abnormal; Notable for the following:    Glucose, Bld 110 (*)     BUN 26 (*)     Creatinine, Ser 2.16 (*)     GFR calc non Af Amer 26 (*)     GFR calc Af Amer 30 (*)     All other components within normal limits   No results found.   1. Renal insufficiency   2. Hypotension       MDM  Headache, resolved. Renal insufficiency.  This is probably iatrogenic related to her Lasix.  Use.  Since she is currently asymptomatic and her creatinine is only slightly higher than previously.  We will release her with instructions to stop her Lasix, and followup with her physician, for repeat check of her creatinine level.  The patient, again, is asymptomatic and able to drink fluids, and understands instructions to stop her  Lasix.  Therefore, do not believe that she needs to be admitted to the hospital for IV fluids        Cheri Guppy, MD 07/21/12 [phone]

## 2012-07-21 NOTE — Progress Notes (Signed)
ICD check with [employer] 

## 2012-07-22 ENCOUNTER — Ambulatory Visit: Payer: Medicaid Other | Admitting: Cardiovascular Disease

## 2012-07-27 ENCOUNTER — Encounter: Payer: Self-pay | Admitting: Nurse Practitioner

## 2012-07-27 ENCOUNTER — Ambulatory Visit (INDEPENDENT_AMBULATORY_CARE_PROVIDER_SITE_OTHER): Payer: Medicaid Other | Admitting: Nurse Practitioner

## 2012-07-27 VITALS — BP 124/72 | HR 81 | Ht 63.0 in | Wt 166.1 lb

## 2012-07-27 DIAGNOSIS — I5022 Chronic systolic (congestive) heart failure: Secondary | ICD-10-CM

## 2012-07-27 DIAGNOSIS — I428 Other cardiomyopathies: Secondary | ICD-10-CM | POA: Insufficient documentation

## 2012-07-27 DIAGNOSIS — I509 Heart failure, unspecified: Secondary | ICD-10-CM

## 2012-07-27 NOTE — Progress Notes (Signed)
Patient Name: Angela Payne Date of Encounter: 07/27/2012  Primary Care Provider:  Dellis Anes, MD Primary Cardiologist:  C. McAlhany, MD/S. Graciela Husbands, MD  Patient Profile  51 y/o female with h/o NICM who presents for f/u.  Problem List   Past Medical History  Diagnosis Date  . Other left bundle branch block   . Automatic implantable cardiac defibrillator in situ     a. 06/2010 s/p  s/p SJM 3231-40 Bi V ICD Ser # [id number].  Marland Kitchen Chronic systolic heart failure   . Anemia, unspecified   . Paroxysmal supraventricular tachycardia   . Nonischemic cardiomyopathy     a. 06/2010 OOH VF arrest;  b. 06/2010 Cath: nonobs dzs, EF 15-20%, glob HK;  c. 06/2010 Echo: EF 15%, diff HK, Gr 2 DD;  d. 06/2010 Cardiac MRI EF of 29%, no scar;  e. 06/2010 s/p SJM 3231-40 Bi V ICD Ser # [id number].  Marland Kitchen Atrial fibrillation     a. This occurred in setting of resuscitation efforts during VF arrest.  . Cardiac arrest     a. 06/2010  . History of tobacco abuse   . History of cocaine abuse     remote   Past Surgical History  Procedure Date  . Cardiac defibrillator placement     [facility] CD [id number] Q defib, serial number [id number]    Allergies  No Known Allergies  HPI  51 y/o female with the above problem list.  She was in her USOH until about 10 days ago when she began to experience general malaise with nausea and reduced PO intake.  On 8/21, she c/o headache and blurring of her vision with dizziness.  She presented to the ED where VS were stable but she was found to have elevation of her creatinine to 2.16 (up from 1.1 in January 2013).  She was treated with IVF and advised to hold her lasix and f/u with cardiology.  Since then, she has recovered and feels fine.  Her PO intake has returned to normal.  She occasionally has dyspnea, which she treats with an inhaler.  She denies DOE, pnd, orthopnea, n, v, dizziness, syncope, or edema.  Since coming off of her lasix, her weight has increased from her baseline of roughly  162 @ home to 165 today (166 on our scale).    Home Medications  Prior to Admission medications   Medication Sig Start Date End Date Taking? Authorizing Provider  albuterol (PROVENTIL HFA;VENTOLIN HFA) 108 (90 BASE) MCG/ACT inhaler Inhale 2 puffs into the lungs every 4 (four) hours as needed. For shortness of breath or wheezing   Yes Historical Provider, MD  aspirin 81 MG chewable tablet Chew 81 mg by mouth daily.   Yes Historical Provider, MD  carvedilol (COREG) 25 MG tablet Take 0.5 tablets (12.5 mg total) by mouth daily. 06/20/12  Yes Kathleene Hazel, MD  doxycycline (VIBRAMYCIN) 100 MG capsule Take 1 capsule (100 mg total) by mouth 2 (two) times daily. 07/17/12 07/27/12 Yes Benny Lennert, MD  lisinopril (PRINIVIL,ZESTRIL) 5 MG tablet Take 1 tablet (5 mg total) by mouth daily. 01/11/12  Yes Kathleene Hazel, MD  Multiple Vitamin (MULTIVITAMIN WITH MINERALS) TABS Take 1 tablet by mouth daily.   Yes Historical Provider, MD  potassium chloride SA (K-DUR,KLOR-CON) 20 MEQ tablet Take 1 tablet (20 mEq total) by mouth 2 (two) times daily. 01/11/12  Yes Kathleene Hazel, MD  promethazine (PHENERGAN) 25 MG tablet Take 25 mg by mouth every 8 (eight) hours  as needed. For nausea   Yes Historical Provider, MD  traMADol (ULTRAM) 50 MG tablet Take 50 mg by mouth every 6 (six) hours as needed. FOR PAIN   Yes Historical Provider, MD  furosemide (LASIX) 40 MG tablet Take 40 mg by mouth.    Historical Provider, MD    Review of Systems  Occasional dyspnea for which she uses inhaler.  Weight has been rising as noted above.  All other systems reviewed and are otherwise negative except as noted above.  Physical Exam  Blood pressure 124/72, pulse 81, height 5\' 3"  (1.6 m), weight 166 lb 1.9 oz (75.352 kg).  General: Pleasant, NAD Psych: Normal affect. Neuro: Alert and oriented X 3. Moves all extremities spontaneously. HEENT: Normal  Neck: Supple without bruits or JVD. Lungs:  Resp regular and  unlabored, CTA. Heart: RRR no s3, s4, or murmurs. Abdomen: Soft, non-tender, non-distended, BS + x 4.  Extremities: No clubbing, cyanosis or edema. DP/PT/Radials 2+ and equal bilaterally.  Accessory Clinical Findings  ECG - Bi-V paced, 81, no acute changes.  Assessment & Plan  1.  Chronic systolic CHF/NICM:  Volume looks good on exam although weight is up about 3 lbs on her home scale.  She has been off of lasix since last week in the setting of creatinine elevation.  She says that she had reduced PO intake leading up to that ER visit and this has improved.  We will repeat a bmet today and I've advised her to resume her lasix @ 40mg  daily.  Of note, despite not taking lasix over the past week, she has continued to take potassium.  2.  Dispo:  Labs today.  Resume lasix.  We will contact her with lab results in case any other changes need to be made.  F/U with Dr. [name] in 6-8 wks or sooner if necessary.  Nicolasa Ducking, NP 07/27/2012, 4:29 PM

## 2012-07-27 NOTE — Patient Instructions (Addendum)
Your physician recommends that you schedule a follow-up appointment in: 2 months with Dr [name] Your physician recommends that you have lab work drawn today (BMP) Please RESUME your Furosemide at 40 mg daily

## 2012-07-28 LAB — BASIC METABOLIC PANEL
CO2: 19 mEq/L (ref 19–32)
Calcium: 8.6 mg/dL (ref 8.4–10.5)
GFR: 81.14 mL/min (ref >60.00)
Sodium: 137 mEq/L (ref 135–145)

## 2012-08-04 ENCOUNTER — Telehealth: Payer: Self-pay | Admitting: Cardiovascular Disease

## 2012-08-04 NOTE — Telephone Encounter (Signed)
Please return call to patient [phone] regarding test results

## 2012-08-05 NOTE — Telephone Encounter (Signed)
Pt was given results by Roanna Raider MA yesterday afternoon.

## 2012-08-09 ENCOUNTER — Telehealth: Payer: Self-pay | Admitting: Cardiovascular Disease

## 2012-08-09 NOTE — Telephone Encounter (Signed)
Walk in pt Form " Pt Dropped Off Letter For Doc" Placed in [place] Box 08/09/12/KM

## 2012-08-11 ENCOUNTER — Encounter: Payer: Self-pay | Admitting: *Deleted

## 2012-08-11 ENCOUNTER — Telehealth: Payer: Self-pay | Admitting: Cardiovascular Disease

## 2012-08-11 NOTE — Telephone Encounter (Signed)
Spoke with pt and told her letters would be at front desk for her to pick up

## 2012-08-11 NOTE — Telephone Encounter (Signed)
Left message to call back. Letters are complete.

## 2012-08-11 NOTE — Telephone Encounter (Signed)
New Problem:    Patient called in wanting to know if her paperwork was completed yet.  Please call back.

## 2012-08-29 ENCOUNTER — Other Ambulatory Visit (HOSPITAL_COMMUNITY): Payer: Self-pay | Admitting: Family Medicine

## 2012-08-29 DIAGNOSIS — Z1231 Encounter for screening mammogram for malignant neoplasm of breast: Secondary | ICD-10-CM

## 2012-09-01 ENCOUNTER — Emergency Department (HOSPITAL_COMMUNITY): Payer: Medicaid Other

## 2012-09-01 ENCOUNTER — Encounter (HOSPITAL_COMMUNITY): Payer: Self-pay | Admitting: *Deleted

## 2012-09-01 ENCOUNTER — Emergency Department (HOSPITAL_COMMUNITY)
Admission: EM | Admit: 2012-09-01 | Discharge: 2012-09-01 | Disposition: A | Discharge status: Home / Self Care | Payer: Medicaid Other | Attending: Emergency Medicine | Admitting: Emergency Medicine

## 2012-09-01 DIAGNOSIS — R300 Dysuria: Secondary | ICD-10-CM | POA: Insufficient documentation

## 2012-09-01 DIAGNOSIS — I428 Other cardiomyopathies: Secondary | ICD-10-CM | POA: Insufficient documentation

## 2012-09-01 DIAGNOSIS — R339 Retention of urine, unspecified: Secondary | ICD-10-CM | POA: Insufficient documentation

## 2012-09-01 DIAGNOSIS — M25559 Pain in unspecified hip: Secondary | ICD-10-CM | POA: Insufficient documentation

## 2012-09-01 DIAGNOSIS — R109 Unspecified abdominal pain: Secondary | ICD-10-CM | POA: Insufficient documentation

## 2012-09-01 DIAGNOSIS — R39198 Other difficulties with micturition: Secondary | ICD-10-CM | POA: Insufficient documentation

## 2012-09-01 DIAGNOSIS — M25552 Pain in left hip: Secondary | ICD-10-CM

## 2012-09-01 DIAGNOSIS — I4891 Unspecified atrial fibrillation: Secondary | ICD-10-CM | POA: Insufficient documentation

## 2012-09-01 DIAGNOSIS — Z79899 Other long term (current) drug therapy: Secondary | ICD-10-CM | POA: Insufficient documentation

## 2012-09-01 DIAGNOSIS — Z95810 Presence of automatic (implantable) cardiac defibrillator: Secondary | ICD-10-CM | POA: Insufficient documentation

## 2012-09-01 DIAGNOSIS — I509 Heart failure, unspecified: Secondary | ICD-10-CM | POA: Insufficient documentation

## 2012-09-01 DIAGNOSIS — I5022 Chronic systolic (congestive) heart failure: Secondary | ICD-10-CM | POA: Insufficient documentation

## 2012-09-01 DIAGNOSIS — R3912 Poor urinary stream: Secondary | ICD-10-CM

## 2012-09-01 DIAGNOSIS — M5126 Other intervertebral disc displacement, lumbar region: Secondary | ICD-10-CM | POA: Insufficient documentation

## 2012-09-01 LAB — URINALYSIS, ROUTINE W REFLEX MICROSCOPIC
Hgb urine dipstick: NEGATIVE
Leukocytes, UA: NEGATIVE
Nitrite: NEGATIVE
Protein, ur: NEGATIVE mg/dL
Specific Gravity, Urine: 1.027 (ref 1.005–1.030)
Urobilinogen, UA: 2 mg/dL — ABNORMAL HIGH (ref 0.0–1.0)

## 2012-09-01 LAB — POCT PREGNANCY, URINE: Preg Test, Ur: NEGATIVE

## 2012-09-01 MED ORDER — HYDROCODONE-ACETAMINOPHEN 5-325 MG PO TABS: 2.0000 | ORAL_TABLET | ORAL | Status: DC | PRN

## 2012-09-01 NOTE — ED Provider Notes (Signed)
History     CSN: [id number]  Arrival date & time 09/01/12  1504   First MD Initiated Contact with Patient 09/01/12 1633      Chief Complaint  Patient presents with  . Urinary Retention  . Back Pain    (Consider location/radiation/quality/duration/timing/severity/associated sxs/prior treatment) Patient is a 51 y.o. female presenting with hip pain. The history is provided by the patient and the spouse.  Hip Pain This is a new problem. The current episode started in the past 7 days. The problem occurs constantly. The problem has been unchanged. Associated symptoms include a change in bowel habit and urinary symptoms. Pertinent negatives include no abdominal pain, anorexia, arthralgias, chest pain, chills, congestion, coughing, diaphoresis, fatigue, fever, headaches, joint swelling, nausea or rash. Nothing aggravates the symptoms. She has tried oral narcotics for the symptoms. The treatment provided no relief.    Past Medical History  Diagnosis Date  . Other left bundle branch block   . Automatic implantable cardiac defibrillator in situ     a. 06/2010 s/p  s/p SJM 3231-40 Bi V ICD Ser # [id number].  Marland Kitchen Chronic systolic heart failure   . Anemia, unspecified   . Paroxysmal supraventricular tachycardia   . Nonischemic cardiomyopathy     a. 06/2010 OOH VF arrest;  b. 06/2010 Cath: nonobs dzs, EF 15-20%, glob HK;  c. 06/2010 Echo: EF 15%, diff HK, Gr 2 DD;  d. 06/2010 Cardiac MRI EF of 29%, no scar;  e. 06/2010 s/p SJM 3231-40 Bi V ICD Ser # [id number].  Marland Kitchen Atrial fibrillation     a. This occurred in setting of resuscitation efforts during VF arrest.  . Cardiac arrest     a. 06/2010  . History of tobacco abuse   . History of cocaine abuse     remote    Past Surgical History  Procedure Date  . Cardiac defibrillator placement     [facility] CD [id number] Q defib, serial number [id number]    No family history on file.  History  Substance Use Topics  . Smoking status: Former Smoker    Quit date:  07/28/2011  . Smokeless tobacco: Never Used   Comment: quit 2 weeks ago  . Alcohol Use: No    OB History    Grav Para Term Preterm Abortions TAB SAB Ect Mult Living   4 4 4       4       Review of Systems  Constitutional: Negative for fever, chills, diaphoresis and fatigue.  HENT: Negative for congestion.   Respiratory: Negative for cough and shortness of breath.   Cardiovascular: Negative for chest pain.  Gastrointestinal: Positive for change in bowel habit. Negative for nausea, abdominal pain, diarrhea and anorexia.  Genitourinary: Positive for decreased urine volume and difficulty urinating.  Musculoskeletal: Negative for joint swelling and arthralgias.  Skin: Negative for rash.  Neurological: Negative for headaches.  All other systems reviewed and are negative.    Allergies  Review of patient's allergies indicates no known allergies.  Home Medications   Current Outpatient Rx  Name Route Sig Dispense Refill  . ALBUTEROL SULFATE HFA 108 (90 BASE) MCG/ACT IN AERS Inhalation Inhale 2 puffs into the lungs every 4 (four) hours as needed. For shortness of breath or wheezing    . ASPIRIN 81 MG PO CHEW Oral Chew 81 mg by mouth daily.    Marland Kitchen CARVEDILOL 25 MG PO TABS Oral Take 0.5 tablets (12.5 mg total) by mouth daily. 15 tablet 3  .  FUROSEMIDE 40 MG PO TABS Oral Take 40 mg by mouth.    Marland Kitchen LISINOPRIL 5 MG PO TABS Oral Take 1 tablet (5 mg total) by mouth daily. 30 tablet 6  . ADULT MULTIVITAMIN W/MINERALS CH Oral Take 1 tablet by mouth daily.    Marland Kitchen POTASSIUM CHLORIDE CRYS ER 20 MEQ PO TBCR Oral Take 1 tablet (20 mEq total) by mouth 2 (two) times daily. 60 tablet 3  . PROMETHAZINE HCL 25 MG PO TABS Oral Take 25 mg by mouth every 8 (eight) hours as needed. For nausea    . TRAMADOL HCL 50 MG PO TABS Oral Take 50 mg by mouth every 6 (six) hours as needed. FOR PAIN      BP 115/79  Pulse 80  Temp 97.8 F (36.6 C) (Oral)  Resp 16  SpO2 100%  Physical Exam  Nursing note and vitals  reviewed. Constitutional: She is oriented to person, place, and time. She appears well-developed and well-nourished. No distress.  HENT:  Head: Normocephalic and atraumatic.  Eyes: EOM are normal. Pupils are equal, round, and reactive to light.  Neck: Normal range of motion.  Cardiovascular: Normal rate and normal heart sounds.   Pulmonary/Chest: Effort normal and breath sounds normal. No respiratory distress.  Abdominal: Soft. She exhibits no distension. There is no tenderness.  Genitourinary:       Normal rectal tone  Musculoskeletal: Normal range of motion.  Neurological: She is alert and oriented to person, place, and time. She has normal reflexes. She displays normal reflexes. No cranial nerve deficit. She exhibits normal muscle tone. Coordination normal. She displays no Babinski's sign on the right side. She displays no Babinski's sign on the left side.  Reflex Scores:      Patellar reflexes are 2+ on the right side and 2+ on the left side.      Achilles reflexes are 2+ on the right side and 2+ on the left side.      No clonus  Skin: Skin is warm and dry.    ED Course  Procedures (including critical care time)  Labs Reviewed  URINALYSIS, ROUTINE W REFLEX MICROSCOPIC - Abnormal; Notable for the following:    Urobilinogen, UA 2.0 (*)     All other components within normal limits  POCT PREGNANCY, URINE   Ct Lumbar Spine Wo Contrast  09/01/2012  *RADIOLOGY REPORT*  Clinical Data: Bilateral flank pain and dysuria.  CT LUMBAR SPINE WITHOUT CONTRAST  Technique:  Multidetector CT imaging of the lumbar spine was performed without intravenous contrast administration. Multiplanar CT image reconstructions were also generated.  Comparison: None.  Findings: Normal lumbar alignment.  Negative for fracture.  No acute bony changes are identified.  Mild disc bulging at L2-3.  Mild disc bulging extending into the left foramen at L3-4.  Mild facet degeneration without significant spinal stenosis.   Mild disc bulging and mild facet degeneration at L4-5.  No focal disc protrusion.  Limited imaging of the kidneys reveal no calculi or obstruction. Right renal cyst measures 18 mm.  IMPRESSION: Mild lumbar disc bulging.  No significant spinal stenosis or focal disc protrusion.  Negative for lumbar fracture.   Original Report Authenticated By: Camelia Phenes, M.D.      1. Left hip pain   2. Weak urinary stream       MDM  4:33 PM Pt seen and examined. Pt with about a week of left hip pain and now 2-3 days of decreased stream of urine. Concern for urinary  retention and early spinal cord problem. Good rectal tone at this time. Will get MRI.  Pt has defib. Will get CT scan instead.   Minimal urine removed when foley placed. Several bulging discs seen, but no need for acute intervention at this time. Pt given strong return precautions and lists of doctors to follow with.         Daleen Bo, MD 09/02/12 [phone]

## 2012-09-01 NOTE — ED Notes (Signed)
Patient c/o mild urinary retention and back pain.  Patient denies burning or pain when she urinates.

## 2012-09-04 NOTE — ED Provider Notes (Signed)
I saw and evaluated the patient, reviewed the resident's note and I agree with the findings and plan.   Patient seen by me, ideally needs MRI but has Defib pacemaker, will get CT. CT without significant findings. [facility] resident who will try to get patient into their clinic for follow up.     Shelda Jakes, MD 09/04/12 [phone]

## 2012-09-21 ENCOUNTER — Ambulatory Visit: Payer: Medicaid Other | Admitting: Obstetrics & Gynecology

## 2012-09-22 ENCOUNTER — Other Ambulatory Visit: Payer: Self-pay | Admitting: Cardiovascular Disease

## 2012-09-22 MED ORDER — CARVEDILOL 25 MG PO TABS: 12.5000 mg | ORAL_TABLET | Freq: Every day | ORAL | Status: DC

## 2012-09-23 ENCOUNTER — Encounter: Payer: Self-pay | Admitting: Obstetrics & Gynecology

## 2012-09-23 ENCOUNTER — Ambulatory Visit (INDEPENDENT_AMBULATORY_CARE_PROVIDER_SITE_OTHER): Payer: Medicaid Other | Admitting: Obstetrics & Gynecology

## 2012-09-23 VITALS — BP 128/86 | HR 63 | Temp 97.0°F | Ht 63.0 in | Wt 163.0 lb

## 2012-09-23 DIAGNOSIS — Z30431 Encounter for routine checking of intrauterine contraceptive device: Secondary | ICD-10-CM

## 2012-09-23 NOTE — Progress Notes (Signed)
Subjective:     Patient ID: Angela Payne, female   DOB: [birth year], 51 y.o.   MRN: [id number]  HPI Ms. Angela Payne is a 51yo [id number] female who presents today for check of her IUD. Her IUD was placed approximately 2 years ago. Ms. Angela Payne does not have any complaints today, she is feeling well and is not experiencing any pain or discomfort from her IUD. She has not had any vaginal bleeding since the placement of her IUD. Ms. Angela Payne states that she is "paranoid" about having an IUD and likes having it checked every few months. Her IUD was checked 3 months ago, strings were visualized, and she was told that her IUD was properly in place with no abnormalities.  Ms. Angela Payne is interested in discussing options for tubal ligation in the future. She is not interested in surgery at this time.  Review of Systems  Constitutional: Negative for fever and chills.  Gastrointestinal: Negative for nausea, vomiting and abdominal pain.  Genitourinary: Negative for vaginal bleeding, vaginal discharge, vaginal pain, menstrual problem and pelvic pain.       Objective:   Physical Exam  Constitutional: She appears well-developed and well-nourished. No distress.  Neck: Neck supple.  Cardiovascular: Normal rate and regular rhythm.   Pulmonary/Chest: Effort normal and breath sounds normal.  Abdominal: Soft. There is no tenderness.  Genitourinary: Pelvic exam was performed with patient supine. There is no rash, tenderness or lesion on the right labia. There is no rash, tenderness or lesion on the left labia. Cervix exhibits no motion tenderness and no friability. No erythema, tenderness or bleeding around the vagina.    Lymphadenopathy:    She has no cervical adenopathy.       Assessment:     IUD in place    Plan:     - Recheck IUD placement in 1 year or sooner if pt has any problems Although unable to visualize cervical os and IUD strings today, pt had a complete physical and pelvic exam that revealed  proper placement of the IUD 3 months ago. Pt is not complaining of any symptoms so there is no reason to be concerned about IUD placement. Pt told that we could attempt to better visualize her cervix with a different speculum, but pt did not wish to proceed and terminated the visit.  - Patient education Pt educated about tubal ligation options. Pt will call if she is interested in discussing more in the future. Pt educated about not needing IUD checks several times a year. Pt agrees with the plan of only having her IUD checked yearly.  - Patient will follow up as needed    Angela Payne L. Erin Fulling, M.D., [facility]  Angela Masson, PA-S2

## 2012-09-23 NOTE — Patient Instructions (Signed)

## 2012-09-29 ENCOUNTER — Ambulatory Visit: Payer: Medicaid Other | Admitting: Cardiovascular Disease

## 2012-10-14 ENCOUNTER — Ambulatory Visit (HOSPITAL_COMMUNITY): Payer: Medicaid Other

## 2012-10-21 ENCOUNTER — Encounter: Payer: Medicaid Other | Admitting: Internal Medicine

## 2012-10-26 ENCOUNTER — Ambulatory Visit (INDEPENDENT_AMBULATORY_CARE_PROVIDER_SITE_OTHER): Payer: Medicaid Other | Admitting: Cardiovascular Disease

## 2012-10-26 ENCOUNTER — Encounter: Payer: Self-pay | Admitting: Cardiovascular Disease

## 2012-10-26 VITALS — BP 120/80 | HR 80 | Wt 161.0 lb

## 2012-10-26 DIAGNOSIS — I428 Other cardiomyopathies: Secondary | ICD-10-CM

## 2012-10-26 DIAGNOSIS — I5022 Chronic systolic (congestive) heart failure: Secondary | ICD-10-CM

## 2012-10-26 DIAGNOSIS — I509 Heart failure, unspecified: Secondary | ICD-10-CM

## 2012-10-26 NOTE — Patient Instructions (Addendum)
Your physician wants you to follow-up in:  12 months. You will receive a reminder letter in the mail two months in advance. If you don't receive a letter, please call our office to schedule the follow-up appointment.  Your physician recommends that you return for fasting lab work  Next week. The lab opens at 7:30 daily.

## 2012-10-26 NOTE — Progress Notes (Signed)
History of Present Illness: 51 yo AAF with h/o NICM, prior VF arrest August 2011, PAF here today for cardiac follow up. She was admitted to [facility] August 2011 after an out of hospital V. Fib arrest. She was cooled on the Arctic sun protocol and underwent a cardiac cath which showed no evidence of obstructive CAD. Echo showed EF of 15%. Cardiac MRI showed EF of 29%. 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 She had an ICD implanted by Dr. Graciela Husbands on July 18, 2010. I last saw her in February 2013 but she was seen in here in August 2013 by Ward Givens, NP. She has had chronic chest pain since her cardiac arrest.   She is here for folllow up. She describes overall lack of energy. No exertional chest pain. She describes dyspnea at times. No change in weight. No near syncope, syncope, dizziness, orthopnea or PND. She has been taking all of her medications.   Primary Care Physician: [facility]  Last Lipid Profile:Lipid Panel     Component Value Date/Time   CHOL 212* 02/11/2009 2038   TRIG 43 02/11/2009 2038   HDL 55 02/11/2009 2038   CHOLHDL 3.9 Ratio 02/11/2009 2038   VLDL 9 02/11/2009 2038   LDLCALC 148* 02/11/2009 2038     Past Medical History  Diagnosis Date  . Other left bundle branch block   . Automatic implantable cardiac defibrillator in situ     a. 06/2010 s/p  s/p SJM 3231-40 Bi V ICD Ser # [id number].  Marland Kitchen Chronic systolic heart failure   . Anemia, unspecified   . Paroxysmal supraventricular tachycardia   . Nonischemic cardiomyopathy     a. 06/2010 OOH VF arrest;  b. 06/2010 Cath: nonobs dzs, EF 15-20%, glob HK;  c. 06/2010 Echo: EF 15%, diff HK, Gr 2 DD;  d. 06/2010 Cardiac MRI EF of 29%, no scar;  e. 06/2010 s/p SJM 3231-40 Bi V ICD Ser # [id number].  Marland Kitchen Atrial fibrillation     a. This occurred in setting of resuscitation efforts during VF arrest.  . Cardiac arrest     a. 06/2010  . History of tobacco abuse   .  History of cocaine abuse     remote    Past Surgical History  Procedure Date  . Cardiac defibrillator placement     [facility] CD Q323020 Q defib, serial number [id number]    Current Outpatient Prescriptions  Medication Sig Dispense Refill  . albuterol (PROVENTIL HFA;VENTOLIN HFA) 108 (90 BASE) MCG/ACT inhaler Inhale 2 puffs into the lungs every 4 (four) hours as needed. For shortness of breath or wheezing      . aspirin 81 MG chewable tablet Chew 81 mg by mouth daily.      . carvedilol (COREG) 25 MG tablet Take 0.5 tablets (12.5 mg total) by mouth daily.  15 tablet  3  . furosemide (LASIX) 40 MG tablet Take 40 mg by mouth.      Marland Kitchen HYDROcodone-acetaminophen (NORCO/VICODIN) 5-325 MG per tablet Take 2 tablets by mouth every 4 (four) hours as needed for pain.  10 tablet  0  . lisinopril (PRINIVIL,ZESTRIL) 5 MG tablet Take 1 tablet (5 mg total) by mouth daily.  30 tablet  6  . Multiple Vitamin (MULTIVITAMIN WITH MINERALS) TABS Take 1 tablet by mouth daily.      . potassium chloride SA (K-DUR,KLOR-CON) 20 MEQ tablet Take 1 tablet (20 mEq total) by mouth  2 (two) times daily.  60 tablet  3  . promethazine (PHENERGAN) 25 MG tablet Take 25 mg by mouth every 8 (eight) hours as needed. For nausea      . traMADol (ULTRAM) 50 MG tablet Take 50 mg by mouth every 6 (six) hours as needed. FOR PAIN        No Known Allergies  History   Social History  . Marital Status: Divorced    Spouse Name: N/A    Number of Children: 4  . Years of Education: N/A   Occupational History  . [employer]     [employer]   Social History Main Topics  . Smoking status: Former Smoker    Quit date: 07/28/2011  . Smokeless tobacco: Never Used     Comment: quit 2 weeks ago  . Alcohol Use: No  . Drug Use: No     Comment: history of cocaine abuse last used 2008  . Sexually Active: Yes    Birth Control/ Protection: IUD   Other Topics Concern  . Not on file   Social History Narrative  . No narrative on file    Family  History  Problem Relation Age of Onset  . Heart disease Father     Review of Systems:  As stated in the HPI and otherwise negative.   BP 120/80  Pulse 80  Wt 161 lb (73.029 kg)  Physical Examination: General: Well developed, well nourished, NAD HEENT: OP clear, mucus membranes moist SKIN: warm, dry. No rashes. Neuro: No focal deficits Musculoskeletal: Muscle strength 5/5 all ext Psychiatric: Mood and affect normal Neck: No JVD, no carotid bruits, no thyromegaly, no lymphadenopathy. Lungs:Clear bilaterally, no wheezes, rhonci, crackles Cardiovascular: Regular rate and rhythm. No murmurs, gallops or rubs. Abdomen:Soft. Bowel sounds present. Non-tender.  Extremities: No lower extremity edema. Pulses are 2 + in the bilateral DP/PT.  Assessment and Plan:   1. NICM: She is on good medical therapy. Blood pressure is well controlled. She will need lipids checked. Will arrange today. No changes in therapy today.   2. SYSTOLIC HEART FAILURE, CHRONIC: Volume status is normal today. Weight has been stable.Continue to follow daily weights at home.  Will continue Lasix Qdaily and potassium supplementation. Dr. Graciela Husbands follows her ICD.

## 2012-10-28 ENCOUNTER — Emergency Department (HOSPITAL_COMMUNITY)
Admission: EM | Admit: 2012-10-28 | Discharge: 2012-10-28 | Disposition: A | Discharge status: Home / Self Care | Payer: Medicaid Other | Attending: Emergency Medicine | Admitting: Emergency Medicine

## 2012-10-28 ENCOUNTER — Encounter (HOSPITAL_COMMUNITY): Payer: Self-pay | Admitting: *Deleted

## 2012-10-28 DIAGNOSIS — G8929 Other chronic pain: Secondary | ICD-10-CM | POA: Insufficient documentation

## 2012-10-28 DIAGNOSIS — M549 Dorsalgia, unspecified: Secondary | ICD-10-CM | POA: Insufficient documentation

## 2012-10-28 DIAGNOSIS — Z7982 Long term (current) use of aspirin: Secondary | ICD-10-CM | POA: Insufficient documentation

## 2012-10-28 DIAGNOSIS — R079 Chest pain, unspecified: Secondary | ICD-10-CM | POA: Insufficient documentation

## 2012-10-28 DIAGNOSIS — I5020 Unspecified systolic (congestive) heart failure: Secondary | ICD-10-CM | POA: Insufficient documentation

## 2012-10-28 DIAGNOSIS — Z862 Personal history of diseases of the blood and blood-forming organs and certain disorders involving the immune mechanism: Secondary | ICD-10-CM | POA: Insufficient documentation

## 2012-10-28 DIAGNOSIS — Z95810 Presence of automatic (implantable) cardiac defibrillator: Secondary | ICD-10-CM | POA: Insufficient documentation

## 2012-10-28 DIAGNOSIS — Z87891 Personal history of nicotine dependence: Secondary | ICD-10-CM | POA: Insufficient documentation

## 2012-10-28 DIAGNOSIS — I447 Left bundle-branch block, unspecified: Secondary | ICD-10-CM | POA: Insufficient documentation

## 2012-10-28 DIAGNOSIS — K5900 Constipation, unspecified: Secondary | ICD-10-CM

## 2012-10-28 DIAGNOSIS — E079 Disorder of thyroid, unspecified: Secondary | ICD-10-CM | POA: Insufficient documentation

## 2012-10-28 DIAGNOSIS — Z8679 Personal history of other diseases of the circulatory system: Secondary | ICD-10-CM | POA: Insufficient documentation

## 2012-10-28 DIAGNOSIS — I4891 Unspecified atrial fibrillation: Secondary | ICD-10-CM | POA: Insufficient documentation

## 2012-10-28 DIAGNOSIS — Z79899 Other long term (current) drug therapy: Secondary | ICD-10-CM | POA: Insufficient documentation

## 2012-10-28 HISTORY — DX: Disorder of thyroid, unspecified: E07.9

## 2012-10-28 MED ORDER — POLYETHYLENE GLYCOL 3350 17 G PO PACK: 17.0000 g | PACK | Freq: Every day | ORAL | Status: DC

## 2012-10-28 NOTE — ED Notes (Signed)
Pt states that she has had difficulty having bowel movements. Pt states that she will only have small amounts at a time. Pt denies any other symptoms.

## 2012-10-28 NOTE — ED Provider Notes (Signed)
History     CSN: [id number]  Arrival date & time 10/28/12  0745   First MD Initiated Contact with Patient 10/28/12 [phone]      Chief Complaint  Patient presents with  . Constipation    (Consider location/radiation/quality/duration/timing/severity/associated sxs/prior treatment) HPI Comments: Patient presents with a one-week history of constipation. She states that she has pressure feeling in her rectum and small hard bowel movements. She does take ongoing narcotic pain medicine related to some chronic chest and back pain she has. She's been taking over-the-counter laxatives with some improvement but still feels pressure with bowel movements. She denies abdominal pain. She denies he nausea vomiting or diarrhea. Denies any loss of bowel or bladder control. Denies any fevers or chills. Denies any noticeable blood in her stool.  Patient is a 51 y.o. female presenting with constipation.  Constipation  Pertinent negatives include no fever, no abdominal pain, no diarrhea, no nausea, no vomiting, no hematuria, no chest pain, no headaches, no coughing and no rash.    Past Medical History  Diagnosis Date  . Other left bundle branch block   . Automatic implantable cardiac defibrillator in situ     a. 06/2010 s/p  s/p SJM 3231-40 Bi V ICD Ser # [id number].  Marland Kitchen Chronic systolic heart failure   . Anemia, unspecified   . Paroxysmal supraventricular tachycardia   . Nonischemic cardiomyopathy     a. 06/2010 OOH VF arrest;  b. 06/2010 Cath: nonobs dzs, EF 15-20%, glob HK;  c. 06/2010 Echo: EF 15%, diff HK, Gr 2 DD;  d. 06/2010 Cardiac MRI EF of 29%, no scar;  e. 06/2010 s/p SJM 3231-40 Bi V ICD Ser # [id number].  Marland Kitchen Atrial fibrillation     a. This occurred in setting of resuscitation efforts during VF arrest.  . Cardiac arrest     a. 06/2010  . History of tobacco abuse   . History of cocaine abuse     remote  . Thyroid disease     Past Surgical History  Procedure Date  . Cardiac defibrillator placement    [facility] CD Q323020 Q defib, serial number [id number]    Family History  Problem Relation Age of Onset  . Heart disease Father     History  Substance Use Topics  . Smoking status: Former Smoker    Quit date: 07/28/2011  . Smokeless tobacco: Never Used     Comment: quit 2 weeks ago  . Alcohol Use: No    OB History    Grav Para Term Preterm Abortions TAB SAB Ect Mult Living   4 4 4       4       Review of Systems  Constitutional: Negative for fever, chills, diaphoresis and fatigue.  HENT: Negative for congestion, rhinorrhea and sneezing.   Eyes: Negative.   Respiratory: Negative for cough, chest tightness and shortness of breath.   Cardiovascular: Negative for chest pain and leg swelling.  Gastrointestinal: Positive for constipation. Negative for nausea, vomiting, abdominal pain, diarrhea and blood in stool.  Genitourinary: Negative for frequency, hematuria, flank pain and difficulty urinating.  Musculoskeletal: Positive for back pain. Negative for arthralgias.  Skin: Negative for rash.  Neurological: Negative for dizziness, speech difficulty, weakness, numbness and headaches.    Allergies  Review of patient's allergies indicates no known allergies.  Home Medications   Current Outpatient Rx  Name  Route  Sig  Dispense  Refill  . ALBUTEROL SULFATE HFA 108 (90 BASE) MCG/ACT IN AERS  Inhalation   Inhale 2 puffs into the lungs every 4 (four) hours as needed. For shortness of breath or wheezing         . ASPIRIN 81 MG PO CHEW   Oral   Chew 81 mg by mouth daily.         Marland Kitchen CARVEDILOL 25 MG PO TABS   Oral   Take 0.5 tablets (12.5 mg total) by mouth daily.   15 tablet   3   . FUROSEMIDE 40 MG PO TABS   Oral   Take 40 mg by mouth.         Marland Kitchen HYDROCODONE-ACETAMINOPHEN 5-325 MG PO TABS   Oral   Take 2 tablets by mouth every 4 (four) hours as needed for pain.   10 tablet   0   . LISINOPRIL 5 MG PO TABS   Oral   Take 1 tablet (5 mg total) by mouth daily.   30  tablet   6   . ADULT MULTIVITAMIN W/MINERALS CH   Oral   Take 1 tablet by mouth daily.         Marland Kitchen POTASSIUM CHLORIDE CRYS ER 20 MEQ PO TBCR   Oral   Take 1 tablet (20 mEq total) by mouth 2 (two) times daily.   60 tablet   3   . PROMETHAZINE HCL 25 MG PO TABS   Oral   Take 25 mg by mouth every 8 (eight) hours as needed. For nausea         . TRAMADOL HCL 50 MG PO TABS   Oral   Take 50 mg by mouth every 6 (six) hours as needed. FOR PAIN         . POLYETHYLENE GLYCOL 3350 PO PACK   Oral   Take 17 g by mouth daily.   14 each   0     BP 121/75  Pulse 70  Temp 97.8 F (36.6 C) (Oral)  Resp 18  SpO2 100%  Physical Exam  Constitutional: She is oriented to person, place, and time. She appears well-developed and well-nourished.  HENT:  Head: Normocephalic and atraumatic.  Eyes: Pupils are equal, round, and reactive to light.  Neck: Normal range of motion. Neck supple.  Cardiovascular: Normal rate, regular rhythm and normal heart sounds.   Pulmonary/Chest: Effort normal and breath sounds normal. No respiratory distress. She has no wheezes. She has no rales. She exhibits no tenderness.  Abdominal: Soft. Bowel sounds are normal. There is no tenderness. There is no rebound and no guarding.  Genitourinary:       Patient is no significant stool in the rectal vault. No pain in the perirectal area. She has a small nonthrombosed external hemorrhoid. There is no gross blood or melena.  Musculoskeletal: Normal range of motion. She exhibits no edema.  Lymphadenopathy:    She has no cervical adenopathy.  Neurological: She is alert and oriented to person, place, and time.  Skin: Skin is warm and dry. No rash noted.  Psychiatric: She has a normal mood and affect.    ED Course  Procedures (including critical care time)  Labs Reviewed - No data to display No results found.   1. Constipation       MDM  Patient with no evidence of impaction. No abdominal pain or vomiting to  suggest obstruction. I will give her prescription for MiraLAX and I advised her to start a daily fiber supplement while she's taking narcotic pain medications. Advised to followup with  her primary care physician if her symptoms are not improved within next week.        Rolan Bucco, MD 10/28/12 [phone]

## 2012-10-31 ENCOUNTER — Telehealth: Payer: Self-pay | Admitting: Cardiovascular Disease

## 2012-10-31 MED ORDER — LISINOPRIL 5 MG PO TABS: 5.0000 mg | ORAL_TABLET | Freq: Every day | ORAL | Status: DC

## 2012-10-31 MED ORDER — POTASSIUM CHLORIDE CRYS ER 20 MEQ PO TBCR: 20.0000 meq | EXTENDED_RELEASE_TABLET | Freq: Two times a day (BID) | ORAL | Status: DC

## 2012-10-31 NOTE — Telephone Encounter (Signed)
Pt needs refill of klor con and lisinopril to [facility] [place]

## 2012-11-01 ENCOUNTER — Other Ambulatory Visit: Payer: Medicaid Other

## 2012-11-04 ENCOUNTER — Encounter: Payer: Self-pay | Admitting: *Deleted

## 2012-11-09 ENCOUNTER — Telehealth: Payer: Self-pay | Admitting: *Deleted

## 2012-11-09 DIAGNOSIS — Z1239 Encounter for other screening for malignant neoplasm of breast: Secondary | ICD-10-CM

## 2012-11-09 NOTE — Telephone Encounter (Signed)
Lynden Ang called from radiology stating patient says she now has medicaid, if she does , we need to put in order- need to verify- call Lynden Ang and let her know if she medicaid or not and put in order if she has medicaid

## 2012-11-09 NOTE — Telephone Encounter (Signed)
Registration verified patient now has medicaid, order given and notified mammography

## 2012-11-09 NOTE — Telephone Encounter (Signed)
Message copied by Mannie Stabile on Wed Nov 09, 2012 11:26 AM ------      Message from: Odelia Gage A      Created: Wed Nov 09, 2012 10:11 AM       She does have medicaid.

## 2012-11-10 ENCOUNTER — Other Ambulatory Visit (INDEPENDENT_AMBULATORY_CARE_PROVIDER_SITE_OTHER): Payer: Medicaid Other

## 2012-11-10 ENCOUNTER — Encounter: Payer: Self-pay | Admitting: Internal Medicine

## 2012-11-10 ENCOUNTER — Ambulatory Visit (INDEPENDENT_AMBULATORY_CARE_PROVIDER_SITE_OTHER): Payer: Medicaid Other | Admitting: Internal Medicine

## 2012-11-10 VITALS — BP 115/77 | HR 81 | Resp 18 | Ht 63.0 in | Wt 165.0 lb

## 2012-11-10 DIAGNOSIS — I428 Other cardiomyopathies: Secondary | ICD-10-CM

## 2012-11-10 DIAGNOSIS — I5022 Chronic systolic (congestive) heart failure: Secondary | ICD-10-CM

## 2012-11-10 DIAGNOSIS — I509 Heart failure, unspecified: Secondary | ICD-10-CM

## 2012-11-10 DIAGNOSIS — Z95810 Presence of automatic (implantable) cardiac defibrillator: Secondary | ICD-10-CM

## 2012-11-10 DIAGNOSIS — R5381 Other malaise: Secondary | ICD-10-CM

## 2012-11-10 DIAGNOSIS — R5383 Other fatigue: Secondary | ICD-10-CM | POA: Insufficient documentation

## 2012-11-10 LAB — ICD DEVICE OBSERVATION
AL AMPLITUDE: 3.6 mv
DEVICE MODEL ICD: 621486
FVT: 0
HV IMPEDENCE: 44 Ohm
LV LEAD THRESHOLD: 2 V
MODE SWITCH EPISODES: 1
RV LEAD AMPLITUDE: 12 mv
RV LEAD THRESHOLD: 0.5 V
TOT-0008: 0
TOT-0010: 9
TZAT-0013SLOWVT: 3
TZAT-0018SLOWVT: NEGATIVE
TZAT-0020SLOWVT: 1 ms
TZON-0003SLOWVT: 300 ms
TZON-0005SLOWVT: 6
TZON-0010SLOWVT: 40 ms
TZST-0001SLOWVT: 2
TZST-0001SLOWVT: 4
TZST-0003SLOWVT: 845 V
TZST-0003SLOWVT: 890 V
VENTRICULAR PACING ICD: 99.39 pct
VF: 0

## 2012-11-10 LAB — LIPID PANEL: Cholesterol: 179 mg/dL (ref 0–200)

## 2012-11-10 MED ORDER — ISOSORB DINITRATE-HYDRALAZINE 20-37.5 MG PO TABS: 1.0000 | ORAL_TABLET | Freq: Two times a day (BID) | ORAL | Status: DC

## 2012-11-10 NOTE — Assessment & Plan Note (Addendum)
Patient currently on beta blockers and ACE inhibitors. Beta blockers may be contributing to her fatigue. It may be worth trying metoprolol succinate. For now, we will add hydralazine/nitrates on a twice a day basis. Will also refer her to the heart failure clinic for their input t.

## 2012-11-10 NOTE — Assessment & Plan Note (Signed)
This may be related to her beta blockers; alternatively she is in a high demographic group for sleep apnea. We will undertake a sleep study

## 2012-11-10 NOTE — Patient Instructions (Signed)
Start Bidil twice daily.  You have been referred to [facility] to see Dr. Nicholes Mango.   Your physician has recommended that you have a sleep study. This test records several body functions during sleep, including: brain activity, eye movement, oxygen and carbon dioxide blood levels, heart rate and rhythm, breathing rate and rhythm, the flow of air through your mouth and nose, snoring, body muscle movements, and chest and belly movement.

## 2012-11-10 NOTE — Assessment & Plan Note (Signed)
The patient's device was interrogated.  The information was reviewed. No changes were made in the programming.    

## 2012-11-10 NOTE — Progress Notes (Signed)
Patient Care Team: Dellis Anes as PCP - General (Family Medicine)   HPI  Angela Payne is a 51 y.o. female Seen in followup for aborted cardiac arrest August 2011. She has nonischemic cardiomyopathy depressed left ventricular function and left bundle branch block. She is status post CRT-D implantation.  She struggled with fatigue and exercise intolerance. She has daytime somnolence. Does not have peripheral edema.  Past Medical History  Diagnosis Date  . Other left bundle branch block   . Automatic implantable cardiac defibrillator in situ     a. 06/2010 s/p  s/p SJM 3231-40 Bi V ICD Ser # [id number].  Marland Kitchen Chronic systolic heart failure   . Anemia, unspecified   . Paroxysmal supraventricular tachycardia   . Nonischemic cardiomyopathy     a. 06/2010 OOH VF arrest;  b. 06/2010 Cath: nonobs dzs, EF 15-20%, glob HK;  c. 06/2010 Echo: EF 15%, diff HK, Gr 2 DD;  d. 06/2010 Cardiac MRI EF of 29%, no scar;  e. 06/2010 s/p SJM 3231-40 Bi V ICD Ser # [id number].  Marland Kitchen Atrial fibrillation     a. This occurred in setting of resuscitation efforts during VF arrest.  . Cardiac arrest     a. 06/2010  . History of tobacco abuse   . History of cocaine abuse     remote  . Thyroid disease     Past Surgical History  Procedure Date  . Cardiac defibrillator placement     [facility] [place] CD [id number] Q defib, serial number [id number]    Current Outpatient Prescriptions  Medication Sig Dispense Refill  . albuterol (PROVENTIL HFA;VENTOLIN HFA) 108 (90 BASE) MCG/ACT inhaler Inhale 2 puffs into the lungs every 4 (four) hours as needed. For shortness of breath or wheezing      . aspirin 81 MG chewable tablet Chew 81 mg by mouth daily.      . carvedilol (COREG) 25 MG tablet Take 0.5 tablets (12.5 mg total) by mouth daily.  15 tablet  3  . furosemide (LASIX) 40 MG tablet Take 40 mg by mouth.      Marland Kitchen HYDROcodone-acetaminophen (NORCO/VICODIN) 5-325 MG per tablet Take 2 tablets by mouth every 4 (four) hours as needed for pain.  10  tablet  0  . lisinopril (PRINIVIL,ZESTRIL) 5 MG tablet Take 1 tablet (5 mg total) by mouth daily.  30 tablet  6  . Multiple Vitamin (MULTIVITAMIN WITH MINERALS) TABS Take 1 tablet by mouth daily.      . polyethylene glycol (MIRALAX) packet Take 17 g by mouth daily.  14 each  0  . potassium chloride SA (K-DUR,KLOR-CON) 20 MEQ tablet Take 1 tablet (20 mEq total) by mouth 2 (two) times daily.  60 tablet  3  . promethazine (PHENERGAN) 25 MG tablet Take 25 mg by mouth every 8 (eight) hours as needed. For nausea      . traMADol (ULTRAM) 50 MG tablet Take 50 mg by mouth every 6 (six) hours as needed. FOR PAIN        No Known Allergies  Review of Systems negative except from HPI and PMH  Physical Exam BP 115/77  Pulse 81  Resp 18  Ht 5\' 3"  (1.6 m)  Wt 165 lb (74.844 kg)  BMI 29.23 kg/m2  SpO2 96% Well developed and well nourished in no acute distress HENT normal E scleral and icterus clear Neck Supple JVP flat; carotids brisk and full Clear to ausculation  Regular rate and rhythm, no murmurs gallops or rub Soft  with active bowel sounds No clubbing cyanosis none Edema Alert and oriented, grossly normal motor and sensory function Skin Warm and Dry    Assessment and  Plan

## 2012-11-10 NOTE — Assessment & Plan Note (Signed)
She is euvolemic. 

## 2012-11-15 ENCOUNTER — Ambulatory Visit (HOSPITAL_COMMUNITY)
Admission: RE | Admit: 2012-11-15 | Discharge: 2012-11-15 | Disposition: A | Discharge status: Home / Self Care | Payer: Medicaid Other | Source: Ambulatory Visit | Attending: Family Medicine | Admitting: Family Medicine

## 2012-11-15 DIAGNOSIS — Z1231 Encounter for screening mammogram for malignant neoplasm of breast: Secondary | ICD-10-CM | POA: Insufficient documentation

## 2012-11-21 ENCOUNTER — Encounter: Payer: Self-pay | Admitting: Obstetrics & Gynecology

## 2012-11-21 ENCOUNTER — Other Ambulatory Visit (HOSPITAL_COMMUNITY)
Admission: RE | Admit: 2012-11-21 | Discharge: 2012-11-21 | Disposition: A | Discharge status: Home / Self Care | Payer: Medicaid Other | Source: Ambulatory Visit | Attending: Obstetrics & Gynecology | Admitting: Obstetrics & Gynecology

## 2012-11-21 ENCOUNTER — Ambulatory Visit (INDEPENDENT_AMBULATORY_CARE_PROVIDER_SITE_OTHER): Payer: Medicaid Other | Admitting: Obstetrics & Gynecology

## 2012-11-21 VITALS — BP 107/75 | HR 78 | Temp 96.7°F | Ht 63.0 in | Wt 164.9 lb

## 2012-11-21 DIAGNOSIS — N871 Moderate cervical dysplasia: Secondary | ICD-10-CM | POA: Insufficient documentation

## 2012-11-21 DIAGNOSIS — R87610 Atypical squamous cells of undetermined significance on cytologic smear of cervix (ASC-US): Secondary | ICD-10-CM

## 2012-11-21 LAB — POCT PREGNANCY, URINE: Preg Test, Ur: NEGATIVE

## 2012-11-21 NOTE — Addendum Note (Signed)
Addended by: Franchot Mimes on: 11/21/2012 04:56 PM   Modules accepted: Orders

## 2012-11-21 NOTE — Patient Instructions (Addendum)
Colposcopy Colposcopy is a procedure that uses a special lighted microscope (colposcope). It examines your cervix and vagina, or the area around the outside of the vagina, for signs of disease or abnormalities in the cells. You may be sent to a specialist (gynecologist) to do the colposcopy. A biopsy (tissue sample) may be collected during a colposcopy, if the caregiver finds any unusual cells. The biopsy is sent to the lab for further testing, and the results are reported back to your caregiver. A WOMAN MAY NEED THIS PROCEDURE IF:  She has had an abnormal pap smear (taking cells from the cervix for testing).  She has a sore on her cervix, and a Pap test was normal.  The Pap test suggests human papilloma virus (HPV). This virus can cause genital warts and is linked to the development of cervical cancer.  She has genital warts on the cervix, or in or around the outside of the vagina.  Her mother took the drug DES while pregnant.  She has painful intercourse.  She has vaginal bleeding, especially after sexual intercourse.  There is a need to evaluate the results of previous treatment. BEFORE THE PROCEDURE   Colposcopy is done when you are not having a menstrual period.  For 24 hours before the colposcopy, do not:  Douche.  Use tampons.  Use medicines, creams, or suppositories in the vagina.  Have sexual intercourse. PROCEDURE   A colposcopy is done while a woman is lying on her back with her feet in foot rests (stirrups).  A speculum is placed inside the vagina to keep it open and to allow the caregiver to see the cervix. This is the same instrument used to do a pap smear.  The colposcope is placed outside the vagina. It is used to magnify and examine the cervix, vagina, and the area around the outside of the vagina.  A small amount of liquid solution is placed on the area that is to be viewed. This solution is placed on with a cotton applicator. This solution makes it easier to  see the abnormal cells.  Your caregiver will suck out mucus and cells from the canal of the cervix.  Small pieces of tissue for biopsy may be taken at the same time. You may feel mild pain or discomfort when this is done.  Your caregiver will record the location of the abnormal areas and send the tissue samples to a lab for analysis.  If your caregiver biopsies the vagina or outside of the vagina, a local anesthetic (novocaine) is usually given. AFTER THE PROCEDURE   You may have some cramping that often goes away in a few minutes. You may have some soreness for a couple of days.  You may take over-the-counter pain medicine as advised by your caregiver. Do not take aspirin because it can cause bleeding.  Lie down for a few minutes if you feel lightheaded.  You may have some bleeding or dark discharge that should stop in a few days.  You may need to wear a sanitary pad for a few days. HOME CARE INSTRUCTIONS   Avoid sex, douching, and using tampons for a week or as directed.  Only take medicine as directed by your caregiver.  Continue to take birth control pills, if you are on them.  Not all test results are available during your visit. If your test results are not back during the visit, make an appointment with your caregiver to find out the results. Do not assume everything is  normal if you have not heard from your caregiver or the medical facility. It is important for you to follow up on all of your test results.  Follow your caregiver's advice regarding medicines, activity, follow-up visits, and follow-up Pap tests. SEEK MEDICAL CARE IF:   You develop a rash.  You have problems with your medicine. SEEK IMMEDIATE MEDICAL CARE IF:  You are bleeding heavily or are passing blood clots.  You develop a fever over 102 F (38.9 C), with or without chills.  You have abnormal vaginal discharge.  You are having cramps that do not go away after taking your pain medicine.  You  feel lightheaded, dizzy, or faint.  You develop stomach pain. Document Released: 02/06/2003 Document Revised: 02/08/2012 Document Reviewed: 09/19/2009 [facility] Patient Information 2013 [place], [place]. Colposcopy Care After Colposcopy is a procedure in which a special tool is used to magnify the surface of the cervix. A tissue sample (biopsy) may also be taken. This sample will be looked at for cervical cancer or other problems. After the test:  You may have some cramping.  Lie down for a few minutes if you feel lightheaded.   You may have some bleeding which should stop in a few days. HOME CARE  Do not have sex or use tampons for 2 to 3 days or as told.  Only take medicine as told by your doctor.  Continue to take your birth control pills as usual. Finding out the results of your test Ask when your test results will be ready. Make sure you get your test results. GET HELP RIGHT AWAY IF:  You are bleeding a lot or are passing blood clots.  You develop a fever of 102 F (38.9 C) or higher.  You have abnormal vaginal discharge.  You have cramps that do not go away with medicine.  You feel lightheaded, dizzy, or pass out (faint). MAKE SURE YOU:   Understand these instructions.  Will watch your condition.  Will get help right away if you are not doing well or get worse. Document Released: 05/04/2008 Document Revised: 02/08/2012 Document Reviewed: 05/04/2008 [facility] Patient Information 2013 [place], [place]. HPV Test The HPV (human papillomavirus) test is used to screen for high-risk types with HPV infection. HPV is a group of about 100 related viruses, of which 40 types are genital viruses. Most HPV viruses cause infections that usually resolve without treatment within 2 years. Some HPV infections can cause skin and genital warts (condylomata). HPV types 16, 18, 31 and 45 are considered high-risk types of HPV. High-risk types of HPV do not usually cause visible warts, but if  untreated, may lead to cancers of the outlet of the womb (cervix) or anus. An HPV test identifies the DNA (genetic) strands of the HPV infection. Because the test identifies the DNA strands, the test is also referred to as the HPV DNA test. Although HPV is found in both males and females, the HPV test is only used to screen for cervical cancer in females. This test is recommended for females:  With an abnormal Pap test.  After treatment of an abnormal Pap test.  Aged 53 and older.  After treatment of a high-risk HPV infection. The HPV test may be done at the same time as a Pap test in females over the age of 45. Both the HPV and Pap test require a sample of cells from the cervix. PREPARATION FOR TEST  You may be asked to avoid douching, tampons, or vaginal medicines for 48  hours before the HPV test. You will be asked to urinate before the test. For the HPV test, you will need to lie on an exam table with your feet in stirrups. A spatula will be inserted into the vagina. The spatula will be used to swab the cervix for a cell and mucus sample. The sample will be further evaluated in a lab under a microscope. NORMAL FINDINGS  Normal: High-risk HPV is not found.  Ranges for normal findings may vary among different laboratories and hospitals. You should always check with your doctor after having lab work or other tests done to discuss the meaning of your test results and whether your values are considered within normal limits. MEANING OF TEST An abnormal HPV test means that high-risk HPV is found. Your caregiver may recommend further testing. Your caregiver will go over the test results with you. He or she will and discuss the importance and meaning of your results, as well as treatment options and the need for additional tests, if necessary. OBTAINING THE RESULTS  It is your responsibility to obtain your test results. Ask the lab or department performing the test when and how you will get your  results. Document Released: 12/11/2004 Document Revised: 02/08/2012 Document Reviewed: 08/26/2005 [facility] Patient Information 2013 [place], [place].

## 2012-11-21 NOTE — Progress Notes (Signed)
Patient ID: Angela Payne, female   DOB: 17-May-1961, 51 y.o.   MRN: [id number]  Chief Complaint  Patient presents with  . Colposcopy    ASCUS    HPI Angela Payne is a 51 y.o. female.  Pt with ASCUS +HRHPV.  Referred for colpo.  Pt with no prior h/o abnormal PAP HPI  Indications: Pap smear on November 2013 showed: ASCUS with POSITIVE high risk HPV. Previous colposcopy:never had  Past Medical History  Diagnosis Date  . Other left bundle branch block   . Automatic implantable cardiac defibrillator in situ     a. 06/2010 s/p  s/p SJM 3231-40 Bi V ICD Ser # [id number].  Marland Kitchen Chronic systolic heart failure   . Anemia, unspecified   . Paroxysmal supraventricular tachycardia   . Nonischemic cardiomyopathy     a. 06/2010 OOH VF arrest;  b. 06/2010 Cath: nonobs dzs, EF 15-20%, glob HK;  c. 06/2010 Echo: EF 15%, diff HK, Gr 2 DD;  d. 06/2010 Cardiac MRI EF of 29%, no scar;  e. 06/2010 s/p SJM 3231-40 Bi V ICD Ser # [id number].  Marland Kitchen Atrial fibrillation     a. This occurred in setting of resuscitation efforts during VF arrest.  . Cardiac arrest     a. 06/2010  . History of tobacco abuse   . History of cocaine abuse     remote  . Thyroid disease   . SUBSTANCE ABUSE, MULTIPLE 08/13/2010    Qualifier: Diagnosis of  By: Wendee Copp      Past Surgical History  Procedure Date  . Cardiac defibrillator placement     [facility] [place] CD [id number] Q defib, serial number [id number]    Family History  Problem Relation Age of Onset  . Heart disease Father     Social History History  Substance Use Topics  . Smoking status: Former Smoker    Quit date: 07/28/2011  . Smokeless tobacco: Never Used     Comment: quit 2 weeks ago  . Alcohol Use: No    No Known Allergies  Current Outpatient Prescriptions  Medication Sig Dispense Refill  . albuterol (PROVENTIL HFA;VENTOLIN HFA) 108 (90 BASE) MCG/ACT inhaler Inhale 2 puffs into the lungs every 4 (four) hours as needed. For shortness of breath or wheezing      .  aspirin 81 MG chewable tablet Chew 81 mg by mouth daily.      . carvedilol (COREG) 25 MG tablet Take 0.5 tablets (12.5 mg total) by mouth daily.  15 tablet  3  . furosemide (LASIX) 40 MG tablet Take 40 mg by mouth.      Marland Kitchen HYDROcodone-acetaminophen (NORCO/VICODIN) 5-325 MG per tablet Take 2 tablets by mouth every 4 (four) hours as needed for pain.  10 tablet  0  . isosorbide-hydrALAZINE (BIDIL) 20-37.5 MG per tablet Take 1 tablet by mouth 2 (two) times daily.  180 tablet  3  . lisinopril (PRINIVIL,ZESTRIL) 5 MG tablet Take 1 tablet (5 mg total) by mouth daily.  30 tablet  6  . Multiple Vitamin (MULTIVITAMIN WITH MINERALS) TABS Take 1 tablet by mouth daily.      . polyethylene glycol (MIRALAX) packet Take 17 g by mouth daily.  14 each  0  . potassium chloride SA (K-DUR,KLOR-CON) 20 MEQ tablet Take 1 tablet (20 mEq total) by mouth 2 (two) times daily.  60 tablet  3  . promethazine (PHENERGAN) 25 MG tablet Take 25 mg by mouth every 8 (eight) hours as needed. For  nausea      . traMADol (ULTRAM) 50 MG tablet Take 50 mg by mouth every 6 (six) hours as needed. FOR PAIN        Review of Systems Review of Systems  Blood pressure 107/75, pulse 78, temperature 96.7 F (35.9 C), temperature source Oral, height 5\' 3"  (1.6 m), weight 164 lb 14.4 oz (74.798 kg).  Physical Exam Physical ExamPt in NAD GU: EGBUS: no lesions Vagina: no blood in vault; no abnormal discharge Cervix: no lesion; no mucopurulent d/c; IUD strings noted +actowhite changes surrounding ectocervix.  No vascular changes noted     Data Reviewed PAP 10/12/12  ASCUS +HR HPV  Assessment    Procedure Details  The risks and benefits of the procedure and Written informed consent obtained.  Speculum placed in vagina and excellent visualization of cervix achieved, cervix swabbed x 3 with acetic acid solution.  Specimens: ECC and ectocervix  Complications: none.     Plan    Specimens labelled and sent to Pathology. Return to  discuss Pathology results in 2 weeks. Pt had questions re permanent sterilization d/w pt Mirena vs Essure      HARRAWAY-SMITH, Angela Payne 11/21/2012, 4:02 PM

## 2012-11-29 ENCOUNTER — Other Ambulatory Visit: Payer: Self-pay | Admitting: Family Medicine

## 2012-11-29 DIAGNOSIS — R928 Other abnormal and inconclusive findings on diagnostic imaging of breast: Secondary | ICD-10-CM

## 2012-12-01 ENCOUNTER — Telehealth: Payer: Self-pay | Admitting: *Deleted

## 2012-12-01 NOTE — Telephone Encounter (Signed)
Pt left a message requesting her results.

## 2012-12-02 NOTE — Telephone Encounter (Signed)
Called pt and informed her that her results have not been reviewed by Dr. Erin Fulling as of yet. I stated that pt has appt to discuss results on 12/22/12 @ 1330.  If upon review, there is anything that Dr. Erin Fulling needs her to know sooner, pt will receive a telephone call.  Pt voiced understanding and stated that she will keep appt on 12/22/12 as scheduled.

## 2012-12-05 ENCOUNTER — Encounter: Payer: Self-pay | Admitting: *Deleted

## 2012-12-06 ENCOUNTER — Other Ambulatory Visit: Payer: Medicaid Other

## 2012-12-07 ENCOUNTER — Ambulatory Visit
Admission: RE | Admit: 2012-12-07 | Discharge: 2012-12-07 | Disposition: A | Discharge status: Home / Self Care | Payer: Medicaid Other | Source: Ambulatory Visit | Attending: Family Medicine | Admitting: Family Medicine

## 2012-12-07 ENCOUNTER — Telehealth: Payer: Self-pay

## 2012-12-07 DIAGNOSIS — R928 Other abnormal and inconclusive findings on diagnostic imaging of breast: Secondary | ICD-10-CM

## 2012-12-07 NOTE — Telephone Encounter (Signed)
Pt called wanting test results.  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  I explained the LEEP procedure to the pt.  Pt stated understanding and had no further questions.   I informed pt that her original set appt for 12/23/11 @ 130pm for f/u is still same its just that she will come in for the LEEP.  Pt stated "ok, great" and did have any other questions.

## 2012-12-08 ENCOUNTER — Ambulatory Visit (HOSPITAL_COMMUNITY): Admission: RE | Admit: 2012-12-08 | Payer: Medicaid Other | Source: Ambulatory Visit

## 2012-12-22 ENCOUNTER — Ambulatory Visit (INDEPENDENT_AMBULATORY_CARE_PROVIDER_SITE_OTHER): Payer: Medicaid Other | Admitting: Obstetrics and Gynecology

## 2012-12-22 ENCOUNTER — Ambulatory Visit: Payer: Medicaid Other | Admitting: Obstetrics & Gynecology

## 2012-12-22 ENCOUNTER — Other Ambulatory Visit (HOSPITAL_COMMUNITY)
Admission: RE | Admit: 2012-12-22 | Discharge: 2012-12-22 | Disposition: A | Discharge status: Home / Self Care | Payer: Medicaid Other | Source: Ambulatory Visit | Attending: Obstetrics and Gynecology | Admitting: Obstetrics and Gynecology

## 2012-12-22 ENCOUNTER — Other Ambulatory Visit: Payer: Self-pay | Admitting: *Deleted

## 2012-12-22 ENCOUNTER — Encounter: Payer: Self-pay | Admitting: Obstetrics and Gynecology

## 2012-12-22 VITALS — BP 119/82 | HR 69 | Wt 162.8 lb

## 2012-12-22 DIAGNOSIS — R6889 Other general symptoms and signs: Secondary | ICD-10-CM

## 2012-12-22 DIAGNOSIS — N871 Moderate cervical dysplasia: Secondary | ICD-10-CM | POA: Insufficient documentation

## 2012-12-22 DIAGNOSIS — Z01812 Encounter for preprocedural laboratory examination: Secondary | ICD-10-CM

## 2012-12-22 DIAGNOSIS — IMO0001 Reserved for inherently not codable concepts without codable children: Secondary | ICD-10-CM | POA: Insufficient documentation

## 2012-12-22 DIAGNOSIS — R87619 Unspecified abnormal cytological findings in specimens from cervix uteri: Secondary | ICD-10-CM | POA: Insufficient documentation

## 2012-12-22 MED ORDER — CARVEDILOL 25 MG PO TABS: 12.5000 mg | ORAL_TABLET | Freq: Every day | ORAL | Status: DC

## 2012-12-22 NOTE — Progress Notes (Signed)
Patient ID: Angela Payne, female   DOB: 09-13-1961, 52 y.o.   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  Pap smear and colposcopy reviewed.   Teflon coated speculum with smoke evacuator placed.  Cervix visualized. IUD strings visualized at os. Paracervical block placed.  Medium size LOOP used to remove cone of cervix using blend of cut and cautery on LEEP machine.  Edges/Base cauterized with Ball.  Monsel's solution used for hemostasis.  Patient tolerated procedure well. IUD strings cut in the process of LEEP.  Patient given post procedure instructions.  Follow up in 6 months for repeat pap or as needed.

## 2012-12-22 NOTE — Patient Instructions (Addendum)
HPV Test The HPV (human papillomavirus) test is used to screen for high-risk types with HPV infection. HPV is a group of about 100 related viruses, of which 40 types are genital viruses. Most HPV viruses cause infections that usually resolve without treatment within 2 years. Some HPV infections can cause skin and genital warts (condylomata). HPV types 16, 18, 31 and 45 are considered high-risk types of HPV. High-risk types of HPV do not usually cause visible warts, but if untreated, may lead to cancers of the outlet of the womb (cervix) or anus. An HPV test identifies the DNA (genetic) strands of the HPV infection. Because the test identifies the DNA strands, the test is also referred to as the HPV DNA test. Although HPV is found in both males and females, the HPV test is only used to screen for cervical cancer in females. This test is recommended for females:  With an abnormal Pap test.  After treatment of an abnormal Pap test.  Aged 38 and older.  After treatment of a high-risk HPV infection. The HPV test may be done at the same time as a Pap test in females over the age of 44. Both the HPV and Pap test require a sample of cells from the cervix. PREPARATION FOR TEST  You may be asked to avoid douching, tampons, or vaginal medicines for 48 hours before the HPV test. You will be asked to urinate before the test. For the HPV test, you will need to lie on an exam table with your feet in stirrups. A spatula will be inserted into the vagina. The spatula will be used to swab the cervix for a cell and mucus sample. The sample will be further evaluated in a lab under a microscope. NORMAL FINDINGS  Normal: High-risk HPV is not found.  Ranges for normal findings may vary among different laboratories and hospitals. You should always check with your doctor after having lab work or other tests done to discuss the meaning of your test results and whether your values are considered within normal limits. MEANING  OF TEST An abnormal HPV test means that high-risk HPV is found. Your caregiver may recommend further testing. Your caregiver will go over the test results with you. He or she will and discuss the importance and meaning of your results, as well as treatment options and the need for additional tests, if necessary. OBTAINING THE RESULTS  It is your responsibility to obtain your test results. Ask the lab or department performing the test when and how you will get your results. Document Released: 12/11/2004 Document Revised: 02/08/2012 Document Reviewed: 08/26/2005 [facility] Patient Information 2013 [place], [place].  Loop Electrosurgical Excision Procedure Care After Refer to this sheet in the next few weeks. These instructions provide you with information on caring for yourself after your procedure. Your caregiver may also give you more specific instructions. Your treatment has been planned according to current medical practices, but problems sometimes occur. Call your caregiver if you have any problems or questions after your procedure. HOME CARE INSTRUCTIONS   Do not use tampons, douche, or have sexual intercourse for 2 weeks or as directed by your caregiver.  Begin normal activities if you have no or minimal cramping or bleeding, unless directed otherwise by your caregiver.  Take your temperature if you feel sick. Write down your temperature on paper, and tell your caregiver if you have a fever.  Take all medicines as directed by your caregiver.  Keep all your follow-up appointments and Pap tests  as directed by your caregiver. SEEK IMMEDIATE MEDICAL CARE IF:   You have bleeding that is heavier or longer than a normal menstrual cycle.  You have bleeding that is bright red.  You have blood clots.  You have a fever.  You have increasing cramps or pain not relieved by medicine.  You develop abdominal pain that does not seem to be related to the same area of earlier cramping and  pain.  You are lightheaded, unusually weak, or faint.  You develop painful or bloody urination.  You develop a bad smelling vaginal discharge. MAKE SURE YOU:  Understand these instructions.  Will watch your condition.  Will get help right away if you are not doing well or get worse. Document Released: 07/30/2011 Document Revised: 02/08/2012 Document Reviewed: 07/30/2011 [facility] Patient Information 2013 [place], [place].

## 2012-12-23 ENCOUNTER — Telehealth: Payer: Self-pay | Admitting: *Deleted

## 2012-12-23 ENCOUNTER — Telehealth: Payer: Self-pay | Admitting: Cardiovascular Disease

## 2012-12-23 NOTE — Telephone Encounter (Signed)
Refilled already

## 2012-12-23 NOTE — Telephone Encounter (Signed)
Patient left a message inquiring if her back pain could be related to her leep procedure. I advised her that most likely it is not related and if it continues to bother her she should see her PCP. Pt agrees with plan.

## 2012-12-23 NOTE — Telephone Encounter (Signed)
Pt requesting refill of carvedilol at [facility] [place]

## 2012-12-26 ENCOUNTER — Encounter (HOSPITAL_COMMUNITY): Payer: Self-pay

## 2012-12-26 ENCOUNTER — Ambulatory Visit (HOSPITAL_COMMUNITY)
Admission: RE | Admit: 2012-12-26 | Discharge: 2012-12-26 | Disposition: A | Discharge status: Home / Self Care | Payer: Medicaid Other | Source: Ambulatory Visit | Attending: Internal Medicine | Admitting: Internal Medicine

## 2012-12-26 VITALS — BP 108/70 | HR 72 | Wt 163.1 lb

## 2012-12-26 DIAGNOSIS — I5022 Chronic systolic (congestive) heart failure: Secondary | ICD-10-CM

## 2012-12-26 NOTE — Progress Notes (Addendum)
EP: Dr. Graciela Husbands  HPI: Angela Payne is a 52 y.o. AAF with prior history that includes HTN, an out of hospital V fib arrest in August 2011.  She was cooled on [facility] protocol and underwent cardiac cath showing no evidence of obstructive CAD.  Echo at the time showed EF 15%.  Cardiac MRI showed EF 29% with severe LVE with global hypokinesis.  Normal RA/RV.  No scar.  She underwent St Jude CRT-D placement by Dr. Graciela Husbands prior to discharge.   She also has history of PAF and HTN.  She had history of cocaine abuse in remote past (last use 2008).  She denies tobacco or alcohol abuse over the last year and a half.  She use to work at [employer] but currently unemployed.  Last echo in 2011.  She has been referred by Dr. Graciela Husbands for follow up in the AHF clinic.  She says that she is tired most of the time and has been this way since her arrest.  She has to take her time in everything that she does.  Walking around the store she has to take multiple rest breaks.  She also complains of intermittent nausea.  No edema, orthopnea or PND.       ROS: All systems negative except as listed in HPI, PMH and Problem List.  Past Medical History  Diagnosis Date  . Other left bundle branch block   . Automatic implantable cardiac defibrillator in situ     a. 06/2010 s/p  s/p SJM 3231-40 Bi V ICD Ser # [id number].  Marland Kitchen Chronic systolic heart failure   . Anemia, unspecified   . Paroxysmal supraventricular tachycardia   . Nonischemic cardiomyopathy     a. 06/2010 OOH VF arrest;  b. 06/2010 Cath: nonobs dzs, EF 15-20%, glob HK;  c. 06/2010 Echo: EF 15%, diff HK, Gr 2 DD;  d. 06/2010 Cardiac MRI EF of 29%, no scar;  e. 06/2010 s/p SJM 3231-40 Bi V ICD Ser # [id number].  Marland Kitchen Atrial fibrillation     a. This occurred in setting of resuscitation efforts during VF arrest.  . Cardiac arrest     a. 06/2010  . History of tobacco abuse   . History of cocaine abuse     remote  . Thyroid disease   . SUBSTANCE ABUSE, MULTIPLE 08/13/2010    Qualifier:  Diagnosis of  By: Denyse Amass CMA, Carol     History   Social History  . Marital Status: Divorced    Spouse Name: N/A    Number of Children: 4  . Years of Education: N/A   Occupational History  . Disablity    Social History Main Topics  . Smoking status: Former Smoker    Quit date: 07/28/2011  . Smokeless tobacco: Never Used     Comment: quit 2 weeks ago  . Alcohol Use: No  . Drug Use: No     Comment: history of cocaine abuse last used 2008  . Sexually Active: Yes    Birth Control/ Protection: IUD   Other Topics Concern  . None   Social History Narrative  . None   Family History  Problem Relation Age of Onset  . Heart disease Father      Current Outpatient Prescriptions  Medication Sig Dispense Refill  . albuterol (PROVENTIL HFA;VENTOLIN HFA) 108 (90 BASE) MCG/ACT inhaler Inhale 2 puffs into the lungs every 4 (four) hours as needed. For shortness of breath or wheezing      . aspirin 81  MG chewable tablet Chew 81 mg by mouth daily.      . carvedilol (COREG) 25 MG tablet Take 0.5 tablets (12.5 mg total) by mouth daily.  30 tablet  3  . furosemide (LASIX) 40 MG tablet Take 40 mg by mouth.      Marland Kitchen HYDROcodone-acetaminophen (NORCO/VICODIN) 5-325 MG per tablet Take 2 tablets by mouth every 4 (four) hours as needed for pain.  10 tablet  0  . isosorbide-hydrALAZINE (BIDIL) 20-37.5 MG per tablet Take 1 tablet by mouth 2 (two) times daily.  180 tablet  3  . lisinopril (PRINIVIL,ZESTRIL) 5 MG tablet Take 1 tablet (5 mg total) by mouth daily.  30 tablet  6  . Multiple Vitamin (MULTIVITAMIN WITH MINERALS) TABS Take 1 tablet by mouth daily.      . polyethylene glycol (MIRALAX) packet Take 17 g by mouth daily.  14 each  0  . potassium chloride SA (K-DUR,KLOR-CON) 20 MEQ tablet Take 1 tablet (20 mEq total) by mouth 2 (two) times daily.  60 tablet  3  . promethazine (PHENERGAN) 25 MG tablet Take 25 mg by mouth every 8 (eight) hours as needed. For nausea      . traMADol (ULTRAM) 50 MG tablet  Take 50 mg by mouth every 6 (six) hours as needed. FOR PAIN         PHYSICAL EXAM: Filed Vitals:   12/26/12 1031  BP: 108/70  Pulse: 72  Weight: 163 lb 1.9 oz (73.991 kg)  SpO2: 99%    General:  Well appearing. No resp difficulty HEENT: normal Neck: supple. JVP flat. Carotids 2+ bilaterally; no bruits. No lymphadenopathy or thryomegaly appreciated. Cor: PMI normal. Regular rate & rhythm. No rubs, gallops or murmurs. No S3.  Lungs: clear Abdomen: soft, nontender, nondistended. No hepatosplenomegaly. No bruits or masses. Good bowel sounds. Extremities: no cyanosis, clubbing, rash, edema Neuro: alert & orientedx3, cranial nerves grossly intact. Moves all 4 extremities w/o difficulty. Affect pleasant.      ASSESSMENT & PLAN:

## 2012-12-26 NOTE — Assessment & Plan Note (Addendum)
Volume status stable.  NYHA III-IIIb.  She is on good medication and tolerating well at this time.  Will plan to repeat echocardiogram as last echo in system was in 2011.  Will also proceed with CPX testing to assess functional class and possible need for advance therapies.  Continue coreg, lasix, bidil and lisinopril.  Can consider adding digoxin at follow up after review of echo and CPX.  She cancelled her sleep study and have encouraged her to reschedule.    Patient seen and examined with Ulyess Blossom, PA-C. We discussed all aspects of the encounter. I agree with the assessment and plan as stated above.  She reports NYHA IIIB symptoms. However no volume overload or S3 on exam. We will repeat echo and CPX to guide further management. If EF remains depressed will need RHC. She is on a good medicine regimen - will continue.

## 2012-12-26 NOTE — Patient Instructions (Addendum)
Follow up 2-3 weeks.  Your physician has requested that you have an echocardiogram. Echocardiography is a painless test that uses sound waves to create images of your heart. It provides your doctor with information about the size and shape of your heart and how well your heart's chambers and valves are working. This procedure takes approximately one hour. There are no restrictions for this procedure.  Your physician has recommended that you have a cardiopulmonary stress test (CPX). CPX testing is a non-invasive measurement of heart and lung function. It replaces a traditional treadmill stress test. This type of test provides a tremendous amount of information that relates not only to your present condition but also for future outcomes. This test combines measurements of you ventilation, respiratory gas exchange in the lungs, electrocardiogram (EKG), blood pressure and physical response before, during, and following an exercise protocol.  Schedule sleep study.

## 2012-12-28 ENCOUNTER — Telehealth: Payer: Self-pay | Admitting: *Deleted

## 2012-12-28 ENCOUNTER — Encounter: Payer: Self-pay | Admitting: *Deleted

## 2012-12-28 NOTE — Telephone Encounter (Signed)
Angela Payne called and left another message requesting a call about questions she has about loop.

## 2012-12-28 NOTE — Telephone Encounter (Signed)
Angela Payne called and left a message stating she had the loop done a few days ago and is having some discharge. Requests a call back.

## 2012-12-29 NOTE — Telephone Encounter (Signed)
Called patient back and told her I received her message. Patient stated she was having a light brown d/c with spotting and didn't know if this was normal. Told patient it is normal to experience that d/c for the first two weeks because of the procedure. Patient verbalized understanding. Patient asked about a follow up appt. Told patient the dr did not note that one was needed except for the 6 month pap smear. Told patient to please call us back or come in should she experience heavier bleeding or concerns about an infection. Patient verbalized understanding and had no further questions

## 2013-01-02 ENCOUNTER — Encounter (HOSPITAL_BASED_OUTPATIENT_CLINIC_OR_DEPARTMENT_OTHER): Payer: Medicaid Other

## 2013-01-10 ENCOUNTER — Ambulatory Visit (HOSPITAL_COMMUNITY): Payer: Medicaid Other

## 2013-01-10 ENCOUNTER — Encounter (HOSPITAL_COMMUNITY): Payer: Medicaid Other

## 2013-01-19 ENCOUNTER — Encounter (HOSPITAL_COMMUNITY): Payer: Medicaid Other

## 2013-02-02 ENCOUNTER — Telehealth: Payer: Self-pay | Admitting: *Deleted

## 2013-02-02 NOTE — Telephone Encounter (Addendum)
Pt left message stating that she had appt for LEEP f/u on 3/10. She is not having any problems from the procedure and has changed her appt to 03/17/13 so that she can be seen by Dr. Erin Fulling. She wants to discuss having her IUD removed and to get BTL w/Dr. Erin Fulling. She just wanted to be sure it is ok to wait to be seen until 4/18. I called pt and informed her that she will not need to be seen prior to 4/18 since she is not having any problems and her LEEP procedure was 6 weeks ago.  Pt voiced understanding.

## 2013-02-06 ENCOUNTER — Ambulatory Visit: Payer: Medicaid Other | Admitting: Medical

## 2013-02-14 ENCOUNTER — Ambulatory Visit (INDEPENDENT_AMBULATORY_CARE_PROVIDER_SITE_OTHER): Payer: Medicaid Other | Admitting: Cardiology

## 2013-02-14 ENCOUNTER — Encounter: Payer: Self-pay | Admitting: Internal Medicine

## 2013-02-14 VITALS — HR 85 | Ht 63.0 in | Wt 163.0 lb

## 2013-02-14 DIAGNOSIS — I428 Other cardiomyopathies: Secondary | ICD-10-CM

## 2013-02-14 DIAGNOSIS — I5022 Chronic systolic (congestive) heart failure: Secondary | ICD-10-CM

## 2013-02-14 LAB — ICD DEVICE OBSERVATION
AL IMPEDENCE ICD: 400 Ohm
AL THRESHOLD: 0.5 V
ATRIAL PACING ICD: 1.2 pct
BAMS-0001: 170 {beats}/min
BAMS-0003: 70 {beats}/min
DEVICE MODEL ICD: 621486
FVT: 0
HV IMPEDENCE: 46 Ohm
RV LEAD THRESHOLD: 0.5 V
TZAT-0001SLOWVT: 1
TZAT-0004SLOWVT: 8
TZAT-0012SLOWVT: 200 ms
TZAT-0013SLOWVT: 3
TZON-0004SLOWVT: 35
TZST-0001SLOWVT: 3
TZST-0001SLOWVT: 5
TZST-0003SLOWVT: 650 V
TZST-0003SLOWVT: 845 V
VENTRICULAR PACING ICD: 99 pct
VF: 0

## 2013-02-14 NOTE — Progress Notes (Signed)
BiV ICD check/device clinic visit. Last seen by Dr. Graciela Husbands December 2013. Patient doing well without complaints. Normal BiV ICD function. See PaceArt report.

## 2013-02-14 NOTE — Patient Instructions (Addendum)
Your physician recommends that you schedule a follow-up appointment in: 05/25/13  Your physician wants you to follow-up in: Dr Graciela Husbands in Dec You will receive a reminder letter in the mail two months in advance. If you don't receive a letter, please call our office to schedule the follow-up appointment.

## 2013-02-24 ENCOUNTER — Encounter (HOSPITAL_BASED_OUTPATIENT_CLINIC_OR_DEPARTMENT_OTHER): Payer: Self-pay

## 2013-03-09 ENCOUNTER — Telehealth: Payer: Self-pay | Admitting: *Deleted

## 2013-03-09 NOTE — Telephone Encounter (Signed)
Angela Payne called and left  A message she came here and had a mammogram and was sent to the breast center. States they told her to come back in 6 months but if she felt anything was different to make an appointment. States she called to make an appointment and they said she had to have a referral from the clinic to be able to get an appointment.

## 2013-03-10 ENCOUNTER — Other Ambulatory Visit: Payer: Self-pay | Admitting: Family Medicine

## 2013-03-10 ENCOUNTER — Telehealth: Payer: Self-pay | Admitting: Cardiovascular Disease

## 2013-03-10 DIAGNOSIS — N6310 Unspecified lump in the right breast, unspecified quadrant: Secondary | ICD-10-CM

## 2013-03-10 NOTE — Telephone Encounter (Signed)
New problem   Pt would like to speak to a nurse about having an [place] of her heart. Please call pt

## 2013-03-10 NOTE — Telephone Encounter (Signed)
Pt was seen in Jan and scheduled to have and echo and CPX but cancelled both test, Chantel can you please schedule both test and f/u appt with Dr Gala Romney, thanks

## 2013-03-13 NOTE — Telephone Encounter (Signed)
Called pt and pt informed me that she did not mean to call the clinics but she did get referral taken care of.  Pt did not have any other questions.

## 2013-03-17 ENCOUNTER — Ambulatory Visit: Payer: Medicaid Other | Admitting: Obstetrics & Gynecology

## 2013-03-23 ENCOUNTER — Other Ambulatory Visit: Payer: Medicaid Other

## 2013-03-28 ENCOUNTER — Other Ambulatory Visit: Payer: Self-pay

## 2013-03-28 MED ORDER — FUROSEMIDE 40 MG PO TABS: 40.0000 mg | ORAL_TABLET | Freq: Every day | ORAL | Status: DC

## 2013-03-29 ENCOUNTER — Ambulatory Visit: Payer: Medicaid Other | Admitting: Obstetrics and Gynecology

## 2013-03-30 ENCOUNTER — Other Ambulatory Visit: Payer: Medicaid Other

## 2013-03-31 ENCOUNTER — Ambulatory Visit (INDEPENDENT_AMBULATORY_CARE_PROVIDER_SITE_OTHER): Payer: Medicaid Other | Admitting: Obstetrics & Gynecology

## 2013-03-31 ENCOUNTER — Encounter: Payer: Self-pay | Admitting: Obstetrics & Gynecology

## 2013-03-31 VITALS — BP 99/70 | HR 86 | Temp 97.1°F | Ht 63.0 in | Wt 164.3 lb

## 2013-03-31 DIAGNOSIS — N898 Other specified noninflammatory disorders of vagina: Secondary | ICD-10-CM

## 2013-03-31 DIAGNOSIS — N907 Vulvar cyst: Secondary | ICD-10-CM

## 2013-03-31 DIAGNOSIS — N951 Menopausal and female climacteric states: Secondary | ICD-10-CM

## 2013-03-31 DIAGNOSIS — N9089 Other specified noninflammatory disorders of vulva and perineum: Secondary | ICD-10-CM

## 2013-03-31 NOTE — Patient Instructions (Addendum)
Menopause Menopause is the normal time of life when menstrual periods stop completely. Menopause is complete when you have missed 12 consecutive menstrual periods. It usually occurs between the ages of 48 to 55, with an average age of 51. Very rarely does a woman develop menopause before 51 years old. At menopause, your ovaries stop producing the female hormones, estrogen and progesterone. This can cause undesirable symptoms and also affect your health. Sometimes the symptoms may occur 4 to 5 years before the menopause begins. There is no relationship between menopause and:  Oral contraceptives.  Number of children you had.  Race.  The age your menstrual periods started (menarche). Heavy smokers and very thin women may develop menopause earlier in life. CAUSES  The ovaries stop producing the female hormones estrogen and progesterone.  Other causes include:  Surgery to remove both ovaries.  The ovaries stop functioning for no known reason.  Tumors of the pituitary gland in the brain.  Medical disease that affects the ovaries and hormone production.  Radiation treatment to the abdomen or pelvis.  Chemotherapy that affects the ovaries. SYMPTOMS   Hot flashes.  Night sweats.  Decrease in sex drive.  Vaginal dryness and thinning of the vagina causing painful intercourse.  Dryness of the skin and developing wrinkles.  Headaches.  Tiredness.  Irritability.  Memory problems.  Weight gain.  Bladder infections.  Hair growth of the face and chest.  Infertility. More serious symptoms include:  Loss of bone (osteoporosis) causing breaks (fractures).  Depression.  Hardening and narrowing of the arteries (atherosclerosis) causing heart attacks and strokes. DIAGNOSIS   When the menstrual periods have stopped for 12 straight months.  Physical exam.  Hormone studies of the blood. TREATMENT  There are many treatment choices and nearly as many questions about them.  The decisions to treat or not to treat menopausal changes is an individual choice made with your caregiver. Your caregiver can discuss the treatments with you. Together, you can decide which treatment will work best for you. Your treatment choices may include:   Hormone therapy (estorgen and progesterone).  Non-hormonal medications.  Treating the individual symptoms with medication (for example antidepressants for depression).  Herbal medications that may help specific symptoms.  Counseling by a psychiatrist or psychologist.  Group therapy.  Lifestyle changes including:  Eating healthy.  Regular exercise.  Limiting caffeine and alcohol.  Stress management and meditation.  No treatment. HOME CARE INSTRUCTIONS   Take the medication your caregiver gives you as directed.  Get plenty of sleep and rest.  Exercise regularly.  Eat a diet that contains calcium (good for the bones) and soy products (acts like estrogen hormone).  Avoid alcoholic beverages.  Do not smoke.  If you have hot flashes, dress in layers.  Take supplements, calcium and vitamin D to strengthen bones.  You can use over-the-counter lubricants or moisturizers for vaginal dryness.  Group therapy is sometimes very helpful.  Acupuncture may be helpful in some cases. SEEK MEDICAL CARE IF:   You are not sure you are in menopause.  You are having menopausal symptoms and need advice and treatment.  You are still having menstrual periods after age 55.  You have pain with intercourse.  Menopause is complete (no menstrual period for 12 months) and you develop vaginal bleeding.  You need a referral to a specialist (gynecologist, psychiatrist or psychologist) for treatment. SEEK IMMEDIATE MEDICAL CARE IF:   You have severe depression.  You have excessive vaginal bleeding.  You fell and   think you have a broken bone.  You have pain when you urinate.  You develop leg or chest pain.  You have a fast  pounding heart beat (palpitations).  You have severe headaches.  You develop vision problems.  You feel a lump in your breast.  You have abdominal pain or severe indigestion. Document Released: 02/06/2004 Document Revised: 02/08/2012 Document Reviewed: 09/13/2008 [facility] Patient Information 2013 [place], [place]. Perimenopause Perimenopause is the time when your body begins to move into the menopause (no menstrual period for 12 straight months). It is a natural process. Perimenopause can begin 2 to 8 years before the menopause and usually lasts for one year after the menopause. During this time, your ovaries may or may not produce an egg. The ovaries vary in their production of estrogen and progesterone hormones each month. This can cause irregular menstrual periods, difficulty in getting pregnant, vaginal bleeding between periods and uncomfortable symptoms. CAUSES  Irregular production of the ovarian hormones, estrogen and progesterone, and not ovulating every month.  Other causes include:  Tumor of the pituitary gland in the brain.  Medical disease that affects the ovaries.  Radiation treatment.  Chemotherapy.  Unknown causes.  Heavy smoking and excessive alcohol intake can bring on perimenopause sooner. SYMPTOMS   Hot flashes.  Night sweats.  Irregular menstrual periods.  Decrease sex drive.  Vaginal dryness.  Headaches.  Mood swings.  Depression.  Memory problems.  Irritability.  Tiredness.  Weight gain.  Trouble getting pregnant.  The beginning of losing bone cells (osteoporosis).  The beginning of hardening of the arteries (atherosclerosis). DIAGNOSIS  Your caregiver will make a diagnosis by analyzing your age, menstrual history and your symptoms. They will do a physical exam noting any changes in your body, especially your female organs. Female hormone tests may or may not be helpful depending on the amount and when you produce the female hormones.  However, other hormone tests may be helpful (ex. thyroid hormone) to rule out other problems. TREATMENT  The decision to treat during the perimenopause should be made by you and your caregiver depending on how the symptoms are affecting you and your life style. There are various treatments available such as:  Treating individual symptoms with a specific medication for that symptom (ex. tranquilizer for depression).  Herbal medications that can help specific symptoms.  Counseling.  Group therapy.  No treatment. HOME CARE INSTRUCTIONS   Before seeing your caregiver, make a list of your menstrual periods (when the occur, how heavy they are, how long between periods and how long they last), your symptoms and when they started.  Take the medication as recommended by your caregiver.  Sleep and rest.  Exercise.  Eat a diet that contains calcium (good for your bones) and soy (acts like estrogen hormone).  Do not smoke.  Avoid alcoholic beverages.  Taking vitamin E may help in certain cases.  Take calcium and vitamin D supplements to help prevent bone loss.  Group therapy is sometimes helpful.  Acupuncture may help in some cases. SEEK MEDICAL CARE IF:   You have any of the above and want to know if it is perimenopause.  You want advice and treatment for any of your symptoms mentioned above.  You need a referral to a specialist (gynecologist, psychiatrist or psychologist). SEEK IMMEDIATE MEDICAL CARE IF:   You have vaginal bleeding.  Your period lasts longer than 8 days.  You periods are recurring sooner than 21 days.  You have bleeding after intercourse.  You  have severe depression.  You have pain when you urinate.  You have severe headaches.  You develop vision problems. Document Released: 12/24/2004 Document Revised: 02/08/2012 Document Reviewed: 09/13/2008 [facility] Patient Information 2013 [place], [place]. HPV Test The HPV (human papillomavirus) test is used  to screen for high-risk types with HPV infection. HPV is a group of about 100 related viruses, of which 40 types are genital viruses. Most HPV viruses cause infections that usually resolve without treatment within 2 years. Some HPV infections can cause skin and genital warts (condylomata). HPV types 16, 18, 31 and 45 are considered high-risk types of HPV. High-risk types of HPV do not usually cause visible warts, but if untreated, may lead to cancers of the outlet of the womb (cervix) or anus. An HPV test identifies the DNA (genetic) strands of the HPV infection. Because the test identifies the DNA strands, the test is also referred to as the HPV DNA test. Although HPV is found in both males and females, the HPV test is only used to screen for cervical cancer in females. This test is recommended for females:  With an abnormal Pap test.  After treatment of an abnormal Pap test.  Aged 70 and older.  After treatment of a high-risk HPV infection. The HPV test may be done at the same time as a Pap test in females over the age of 41. Both the HPV and Pap test require a sample of cells from the cervix. PREPARATION FOR TEST  You may be asked to avoid douching, tampons, or vaginal medicines for 48 hours before the HPV test. You will be asked to urinate before the test. For the HPV test, you will need to lie on an exam table with your feet in stirrups. A spatula will be inserted into the vagina. The spatula will be used to swab the cervix for a cell and mucus sample. The sample will be further evaluated in a lab under a microscope. NORMAL FINDINGS  Normal: High-risk HPV is not found.  Ranges for normal findings may vary among different laboratories and hospitals. You should always check with your doctor after having lab work or other tests done to discuss the meaning of your test results and whether your values are considered within normal limits. MEANING OF TEST An abnormal HPV test means that high-risk HPV  is found. Your caregiver may recommend further testing. Your caregiver will go over the test results with you. He or she will and discuss the importance and meaning of your results, as well as treatment options and the need for additional tests, if necessary. OBTAINING THE RESULTS  It is your responsibility to obtain your test results. Ask the lab or department performing the test when and how you will get your results. Document Released: 12/11/2004 Document Revised: 02/08/2012 Document Reviewed: 08/26/2005 [facility] Patient Information 2013 [place], [place].

## 2013-03-31 NOTE — Progress Notes (Signed)
Subjective:     Patient ID: Angela Payne, female   DOB: 06-23-1961, 52 y.o.   MRN: [id number]  HPI Pt presents with request to have IUD removed and BTL scheduled.  Pt is worried about having problems being placed under general anesthesia in the future for any emergency due to her health issues.  She is having NO problems with her Mirena.  She has no irregular bleeding- or bleeding at all with her Mirena.   Pt denies hot flashes or other sx.  Pt c/o increased discharge recently with no odor.  No itching or pelvic pain.  Past Medical History  Diagnosis Date  . Other left bundle branch block   . Automatic implantable cardiac defibrillator in situ     a. 06/2010 s/p  s/p SJM 3231-40 Bi V ICD Ser # [id number].  Marland Kitchen Chronic systolic heart failure   . Anemia, unspecified   . Paroxysmal supraventricular tachycardia   . Nonischemic cardiomyopathy     a. 06/2010 OOH VF arrest;  b. 06/2010 Cath: nonobs dzs, EF 15-20%, glob HK;  c. 06/2010 Echo: EF 15%, diff HK, Gr 2 DD;  d. 06/2010 Cardiac MRI EF of 29%, no scar;  e. 06/2010 s/p SJM 3231-40 Bi V ICD Ser # [id number].  Marland Kitchen Atrial fibrillation     a. This occurred in setting of resuscitation efforts during VF arrest.  . Cardiac arrest     a. 06/2010  . History of tobacco abuse   . History of cocaine abuse     remote  . Thyroid disease   . SUBSTANCE ABUSE, MULTIPLE 08/13/2010    Qualifier: Diagnosis of  By: Wendee Copp     Past Surgical History  Procedure Laterality Date  . Cardiac defibrillator placement      [facility] CD [id number] Q defib, serial number [id number]   Current Outpatient Prescriptions on File Prior to Visit  Medication Sig Dispense Refill  . albuterol (PROVENTIL HFA;VENTOLIN HFA) 108 (90 BASE) MCG/ACT inhaler Inhale 2 puffs into the lungs every 4 (four) hours as needed. For shortness of breath or wheezing      . aspirin 81 MG chewable tablet Chew 81 mg by mouth daily.      . carvedilol (COREG) 25 MG tablet Take 0.5 tablets (12.5 mg total) by  mouth daily.  30 tablet  3  . furosemide (LASIX) 40 MG tablet Take 1 tablet (40 mg total) by mouth daily.  30 tablet  3  . HYDROcodone-acetaminophen (NORCO/VICODIN) 5-325 MG per tablet Take 2 tablets by mouth every 4 (four) hours as needed for pain.  10 tablet  0  . isosorbide-hydrALAZINE (BIDIL) 20-37.5 MG per tablet Take 1 tablet by mouth 2 (two) times daily.  180 tablet  3  . lisinopril (PRINIVIL,ZESTRIL) 5 MG tablet Take 1 tablet (5 mg total) by mouth daily.  30 tablet  6  . Multiple Vitamin (MULTIVITAMIN WITH MINERALS) TABS Take 1 tablet by mouth daily.      . potassium chloride SA (K-DUR,KLOR-CON) 20 MEQ tablet Take 1 tablet (20 mEq total) by mouth 2 (two) times daily.  60 tablet  3  . promethazine (PHENERGAN) 25 MG tablet Take 25 mg by mouth every 8 (eight) hours as needed. For nausea      . traMADol (ULTRAM) 50 MG tablet Take 50 mg by mouth every 6 (six) hours as needed. FOR PAIN      . polyethylene glycol (MIRALAX / GLYCOLAX) packet Take 17 g by mouth as needed.  No current facility-administered medications on file prior to visit.   No Known Allergies     Review of Systems     Objective:   Physical Exam BP 99/70  Pulse 86  Temp(Src) 97.1 F (36.2 C) (Oral)  Ht 5\' 3"  (1.6 m)  Wt 164 lb 4.8 oz (74.526 kg)  BMI 29.11 kg/m2  Pt in NAD GU: EGBUS: on the mons pubis these is a small comedone or inclusion cyst.  No erythema not firm, not excoriated  Vagina: no blood in vault; no abnormal discharge Cervix: no lesion; no mucopurulent d/c         Assessment:     Contraception counseling- Pt educated about the longevity of the Mirena and that she can keep it until Menopause.  We discussed in detail the risks of surgery and the difference between indicated and elective surgery in terms of risk of anesthesia.  Pt will keep the Mirena until menopause Leukorrhea- normal exam Vulvar cyst- expectant management       Plan:     Lab: [facility] F/u 6 months or sooner prn   Wants  written info on HPV

## 2013-03-31 NOTE — Progress Notes (Signed)
Patient says IUD strings were visible until her LEEP procedure on 12/22/12.  She wants her tubes tied and was told she could have the IUD removed and her tubes tied in same visit.  She is c/o a white discharge with no itching, burning, or odor.  She reports a painful lump on her genitals.

## 2013-04-11 ENCOUNTER — Ambulatory Visit
Admission: RE | Admit: 2013-04-11 | Discharge: 2013-04-11 | Disposition: A | Discharge status: Home / Self Care | Payer: Medicaid Other | Source: Ambulatory Visit | Attending: Family Medicine | Admitting: Family Medicine

## 2013-04-11 DIAGNOSIS — N6310 Unspecified lump in the right breast, unspecified quadrant: Secondary | ICD-10-CM

## 2013-04-21 ENCOUNTER — Ambulatory Visit (HOSPITAL_BASED_OUTPATIENT_CLINIC_OR_DEPARTMENT_OTHER)
Admission: RE | Admit: 2013-04-21 | Discharge: 2013-04-21 | Disposition: A | Discharge status: Home / Self Care | Payer: Medicaid Other | Source: Ambulatory Visit | Attending: Internal Medicine | Admitting: Internal Medicine

## 2013-04-21 ENCOUNTER — Ambulatory Visit (HOSPITAL_COMMUNITY)
Admission: RE | Admit: 2013-04-21 | Discharge: 2013-04-21 | Disposition: A | Discharge status: Home / Self Care | Payer: Medicaid Other | Source: Ambulatory Visit | Attending: Internal Medicine | Admitting: Internal Medicine

## 2013-04-21 ENCOUNTER — Encounter (HOSPITAL_COMMUNITY): Payer: Self-pay

## 2013-04-21 VITALS — BP 110/70 | HR 60 | Wt 164.8 lb

## 2013-04-21 DIAGNOSIS — I509 Heart failure, unspecified: Secondary | ICD-10-CM | POA: Insufficient documentation

## 2013-04-21 DIAGNOSIS — I5022 Chronic systolic (congestive) heart failure: Secondary | ICD-10-CM

## 2013-04-21 DIAGNOSIS — I079 Rheumatic tricuspid valve disease, unspecified: Secondary | ICD-10-CM | POA: Insufficient documentation

## 2013-04-21 DIAGNOSIS — I517 Cardiomegaly: Secondary | ICD-10-CM

## 2013-04-21 NOTE — Assessment & Plan Note (Addendum)
Have reviewed echo from today and EF has recovered!  Will use lasix as needed.  Continue carvedilol, bidil and lisinopril. Will follow up in the HF clinic only as needed.    Patient seen and examined with Ulyess Blossom, PA-C. We discussed all aspects of the encounter. I agree with the assessment and plan as stated above.  Echo reviewed personally. EF ~50%. (Dr. Jens Som read officially as 40-45%). Doing well. Continue current regimen. Use lasix PRN.

## 2013-04-21 NOTE — Patient Instructions (Signed)
Take lasix as needed.    Follow up US as needed

## 2013-04-21 NOTE — Progress Notes (Signed)
EP: Dr. Graciela Husbands  HPI: Angela Payne is a 52 y.o. AAF with prior history that includes HTN, an out of hospital V fib arrest in August 2011.  She was cooled on [employer] protocol and underwent cardiac cath showing no evidence of obstructive CAD.  Echo at the time showed EF 15%.  Cardiac MRI showed EF 29% with severe LVE with global hypokinesis.  Normal RA/RV.  No scar.  She underwent [employer] CRT-D placement by Dr. Graciela Husbands prior to discharge.   She also has history of PAF and HTN.  She had history of cocaine abuse in remote past (last use 2008).  She denies tobacco or alcohol abuse over the last year and a half.  She use to work at [employer] but currently unemployed.  Last echo in 2011, EF 15%.  She returns for follow up today.  She feels she is doing better.  She still gets tired a lot.  She is trying to walk a couple of blocks.  No orthopnea, chronic 2 pillow sleeper.  No PND.  No edema. Weighing at home.    Echo today 50-55% (reviewed in clinic)  ROS: All systems negative except as listed in HPI, PMH and Problem List.  Past Medical History  Diagnosis Date  . Other left bundle branch block   . Automatic implantable cardiac defibrillator in situ     a. 06/2010 s/p  s/p SJM 3231-40 Bi V ICD Ser # [id number].  Marland Kitchen Chronic systolic heart failure   . Anemia, unspecified   . Paroxysmal supraventricular tachycardia   . Nonischemic cardiomyopathy     a. 06/2010 OOH VF arrest;  b. 06/2010 Cath: nonobs dzs, EF 15-20%, glob HK;  c. 06/2010 Echo: EF 15%, diff HK, Gr 2 DD;  d. 06/2010 Cardiac MRI EF of 29%, no scar;  e. 06/2010 s/p SJM 3231-40 Bi V ICD Ser # [id number].  Marland Kitchen Atrial fibrillation     a. This occurred in setting of resuscitation efforts during VF arrest.  . Cardiac arrest     a. 06/2010  . History of tobacco abuse   . History of cocaine abuse     remote  . Thyroid disease   . SUBSTANCE ABUSE, MULTIPLE 08/13/2010    Qualifier: Diagnosis of  By: Denyse Amass CMA, Carol     History   Social History  . Marital  Status: Married    Spouse Name: N/A    Number of Children: 4  . Years of Education: N/A   Occupational History  . Disablity    Social History Main Topics  . Smoking status: Former Smoker    Quit date: 07/28/2011  . Smokeless tobacco: Never Used     Comment: quit 2 weeks ago  . Alcohol Use: No  . Drug Use: No     Comment: history of cocaine abuse last used 2008  . Sexually Active: Yes    Birth Control/ Protection: IUD   Other Topics Concern  . None   Social History Narrative  . None   Family History  Problem Relation Age of Onset  . Heart disease Father      Current Outpatient Prescriptions  Medication Sig Dispense Refill  . albuterol (PROVENTIL HFA;VENTOLIN HFA) 108 (90 BASE) MCG/ACT inhaler Inhale 2 puffs into the lungs every 4 (four) hours as needed. For shortness of breath or wheezing      . aspirin 81 MG chewable tablet Chew 81 mg by mouth daily.      . carvedilol (COREG) 25  MG tablet Take 0.5 tablets (12.5 mg total) by mouth daily.  30 tablet  3  . furosemide (LASIX) 40 MG tablet Take 1 tablet (40 mg total) by mouth daily.  30 tablet  3  . HYDROcodone-acetaminophen (NORCO/VICODIN) 5-325 MG per tablet Take 2 tablets by mouth every 4 (four) hours as needed for pain.  10 tablet  0  . isosorbide-hydrALAZINE (BIDIL) 20-37.5 MG per tablet Take 1 tablet by mouth 2 (two) times daily.  180 tablet  3  . lisinopril (PRINIVIL,ZESTRIL) 5 MG tablet Take 1 tablet (5 mg total) by mouth daily.  30 tablet  6  . Multiple Vitamin (MULTIVITAMIN WITH MINERALS) TABS Take 1 tablet by mouth daily.      . polyethylene glycol (MIRALAX / GLYCOLAX) packet Take 17 g by mouth as needed.      . potassium chloride SA (K-DUR,KLOR-CON) 20 MEQ tablet Take 1 tablet (20 mEq total) by mouth 2 (two) times daily.  60 tablet  3  . promethazine (PHENERGAN) 25 MG tablet Take 25 mg by mouth every 8 (eight) hours as needed. For nausea      . traMADol (ULTRAM) 50 MG tablet Take 50 mg by mouth every 6 (six) hours  as needed. FOR PAIN       No current facility-administered medications for this encounter.     PHYSICAL EXAM: Filed Vitals:   04/21/13 0905  BP: 110/70  Pulse: 60  Weight: 164 lb 12.8 oz (74.753 kg)  SpO2: 100%    General:  Well appearing. No resp difficulty HEENT: normal Neck: supple. JVP flat. Carotids 2+ bilaterally; no bruits. No lymphadenopathy or thryomegaly appreciated. Cor: PMI normal. Regular rate & rhythm. No rubs, gallops or murmurs. No S3.  Lungs: clear Abdomen: soft, nontender, nondistended. No hepatosplenomegaly. No bruits or masses. Good bowel sounds. Extremities: no cyanosis, clubbing, rash, edema Neuro: alert & orientedx3, cranial nerves grossly intact. Moves all 4 extremities w/o difficulty. Affect pleasant.      ASSESSMENT & PLAN:

## 2013-05-16 ENCOUNTER — Encounter: Payer: Self-pay | Admitting: Internal Medicine

## 2013-05-27 ENCOUNTER — Encounter (HOSPITAL_COMMUNITY): Payer: Self-pay | Admitting: Emergency Medicine

## 2013-05-27 ENCOUNTER — Emergency Department (HOSPITAL_COMMUNITY)
Admission: EM | Admit: 2013-05-27 | Discharge: 2013-05-27 | Disposition: A | Discharge status: Home / Self Care | Payer: Medicaid Other | Attending: Emergency Medicine | Admitting: Emergency Medicine

## 2013-05-27 DIAGNOSIS — Y929 Unspecified place or not applicable: Secondary | ICD-10-CM | POA: Insufficient documentation

## 2013-05-27 DIAGNOSIS — W57XXXA Bitten or stung by nonvenomous insect and other nonvenomous arthropods, initial encounter: Secondary | ICD-10-CM | POA: Insufficient documentation

## 2013-05-27 DIAGNOSIS — S30860A Insect bite (nonvenomous) of lower back and pelvis, initial encounter: Secondary | ICD-10-CM | POA: Insufficient documentation

## 2013-05-27 DIAGNOSIS — Z7982 Long term (current) use of aspirin: Secondary | ICD-10-CM | POA: Insufficient documentation

## 2013-05-27 DIAGNOSIS — F1910 Other psychoactive substance abuse, uncomplicated: Secondary | ICD-10-CM | POA: Insufficient documentation

## 2013-05-27 DIAGNOSIS — Z79899 Other long term (current) drug therapy: Secondary | ICD-10-CM | POA: Insufficient documentation

## 2013-05-27 DIAGNOSIS — Y999 Unspecified external cause status: Secondary | ICD-10-CM | POA: Insufficient documentation

## 2013-05-27 DIAGNOSIS — E079 Disorder of thyroid, unspecified: Secondary | ICD-10-CM | POA: Insufficient documentation

## 2013-05-27 DIAGNOSIS — F1411 Cocaine abuse, in remission: Secondary | ICD-10-CM | POA: Insufficient documentation

## 2013-05-27 DIAGNOSIS — I10 Essential (primary) hypertension: Secondary | ICD-10-CM | POA: Insufficient documentation

## 2013-05-27 DIAGNOSIS — I447 Left bundle-branch block, unspecified: Secondary | ICD-10-CM | POA: Insufficient documentation

## 2013-05-27 DIAGNOSIS — Z87891 Personal history of nicotine dependence: Secondary | ICD-10-CM | POA: Insufficient documentation

## 2013-05-27 DIAGNOSIS — I4891 Unspecified atrial fibrillation: Secondary | ICD-10-CM | POA: Insufficient documentation

## 2013-05-27 DIAGNOSIS — Z95810 Presence of automatic (implantable) cardiac defibrillator: Secondary | ICD-10-CM | POA: Insufficient documentation

## 2013-05-27 DIAGNOSIS — R21 Rash and other nonspecific skin eruption: Secondary | ICD-10-CM | POA: Insufficient documentation

## 2013-05-27 DIAGNOSIS — I428 Other cardiomyopathies: Secondary | ICD-10-CM | POA: Insufficient documentation

## 2013-05-27 DIAGNOSIS — I5022 Chronic systolic (congestive) heart failure: Secondary | ICD-10-CM | POA: Insufficient documentation

## 2013-05-27 HISTORY — DX: Essential (primary) hypertension: I10

## 2013-05-27 MED ORDER — HYDROCORTISONE 1 % EX CREA: TOPICAL_CREAM | Freq: Two times a day (BID) | CUTANEOUS | Status: DC

## 2013-05-27 MED ORDER — DIPHENHYDRAMINE HCL 25 MG PO TABS: 25.0000 mg | ORAL_TABLET | Freq: Four times a day (QID) | ORAL | Status: AC | PRN

## 2013-05-27 MED ORDER — RANITIDINE HCL 150 MG PO CAPS: 150.0000 mg | ORAL_CAPSULE | Freq: Every day | ORAL | Status: DC

## 2013-05-27 NOTE — ED Notes (Signed)
PT. REPORTS ITCHY / SORE INSECT BITE TO RIGHT UPPER BACK LAST Thursday WITH REDNESS /NO DRAINAGE .

## 2013-05-27 NOTE — ED Notes (Signed)
Pt has an insect bite to her upper mid back.  Pt states she was bitten on Thursday and it has been painful and itching since.  Pt states she has been whipping the site with alcohol and applying hydrocortisone cream to the area without relief of itching.

## 2013-05-27 NOTE — ED Provider Notes (Signed)
History    CSN: [id number] Arrival date & time 05/27/13  6734  First MD Initiated Contact with Patient 05/27/13 0422     Chief Complaint  Patient presents with  . Insect Bite   (Consider location/radiation/quality/duration/timing/severity/associated sxs/prior Treatment) HPI Comments: 52 y/o female who presents for bug bit to right upper back with onset 2 days ago. Believes she may have been bitten by a spider. Patient states site has been gradually worsening since onset and is associated with itching and mild, nonradiating, pain which he describes as a soreness. Patient has been applying topical hydrocortisone without relief of symptoms. Also has tried applying topical rubbing alcohol without relief. Patient denies aggravating factors. Further denies fever, mylagias, difficulty speaking or swallowing, SOB, numbness/tingling, and extremity weakness.  The history is provided by the patient. No language interpreter was used.   Past Medical History  Diagnosis Date  . Other left bundle branch block   . Automatic implantable cardiac defibrillator in situ     a. 06/2010 s/p  s/p SJM 3231-40 Bi V ICD Ser # [id number].  Marland Kitchen Chronic systolic heart failure   . Anemia, unspecified   . Paroxysmal supraventricular tachycardia   . Nonischemic cardiomyopathy     a. 06/2010 OOH VF arrest;  b. 06/2010 Cath: nonobs dzs, EF 15-20%, glob HK;  c. 06/2010 Echo: EF 15%, diff HK, Gr 2 DD;  d. 06/2010 Cardiac MRI EF of 29%, no scar;  e. 06/2010 s/p SJM 3231-40 Bi V ICD Ser # [id number].  Marland Kitchen Atrial fibrillation     a. This occurred in setting of resuscitation efforts during VF arrest.  . Cardiac arrest     a. 06/2010  . History of tobacco abuse   . History of cocaine abuse     remote  . Thyroid disease   . SUBSTANCE ABUSE, MULTIPLE 08/13/2010    Qualifier: Diagnosis of  By: Orville Govern CMA, Carol    . Hypertension    Past Surgical History  Procedure Laterality Date  . Cardiac defibrillator placement      [facility] CD  [id number] Q defib, serial number [id number]  . Cardiac defibrillator placement     Family History  Problem Relation Age of Onset  . Heart disease Father    History  Substance Use Topics  . Smoking status: Former Smoker    Quit date: 07/28/2011  . Smokeless tobacco: Never Used     Comment: quit 2 weeks ago  . Alcohol Use: No   OB History   Grav Para Term Preterm Abortions TAB SAB Ect Mult Living   4 4 4       4      Review of Systems  Constitutional: Negative for fever.  Respiratory: Negative for shortness of breath.   Musculoskeletal: Negative for myalgias.  Skin: Positive for color change (mild erythema) and rash.  Neurological: Negative for weakness and numbness.  All other systems reviewed and are negative.    Allergies  Review of patient's allergies indicates no known allergies.  Home Medications   Current Outpatient Rx  Name  Route  Sig  Dispense  Refill  . albuterol (PROVENTIL HFA;VENTOLIN HFA) 108 (90 BASE) MCG/ACT inhaler   Inhalation   Inhale 2 puffs into the lungs every 4 (four) hours as needed. For shortness of breath or wheezing         . aspirin 81 MG chewable tablet   Oral   Chew 81 mg by mouth daily.         Marland Kitchen  carvedilol (COREG) 25 MG tablet   Oral   Take 0.5 tablets (12.5 mg total) by mouth daily.   30 tablet   3   . diphenhydrAMINE (BENADRYL) 25 MG tablet   Oral   Take 1 tablet (25 mg total) by mouth every 6 (six) hours as needed for itching (Rash).   30 tablet   0   . furosemide (LASIX) 40 MG tablet   Oral   Take 1 tablet (40 mg total) by mouth daily.   30 tablet   3   . HYDROcodone-acetaminophen (NORCO/VICODIN) 5-325 MG per tablet   Oral   Take 2 tablets by mouth every 4 (four) hours as needed for pain.   10 tablet   0   . hydrocortisone cream 1 %   Topical   Apply topically 2 (two) times daily. Do not apply to face   15 g   1   . isosorbide-hydrALAZINE (BIDIL) 20-37.5 MG per tablet   Oral   Take 1 tablet by mouth 2 (two)  times daily.   180 tablet   3   . lisinopril (PRINIVIL,ZESTRIL) 5 MG tablet   Oral   Take 1 tablet (5 mg total) by mouth daily.   30 tablet   6   . Multiple Vitamin (MULTIVITAMIN WITH MINERALS) TABS   Oral   Take 1 tablet by mouth daily.         . polyethylene glycol (MIRALAX / GLYCOLAX) packet   Oral   Take 17 g by mouth as needed.         . potassium chloride SA (K-DUR,KLOR-CON) 20 MEQ tablet   Oral   Take 1 tablet (20 mEq total) by mouth 2 (two) times daily.   60 tablet   3   . promethazine (PHENERGAN) 25 MG tablet   Oral   Take 25 mg by mouth every 8 (eight) hours as needed. For nausea         . ranitidine (ZANTAC) 150 MG capsule   Oral   Take 1 capsule (150 mg total) by mouth daily.   15 capsule   0   . traMADol (ULTRAM) 50 MG tablet   Oral   Take 50 mg by mouth every 6 (six) hours as needed. FOR PAIN          BP 118/99  Pulse 73  Temp(Src) 97.9 F (36.6 C) (Oral)  Resp 18  SpO2 99% Physical Exam  Nursing note and vitals reviewed. Constitutional: She is oriented to person, place, and time. She appears well-developed and well-nourished. No distress.  HENT:  Head: Normocephalic and atraumatic.  Right Ear: External ear normal.  Left Ear: External ear normal.  Eyes: Conjunctivae and EOM are normal. No scleral icterus.  Neck: Normal range of motion. Neck supple.  Cardiovascular: Normal rate, regular rhythm, normal heart sounds and intact distal pulses.   Pulmonary/Chest: Effort normal and breath sounds normal. No respiratory distress. She has no wheezes. She has no rales.  Musculoskeletal: Normal range of motion.       Back:  Lymphadenopathy:    She has no cervical adenopathy.  Neurological: She is alert and oriented to person, place, and time.  No sensory or motor deficits appreciated.  Skin: Skin is warm and dry. She is not diaphoretic. There is erythema. No pallor.  Bug bite to mid upper back; blanching erythema and slightly raised without  significant heat-to-touch. No drainage. No induration appreciated.  Psychiatric: She has a normal mood and affect. Her  behavior is normal.    ED Course  Procedures (including critical care time) Labs Reviewed - No data to display No results found.  1. Bug bite    MDM  Uncomplicated bug bite to mid back. Findings as above. Nonconcerning for SJS, erythema multiforme major or erythema multiforme minor. No induration or significant heat-to-touch to suspect infected or cellulitic process or abscess. No area of fluctuance appreciated. Patient neurovascularly intact, afebrile, and hemodynamically stable. Appropriate for d/c with Rx for benadryl, zantac, and hydrocortisone for symptoms. Indications for ED return discussed with patient who verbalizes comfort and understanding with d/c plan.    Antonietta Breach, PA-C 06/01/13 1846

## 2013-05-31 ENCOUNTER — Ambulatory Visit (INDEPENDENT_AMBULATORY_CARE_PROVIDER_SITE_OTHER): Payer: Medicaid Other | Admitting: *Deleted

## 2013-05-31 DIAGNOSIS — I428 Other cardiomyopathies: Secondary | ICD-10-CM

## 2013-05-31 DIAGNOSIS — Z95810 Presence of automatic (implantable) cardiac defibrillator: Secondary | ICD-10-CM

## 2013-05-31 DIAGNOSIS — I4901 Ventricular fibrillation: Secondary | ICD-10-CM

## 2013-05-31 DIAGNOSIS — I5022 Chronic systolic (congestive) heart failure: Secondary | ICD-10-CM

## 2013-05-31 LAB — ICD DEVICE OBSERVATION
AL THRESHOLD: 0.5 V
BAMS-0003: 70 {beats}/min
CHARGE TIME: 9.4 s
HV IMPEDENCE: 46 Ohm
LV LEAD THRESHOLD: 1.75 V
MODE SWITCH EPISODES: 0
PACEART VT: 0
RV LEAD THRESHOLD: 0.5 V
TOT-0006: 20110819000000
TOT-0010: 11
TZAT-0001SLOWVT: 1
TZAT-0012SLOWVT: 200 ms
TZAT-0020SLOWVT: 1 ms
TZON-0005SLOWVT: 6
TZST-0001SLOWVT: 2
TZST-0001SLOWVT: 4
TZST-0003SLOWVT: 650 V
VENTRICULAR PACING ICD: 99.32 pct

## 2013-05-31 NOTE — Progress Notes (Signed)
CRT-D + CorVue check in clinic, all functions normal, see PaceArt for full details.  ROV w/ device clinic in 3 months, Dr. Graciela Husbands in 6 mo.   [employer]

## 2013-06-02 NOTE — ED Provider Notes (Signed)
Medical screening examination/treatment/procedure(s) were performed by non-physician practitioner and as supervising physician I was immediately available for consultation/collaboration.   Dione Booze, MD 06/02/13 [phone]

## 2013-06-19 ENCOUNTER — Encounter: Payer: Self-pay | Admitting: Internal Medicine

## 2013-07-21 ENCOUNTER — Other Ambulatory Visit: Payer: Self-pay | Admitting: *Deleted

## 2013-07-21 MED ORDER — CARVEDILOL 25 MG PO TABS: 12.5000 mg | ORAL_TABLET | Freq: Every day | ORAL | Status: DC

## 2013-09-04 ENCOUNTER — Ambulatory Visit (INDEPENDENT_AMBULATORY_CARE_PROVIDER_SITE_OTHER): Payer: Medicaid Other | Admitting: *Deleted

## 2013-09-04 DIAGNOSIS — I4901 Ventricular fibrillation: Secondary | ICD-10-CM

## 2013-09-04 DIAGNOSIS — I428 Other cardiomyopathies: Secondary | ICD-10-CM

## 2013-09-04 DIAGNOSIS — I5022 Chronic systolic (congestive) heart failure: Secondary | ICD-10-CM

## 2013-09-04 LAB — ICD DEVICE OBSERVATION
AL THRESHOLD: 0.5 V
ATRIAL PACING ICD: 3.9 pct
BAMS-0001: 170 {beats}/min
FVT: 0
PACEART VT: 0
RV LEAD AMPLITUDE: 12 mv
TOT-0009: 1
TZAT-0001SLOWVT: 1
TZAT-0004SLOWVT: 8
TZAT-0019SLOWVT: 7.5 V
TZAT-0020SLOWVT: 1 ms
TZON-0004SLOWVT: 35
TZON-0010SLOWVT: 40 ms
TZST-0001SLOWVT: 2
TZST-0001SLOWVT: 3
TZST-0001SLOWVT: 4
TZST-0003SLOWVT: 650 V
TZST-0003SLOWVT: 890 V
VENTRICULAR PACING ICD: 98 pct

## 2013-09-04 NOTE — Progress Notes (Signed)
ICD check in clinic normal device function. Battery longevity 3.0 to 3.4 years. BiV pacing 98% of time. No ventricular high rate episodes. 1 mode switch lasting 8 minutes 32 seconds--SVT. ROV in 3 mths w/SK.

## 2013-09-05 ENCOUNTER — Other Ambulatory Visit: Payer: Self-pay | Admitting: Cardiology

## 2013-09-15 ENCOUNTER — Telehealth: Payer: Self-pay | Admitting: Cardiovascular Disease

## 2013-09-15 NOTE — Telephone Encounter (Signed)
New problem    Pt has some questions about meds she has taken.

## 2013-09-15 NOTE — Telephone Encounter (Signed)
Pt called because she said that for the last 3 days she has been taking Ibuprofen pt's husband medication instead of her K-dur, Because the the Ibuprofen pill looks just like the potassium. Pt took her potassium today. Pt missed taking her potassium pills two days. Pt  is not taking any blood thinner medication. Pt was made aware that she is not in danger. I will send this message to Dr. [name] for recommendations. recommendations. Pt verbalized understanding.

## 2013-09-18 NOTE — Telephone Encounter (Signed)
Should not be a problem. Angela Payne

## 2013-09-18 NOTE — Telephone Encounter (Signed)
Pt aware.

## 2013-09-25 ENCOUNTER — Encounter: Payer: Self-pay | Admitting: Internal Medicine

## 2013-10-28 ENCOUNTER — Other Ambulatory Visit: Payer: Self-pay | Admitting: Cardiology

## 2013-11-07 ENCOUNTER — Ambulatory Visit (INDEPENDENT_AMBULATORY_CARE_PROVIDER_SITE_OTHER): Payer: Medicaid Other | Admitting: Cardiovascular Disease

## 2013-11-07 ENCOUNTER — Encounter: Payer: Self-pay | Admitting: Cardiovascular Disease

## 2013-11-07 VITALS — BP 142/84 | HR 82 | Ht 63.0 in | Wt 159.0 lb

## 2013-11-07 DIAGNOSIS — I4891 Unspecified atrial fibrillation: Secondary | ICD-10-CM

## 2013-11-07 DIAGNOSIS — I428 Other cardiomyopathies: Secondary | ICD-10-CM

## 2013-11-07 DIAGNOSIS — I480 Paroxysmal atrial fibrillation: Secondary | ICD-10-CM

## 2013-11-07 DIAGNOSIS — I5022 Chronic systolic (congestive) heart failure: Secondary | ICD-10-CM

## 2013-11-07 NOTE — Progress Notes (Signed)
History of Present Illness: 52 yo AAF with h/o NICM, prior VF arrest August 2011, PAF here today for cardiac follow up. She was admitted to [facility] 01 July 2010 after an out of hospital V. Fib arrest. She was cooled on the [employer] protocol and underwent a cardiac cath which showed no evidence of obstructive CAD. Echo showed EF of 15%. Cardiac MRI showed EF of 29%. 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 She had an ICD implanted by Dr. Graciela Husbands on July 18, 2010. She has had chronic chest pain since her cardiac arrest. Echo May 2014 with LVEF=50%. She has been seen in the CHF clinic and has been released.   She is here for folllow up. She describes overall lack of energy but unchanged. No exertional chest pain. Weight is stable. No near syncope, syncope, dizziness, orthopnea or PND. She has been taking all of her medications. She uses an extra Lasix if needed for LE swelling.   Primary Care Physician: [facility]  Last Lipid Profile:Lipid Panel     Component Value Date/Time   CHOL 179 11/10/2012 1402   TRIG 97.0 11/10/2012 1402   HDL 34.80* 11/10/2012 1402   CHOLHDL 5 11/10/2012 1402   VLDL 19.4 11/10/2012 1402   LDLCALC 125* 11/10/2012 1402     Past Medical History  Diagnosis Date  . Other left bundle branch block   . Automatic implantable cardiac defibrillator in situ     a. 06/2010 s/p  s/p SJM 3231-40 Bi V ICD Ser # [id number].  Marland Kitchen Chronic systolic heart failure   . Anemia, unspecified   . Paroxysmal supraventricular tachycardia   . Nonischemic cardiomyopathy     a. 06/2010 OOH VF arrest;  b. 06/2010 Cath: nonobs dzs, EF 15-20%, glob HK;  c. 06/2010 Echo: EF 15%, diff HK, Gr 2 DD;  d. 06/2010 Cardiac MRI EF of 29%, no scar;  e. 06/2010 s/p SJM 3231-40 Bi V ICD Ser # [id number].  Marland Kitchen Atrial fibrillation     a. This occurred in setting of resuscitation efforts during VF arrest.  . Cardiac arrest     a. 06/2010  .  History of tobacco abuse   . History of cocaine abuse     remote  . Thyroid disease   . SUBSTANCE ABUSE, MULTIPLE 08/13/2010    Qualifier: Diagnosis of  By: Denyse Amass CMA, Carol    . Hypertension     Past Surgical History  Procedure Laterality Date  . Cardiac defibrillator placement      [facility] CD [id number] Q defib, serial number [id number]  . Cardiac defibrillator placement      Current Outpatient Prescriptions  Medication Sig Dispense Refill  . albuterol (PROVENTIL HFA;VENTOLIN HFA) 108 (90 BASE) MCG/ACT inhaler Inhale 2 puffs into the lungs every 4 (four) hours as needed. For shortness of breath or wheezing      . aspirin 81 MG chewable tablet Chew 81 mg by mouth daily.      . carvedilol (COREG) 25 MG tablet Take 0.5 tablets (12.5 mg total) by mouth daily.  30 tablet  4  . diphenhydrAMINE (BENADRYL) 25 MG tablet Take 1 tablet (25 mg total) by mouth every 6 (six) hours as needed for itching (Rash).  30 tablet  0  . furosemide (LASIX) 40 MG tablet Take 1 tablet (40 mg total) by mouth daily.  30 tablet  3  . HYDROcodone-acetaminophen (NORCO/VICODIN) 5-325 MG per tablet Take  2 tablets by mouth every 4 (four) hours as needed for pain.  10 tablet  0  . hydrocortisone cream 1 % Apply topically 2 (two) times daily. Do not apply to face  15 g  1  . isosorbide-hydrALAZINE (BIDIL) 20-37.5 MG per tablet Take 1 tablet by mouth 2 (two) times daily.  180 tablet  3  . lisinopril (PRINIVIL,ZESTRIL) 5 MG tablet take 1 tablet by mouth once daily  30 tablet  1  . Multiple Vitamin (MULTIVITAMIN WITH MINERALS) TABS Take 1 tablet by mouth daily.      . polyethylene glycol (MIRALAX / GLYCOLAX) packet Take 17 g by mouth daily as needed (constipation).       . potassium chloride SA (K-DUR,KLOR-CON) 20 MEQ tablet take 1 tablet by mouth twice a day  60 tablet  0  . promethazine (PHENERGAN) 25 MG tablet Take 25 mg by mouth every 8 (eight) hours as needed. For nausea      . ranitidine (ZANTAC) 150 MG capsule Take 1  capsule (150 mg total) by mouth daily.  15 capsule  0  . traMADol (ULTRAM) 50 MG tablet Take 50 mg by mouth every 6 (six) hours as needed. FOR PAIN       No current facility-administered medications for this visit.    No Known Allergies  History   Social History  . Marital Status: Married    Spouse Name: N/A    Number of Children: 4  . Years of Education: N/A   Occupational History  . Disablity    Social History Main Topics  . Smoking status: Former Smoker    Quit date: 07/28/2011  . Smokeless tobacco: Never Used     Comment: quit 2 weeks ago  . Alcohol Use: No  . Drug Use: No     Comment: history of cocaine abuse last used 2008  . Sexual Activity: Yes    Birth Control/ Protection: IUD   Other Topics Concern  . Not on file   Social History Narrative  . No narrative on file    Family History  Problem Relation Age of Onset  . Heart disease Father     Review of Systems:  As stated in the HPI and otherwise negative.   BP 142/84  Pulse 82  Ht 5\' 3"  (1.6 m)  Wt 159 lb (72.122 kg)  BMI 28.17 kg/m2  Physical Examination: General: Well developed, well nourished, NAD HEENT: OP clear, mucus membranes moist SKIN: warm, dry. No rashes. Neuro: No focal deficits Musculoskeletal: Muscle strength 5/5 all ext Psychiatric: Mood and affect normal Neck: No JVD, no carotid bruits, no thyromegaly, no lymphadenopathy. Lungs:Clear bilaterally, no wheezes, rhonci, crackles Cardiovascular: Regular rate and rhythm. No murmurs, gallops or rubs. Abdomen:Soft. Bowel sounds present. Non-tender.  Extremities: No lower extremity edema. Pulses are 2 + in the bilateral DP/PT.  EKG: NSR, rate 82 bpm. RAE. Poor R wave progression precordial leads  Echo May 2014: Left ventricle: The cavity size was normal. Wall thickness was normal. Systolic function was mildly to moderately reduced. The estimated ejection fraction was in the range of 40% to 45% (50% by overread of Dr. Gala Romney).  Features are consistent with a pseudonormal left ventricular filling pattern, with concomitant abnormal relaxation and increased filling pressure (grade 2 diastolic dysfunction). - Pulmonary arteries: PA peak pressure: 32mm Hg (S).  Assessment and Plan:   1. Non ischemic cardiomyopathy: Echo May 2014 with LVEF=50% per Dr. Gala Romney.  She is on good medical therapy. Blood pressure  is well controlled. No changes in therapy today.   2. SYSTOLIC HEART FAILURE, CHRONIC: Volume status is normal today. Weight has been stable.Continue to follow daily weights at home.  Will continue Lasix Qdaily and potassium supplementation. Dr. Graciela Husbands follows her ICD.   3. PAF: No recurrence.

## 2013-11-07 NOTE — Patient Instructions (Signed)
Your physician wants you to follow-up in:  6 months. You will receive a reminder letter in the mail two months in advance. If you don't receive a letter, please call our office to schedule the follow-up appointment.   

## 2013-12-11 ENCOUNTER — Encounter: Payer: Medicaid Other | Admitting: Internal Medicine

## 2013-12-19 ENCOUNTER — Encounter: Payer: Self-pay | Admitting: Cardiology

## 2013-12-19 ENCOUNTER — Encounter (INDEPENDENT_AMBULATORY_CARE_PROVIDER_SITE_OTHER): Payer: Self-pay

## 2013-12-19 ENCOUNTER — Encounter (HOSPITAL_COMMUNITY): Payer: Self-pay | Admitting: Emergency Medicine

## 2013-12-19 ENCOUNTER — Ambulatory Visit (INDEPENDENT_AMBULATORY_CARE_PROVIDER_SITE_OTHER): Payer: Medicaid Other | Admitting: Cardiology

## 2013-12-19 ENCOUNTER — Emergency Department (HOSPITAL_COMMUNITY)
Admission: EM | Admit: 2013-12-19 | Discharge: 2013-12-19 | Disposition: A | Discharge status: Home / Self Care | Payer: Medicaid Other | Source: Home / Self Care

## 2013-12-19 ENCOUNTER — Encounter: Payer: Self-pay | Admitting: Internal Medicine

## 2013-12-19 VITALS — BP 140/90 | HR 80 | Ht 63.0 in | Wt 159.0 lb

## 2013-12-19 DIAGNOSIS — IMO0002 Reserved for concepts with insufficient information to code with codable children: Secondary | ICD-10-CM

## 2013-12-19 DIAGNOSIS — K140 Glossitis: Secondary | ICD-10-CM

## 2013-12-19 DIAGNOSIS — Z95810 Presence of automatic (implantable) cardiac defibrillator: Secondary | ICD-10-CM

## 2013-12-19 DIAGNOSIS — I471 Supraventricular tachycardia: Secondary | ICD-10-CM

## 2013-12-19 DIAGNOSIS — Z8674 Personal history of sudden cardiac arrest: Secondary | ICD-10-CM

## 2013-12-19 DIAGNOSIS — I428 Other cardiomyopathies: Secondary | ICD-10-CM

## 2013-12-19 DIAGNOSIS — I5022 Chronic systolic (congestive) heart failure: Secondary | ICD-10-CM

## 2013-12-19 LAB — POCT URINALYSIS DIP (DEVICE)
BILIRUBIN URINE: NEGATIVE
GLUCOSE, UA: NEGATIVE mg/dL
HGB URINE DIPSTICK: NEGATIVE
Ketones, ur: NEGATIVE mg/dL
LEUKOCYTES UA: NEGATIVE
NITRITE: NEGATIVE
Protein, ur: NEGATIVE mg/dL
Specific Gravity, Urine: >=1.03 (ref 1.005–1.030)
Urobilinogen, UA: 1 mg/dL (ref 0.0–1.0)
pH: 6 (ref 5.0–8.0)

## 2013-12-19 LAB — MDC_IDC_ENUM_SESS_TYPE_INCLINIC
Battery Remaining Longevity: 31.2 mo
Brady Statistic RV Percent Paced: 96 %
Date Time Interrogation Session: 20150120131446
HIGH POWER IMPEDANCE MEASURED VALUE: 43 Ohm
HIGH POWER IMPEDANCE MEASURED VALUE: 43.2321
Implantable Pulse Generator Serial Number: 621486
Lead Channel Impedance Value: 350 Ohm
Lead Channel Impedance Value: 475 Ohm
Lead Channel Impedance Value: 812.5 Ohm
Lead Channel Pacing Threshold Amplitude: 1 V
Lead Channel Pacing Threshold Amplitude: 1 V
Lead Channel Pacing Threshold Amplitude: 2 V
Lead Channel Pacing Threshold Amplitude: 2 V
Lead Channel Pacing Threshold Pulse Width: 0.5 ms
Lead Channel Pacing Threshold Pulse Width: 0.8 ms
Lead Channel Sensing Intrinsic Amplitude: 12 mV
Lead Channel Setting Sensing Sensitivity: 0.5 mV
MDC IDC MSMT LEADCHNL LV PACING THRESHOLD PULSEWIDTH: 0.8 ms
MDC IDC MSMT LEADCHNL RA PACING THRESHOLD AMPLITUDE: 0.5 V
MDC IDC MSMT LEADCHNL RA PACING THRESHOLD AMPLITUDE: 0.5 V
MDC IDC MSMT LEADCHNL RA PACING THRESHOLD PULSEWIDTH: 0.5 ms
MDC IDC MSMT LEADCHNL RA PACING THRESHOLD PULSEWIDTH: 0.5 ms
MDC IDC MSMT LEADCHNL RA SENSING INTR AMPL: 3.1 mV
MDC IDC MSMT LEADCHNL RV PACING THRESHOLD PULSEWIDTH: 0.5 ms
MDC IDC SET LEADCHNL LV PACING AMPLITUDE: 3 V
MDC IDC SET LEADCHNL LV PACING PULSEWIDTH: 0.8 ms
MDC IDC SET LEADCHNL RA PACING AMPLITUDE: 1.5 V
MDC IDC SET LEADCHNL RV PACING AMPLITUDE: 2.5 V
MDC IDC SET LEADCHNL RV PACING PULSEWIDTH: 0.5 ms
MDC IDC STAT BRADY RA PERCENT PACED: 0.65 %
Zone Setting Detection Interval: 250 ms
Zone Setting Detection Interval: 300 ms

## 2013-12-19 NOTE — ED Notes (Signed)
C/o  Lower back pain.  Dizziness.  States "feels like urine is off".  Using advil for pain.  Also c/o discoloration of tongue that she she noticed today.   Denies any other  Symptoms.

## 2013-12-19 NOTE — Discharge Instructions (Signed)
Back Pain, Adult Low back pain is very common. About 1 in 5 people have back pain.The cause of low back pain is rarely dangerous. The pain often gets better over time.About half of people with a sudden onset of back pain feel better in just 2 weeks. About 8 in 10 people feel better by 6 weeks.  CAUSES Some common causes of back pain include:  Strain of the muscles or ligaments supporting the spine.  Wear and tear (degeneration) of the spinal discs.  Arthritis.  Direct injury to the back. DIAGNOSIS Most of the time, the direct cause of low back pain is not known.However, back pain can be treated effectively even when the exact cause of the pain is unknown.Answering your caregiver's questions about your overall health and symptoms is one of the most accurate ways to make sure the cause of your pain is not dangerous. If your caregiver needs more information, he or she may order lab work or imaging tests (X-rays or MRIs).However, even if imaging tests show changes in your back, this usually does not require surgery. HOME CARE INSTRUCTIONS For many people, back pain returns.Since low back pain is rarely dangerous, it is often a condition that people can learn to [facility] their own.   Remain active. It is stressful on the back to sit or stand in one place. Do not sit, drive, or stand in one place for more than 30 minutes at a time. Take short walks on level surfaces as soon as pain allows.Try to increase the length of time you walk each day.  Do not stay in bed.Resting more than 1 or 2 days can delay your recovery.  Do not avoid exercise or work.Your body is made to move.It is not dangerous to be active, even though your back may hurt.Your back will likely heal faster if you return to being active before your pain is gone.  Pay attention to your body when you bend and lift. Many people have less discomfortwhen lifting if they bend their knees, keep the load close to their bodies,and  avoid twisting. Often, the most comfortable positions are those that put less stress on your recovering back.  Find a comfortable position to sleep. Use a firm mattress and lie on your side with your knees slightly bent. If you lie on your back, put a pillow under your knees.  Only take over-the-counter or prescription medicines as directed by your caregiver. Over-the-counter medicines to reduce pain and inflammation are often the most helpful.Your caregiver may prescribe muscle relaxant drugs.These medicines help dull your pain so you can more quickly return to your normal activities and healthy exercise.  Put ice on the injured area.  Put ice in a plastic bag.  Place a towel between your skin and the bag.  Leave the ice on for 15-20 minutes, 03-04 times a day for the first 2 to 3 days. After that, ice and heat may be alternated to reduce pain and spasms.  Ask your caregiver about trying back exercises and gentle massage. This may be of some benefit.  Avoid feeling anxious or stressed.Stress increases muscle tension and can worsen back pain.It is important to recognize when you are anxious or stressed and learn ways to manage it.Exercise is a great option. SEEK MEDICAL CARE IF:  You have pain that is not relieved with rest or medicine.  You have pain that does not improve in 1 week.  You have new symptoms.  You are generally not feeling well. SEEK  IMMEDIATE MEDICAL CARE IF:   You have pain that radiates from your back into your legs.  You develop new bowel or bladder control problems.  You have unusual weakness or numbness in your arms or legs.  You develop nausea or vomiting.  You develop abdominal pain.  You feel faint. Document Released: 11/16/2005 Document Revised: 05/17/2012 Document Reviewed: 04/06/2011 [facility] Patient Information 2014 [place], [place].  Glossitis Glossitis is an inflammation of the tongue. Changes in the appearance of the tongue may be a  primary tongue disorder. This means the problem is only in the tongue. Glossitis may be a symptom of other disorders. CAUSES   Excessive alcohol.  Multiple allergies.  Infections.  Tobacco and nicotine use.  Anemia.  Mechanical injury.  Spicy foods.  Vitamin B deficiency.  Damage from chemicals or hot food or drink.  Viruses Lumbosacral Strain Lumbosacral strain is a strain of any of the parts that make up your lumbosacral vertebrae. Your lumbosacral vertebrae are the bones that make up the lower third of your backbone. Your lumbosacral vertebrae are held together by muscles and tough, fibrous tissue (ligaments).  CAUSES  A sudden blow to your back can cause lumbosacral strain. Also, anything that causes an excessive stretch of the muscles in the low back can cause this strain. This is typically seen when people exert themselves strenuously, fall, lift heavy objects, bend, or crouch repeatedly. RISK FACTORS Physically demanding work. Participation in pushing or pulling sports or sports that require sudden twist of the back (tennis, golf, baseball). Weight lifting. Excessive lower back curvature. Forward-tilted pelvis. Weak back or abdominal muscles or both. Tight hamstrings. SIGNS AND SYMPTOMS  Lumbosacral strain may cause pain in the area of your injury or pain that moves (radiates) down your leg.  DIAGNOSIS Your health care provider can often diagnose lumbosacral strain through a physical exam. In some cases, you may need tests such as X-ray exams.  TREATMENT  Treatment for your lower back injury depends on many factors that your clinician will have to evaluate. However, most treatment will include the use of anti-inflammatory medicines. HOME CARE INSTRUCTIONS  Avoid hard physical activities (tennis, racquetball, waterskiing) if you are not in proper physical condition for it. This may aggravate or create problems. If you have a back problem, avoid sports requiring sudden  body movements. Swimming and walking are generally safer activities. Maintain good posture. Maintain a healthy weight. For acute conditions, you may put ice on the injured area. Put ice in a plastic bag. Place a towel between your skin and the bag. Leave the ice on for 20 minutes, 2 3 times a day. When the low back starts healing, stretching and strengthening exercises may be recommended. SEEK MEDICAL CARE IF: Your back pain is getting worse. You experience severe back pain not relieved with medicines. SEEK IMMEDIATE MEDICAL CARE IF:  You have numbness, tingling, weakness, or problems with the use of your arms or legs. There is a change in bowel or bladder control. You have increasing pain in any area of the body, including your belly (abdomen). You notice shortness of breath, dizziness, or feel faint. You feel sick to your stomach (nauseous), are throwing up (vomiting), or become sweaty. You notice discoloration of your toes or legs, or your feet get very cold. MAKE SURE YOU:  Understand these instructions. Will watch your condition. Will get help right away if you are not doing well or get worse. Document Released: 08/26/2005 Document Revised: 09/06/2013 Document Reviewed: 07/05/2013 [employer] Patient  Information 2014 [employer], [place].  SYMPTOMS  There may be swelling and color changes in the tongue. Sometimes the surface of the tongue may look smooth. This disorder may be painless. But the tongue is usually sore and tender. It can be fiery red if condition is caused by deficiency of B vitamins. It is sometimes pale if there is anemia. Anemia means there are not enough red blood cells. There may be problems with chewing, swallowing and speaking. In some cases, glossitis may result in severe tongue swelling that blocks the airway. DIAGNOSIS  The diagnosis of glossitis is made easily by physical exam and asking for a history. Sometimes blood tests may be done. TREATMENT   The goal of  treatment is to reduce inflammation. Hospitalization is usually not necessary unless tongue swelling is severe.  Good oral hygiene is important. This means good tooth brushing at least twice a day, and flossing daily for treatment and prevention.  Corticosteroids such as prednisone may be given to reduce the redness and soreness.  Medications may be prescribed if the cause of glossitis is an infection.  Other problems such as anemia and nutritional deficiencies are treated. This may be a dietary change or vitamin supplements. Avoid hot or spicy foods, alcohol, and tobacco. This lessens the discomfort.  Avoid anything that is irritating to your mouth or tongue. Glossitis usually responds well to treatment if the cause of it is removed or treated.  SEEK IMMEDIATE MEDICAL CARE IF:   Symptoms of glossitis persist for longer than 10 days.  Tongue swelling is severe and breathing, speaking, chewing, or swallowing difficulties are present.  You have no relief from medications given.  You develop difficulties breathing. Document Released: 11/06/2002 Document Revised: 02/08/2012 Document Reviewed: 12/21/2008 [facility] Patient Information 2014 [place].

## 2013-12-19 NOTE — Progress Notes (Signed)
Patient ID: CATERA HANKINS MRN: [id number], DOB/AGE: 52-17-1962   Date of Visit: 12/19/2013  Primary Physician: Dellis Anes, MD Primary Cardiologist: [name], MD Primary EP: Graciela Husbands, MD Reason for Visit: EP/device follow-up  History of Present Illness  Angela Payne is a 53 y.o. female with aborted VF arrest, NICM, chronic systolic HF s/p CRT-D implant and PSVT who presents today for routine electrophysiology followup.   Since last being seen in our clinic, she reports she is doing well and has no complaints. She has chronic DOE which she tells me is stable without worsening from baseline. She reports occasional palpitations. She denies dizziness, near syncope or syncope. She denies LE swelling, orthopnea, PND or recent weight gain. She is compliant and tolerating medications without difficulty although she has not taken her AM meds so far today due to her appointment time.  Past Medical History Past Medical History  Diagnosis Date  . Other left bundle branch block   . Automatic implantable cardiac defibrillator in situ     a. 06/2010 s/p  s/p SJM 3231-40 Bi V ICD Ser # [id number].  Marland Kitchen Chronic systolic heart failure   . Anemia, unspecified   . Paroxysmal supraventricular tachycardia   . Nonischemic cardiomyopathy     a. 06/2010 OOH VF arrest;  b. 06/2010 Cath: nonobs dzs, EF 15-20%, glob HK;  c. 06/2010 Echo: EF 15%, diff HK, Gr 2 DD;  d. 06/2010 Cardiac MRI EF of 29%, no scar;  e. 06/2010 s/p SJM 3231-40 Bi V ICD Ser # [id number].  Marland Kitchen Atrial fibrillation     a. This occurred in setting of resuscitation efforts during VF arrest.  . Cardiac arrest     a. 06/2010  . History of tobacco abuse   . History of cocaine abuse     remote  . Thyroid disease   . SUBSTANCE ABUSE, MULTIPLE 08/13/2010    Qualifier: Diagnosis of  By: Denyse Amass CMA, Carol    . Hypertension     Past Surgical History Past Surgical History  Procedure Laterality Date  . Cardiac defibrillator placement      [facility] CD  [id number] Q defib, serial number [id number]  . Cardiac defibrillator placement      Allergies/Intolerances No Known Allergies  Current Home Medications Current Outpatient Prescriptions  Medication Sig Dispense Refill  . albuterol (PROVENTIL HFA;VENTOLIN HFA) 108 (90 BASE) MCG/ACT inhaler Inhale 2 puffs into the lungs every 4 (four) hours as needed. For shortness of breath or wheezing      . aspirin 81 MG chewable tablet Chew 81 mg by mouth daily.      . carvedilol (COREG) 25 MG tablet Take 0.5 tablets (12.5 mg total) by mouth daily.  30 tablet  4  . diphenhydrAMINE (BENADRYL) 25 MG tablet Take 1 tablet (25 mg total) by mouth every 6 (six) hours as needed for itching (Rash).  30 tablet  0  . furosemide (LASIX) 40 MG tablet Take 1 tablet (40 mg total) by mouth daily.  30 tablet  3  . HYDROcodone-acetaminophen (NORCO/VICODIN) 5-325 MG per tablet Take 2 tablets by mouth every 4 (four) hours as needed for pain.  10 tablet  0  . hydrocortisone cream 1 % Apply topically 2 (two) times daily. Do not apply to face  15 g  1  . isosorbide-hydrALAZINE (BIDIL) 20-37.5 MG per tablet Take 1 tablet by mouth 2 (two) times daily.  180 tablet  3  . lisinopril (PRINIVIL,ZESTRIL) 5 MG tablet take 1 tablet by mouth  once daily  30 tablet  1  . Multiple Vitamin (MULTIVITAMIN WITH MINERALS) TABS Take 1 tablet by mouth daily.      . polyethylene glycol (MIRALAX / GLYCOLAX) packet Take 17 g by mouth daily as needed (constipation).       . potassium chloride SA (K-DUR,KLOR-CON) 20 MEQ tablet take 1 tablet by mouth twice a day  60 tablet  0  . promethazine (PHENERGAN) 25 MG tablet Take 25 mg by mouth every 8 (eight) hours as needed. For nausea      . ranitidine (ZANTAC) 150 MG capsule Take 1 capsule (150 mg total) by mouth daily.  15 capsule  0  . traMADol (ULTRAM) 50 MG tablet Take 50 mg by mouth every 6 (six) hours as needed. FOR PAIN       No current facility-administered medications for this visit.    Social  History History   Social History  . Marital Status: Married    Spouse Name: N/A    Number of Children: 4  . Years of Education: N/A   Occupational History  . Disablity    Social History Main Topics  . Smoking status: Former Smoker    Quit date: 07/28/2011  . Smokeless tobacco: Never Used     Comment: quit 2 weeks ago  . Alcohol Use: No  . Drug Use: No     Comment: history of cocaine abuse last used 2008  . Sexual Activity: Yes    Birth Control/ Protection: IUD   Other Topics Concern  . Not on file   Social History Narrative  . No narrative on file     Review of Systems General: No chills, fever, night sweats or weight changes Cardiovascular: No chest pain, dyspnea on exertion, edema, orthopnea, palpitations, paroxysmal nocturnal dyspnea Dermatological: No rash, lesions or masses Respiratory: No cough, dyspnea Urologic: No hematuria, dysuria Abdominal: No nausea, vomiting, diarrhea, bright red blood per rectum, melena, or hematemesis Neurologic: No visual changes, weakness, changes in mental status All other systems reviewed and are otherwise negative except as noted above.  Physical Exam Vitals: Blood pressure 140/90, pulse 80, height 5\' 3"  (1.6 m), weight 159 lb (72.122 kg).  General: Well developed, well appearing 53 y.o. female in no acute distress. HEENT: Normocephalic, atraumatic. EOMs intact. Sclera nonicteric. Oropharynx clear.  Neck: Supple. No JVD. Lungs: Respirations regular and unlabored, CTA bilaterally. No wheezes, rales or rhonchi. Heart: RRR. S1, S2 present. No murmurs, rub, S3 or S4. Abdomen: Soft, non-distended.   Extremities: No clubbing, cyanosis or edema. PT/Radials 2+ and equal bilaterally. Psych: Normal affect. Neuro: Alert and oriented X 3. Moves all extremities spontaneously. Skin: Left upper chest / implant site intact and well healed.   Diagnostics Device interrogation today - Thresholds and sensing consistent with previous device  measurements. Lead impedance trends stable over time. 3 mode switch episodes recorded, longest 7 minutes, EGMs consistent with SVT. No ventricular arrhythmia episodes recorded. Patient bi-ventricularly pacing 96% of the time. Device programmed with appropriate safety margins. Heart failure diagnostics reviewed and trends are stable for patient. No changes made this session. Estimated longevity 2.6 - 3.1 years.  Assessment and Plan 1. Prior VF arrest with NICM s/p CRT-D implant - normal device function - no programming changes made - discussed remote monitoring; however, Ms. Fohl requests office visits only - return to device clinic in 3 months - return for follow-up with Dr. Graciela Husbands in 6 months 2. Chronic systolic HF - stable without worsening HF symptoms at this  time - continue medical therapy 3. PSVT - continue BB  Signed, Shantel Wesely, PA-C 12/19/2013, 11:12 AM

## 2013-12-19 NOTE — ED Provider Notes (Signed)
CSN: [id number]     Arrival date & time 12/19/13  1427 History   First MD Initiated Contact with Patient 12/19/13 1627     Chief Complaint  Patient presents with  . Back Pain  . Dizziness   (Consider location/radiation/quality/duration/timing/severity/associated sxs/prior Treatment) HPI Comments: 53 year old female complaining of mid to low back pain is exacerbated by movement touch is forward flexion and rotation at this time. Denies any known injury. She also complains of small bumps or papules that sustaining burn on her tongue.   Past Medical History  Diagnosis Date  . Other left bundle branch block   . Automatic implantable cardiac defibrillator in situ     a. 06/2010 s/p  s/p SJM 3231-40 Bi V ICD Ser # [id number].  Marland Kitchen. Chronic systolic heart failure   . Anemia, unspecified   . Paroxysmal supraventricular tachycardia   . Nonischemic cardiomyopathy     a. 06/2010 OOH VF arrest;  b. 06/2010 Cath: nonobs dzs, EF 15-20%, glob HK;  c. 06/2010 Echo: EF 15%, diff HK, Gr 2 DD;  d. 06/2010 Cardiac MRI EF of 29%, no scar;  e. 06/2010 s/p SJM 3231-40 Bi V ICD Ser # [id number].  Marland Kitchen. Atrial fibrillation     a. This occurred in setting of resuscitation efforts during VF arrest.  . Cardiac arrest     a. 06/2010  . History of tobacco abuse   . History of cocaine abuse     remote  . Thyroid disease   . SUBSTANCE ABUSE, MULTIPLE 08/13/2010    Qualifier: Diagnosis of  By: Denyse AmassFiato, CMA, Carol    . Hypertension    Past Surgical History  Procedure Laterality Date  . Cardiac defibrillator placement      [facility] [id number] Q defib, serial number [id number]  . Cardiac defibrillator placement     Family History  Problem Relation Age of Onset  . Heart disease Father    History  Substance Use Topics  . Smoking status: Former Smoker    Quit date: 07/28/2011  . Smokeless tobacco: Never Used     Comment: quit 2 weeks ago  . Alcohol Use: No   OB History   Grav Para Term Preterm Abortions TAB SAB Ect Mult Living    4 4 4       4      Review of Systems  Constitutional: Negative.   HENT: Positive for mouth sores. Negative for congestion, ear discharge, ear pain, postnasal drip, rhinorrhea, sinus pressure and sore throat.        As per history of present illness  Respiratory: Negative.   Gastrointestinal: Negative.   Genitourinary: Negative.   Musculoskeletal: Positive for back pain.  Neurological: Negative.     Allergies  Review of patient's allergies indicates no known allergies.  Home Medications   Current Outpatient Rx  Name  Route  Sig  Dispense  Refill  . albuterol (PROVENTIL HFA;VENTOLIN HFA) 108 (90 BASE) MCG/ACT inhaler   Inhalation   Inhale 2 puffs into the lungs every 4 (four) hours as needed. For shortness of breath or wheezing         . aspirin 81 MG chewable tablet   Oral   Chew 81 mg by mouth daily.         . carvedilol (COREG) 25 MG tablet   Oral   Take 0.5 tablets (12.5 mg total) by mouth daily.   30 tablet   4   . furosemide (LASIX) 40 MG tablet  Oral   Take 1 tablet (40 mg total) by mouth daily.   30 tablet   3   . isosorbide-hydrALAZINE (BIDIL) 20-37.5 MG per tablet   Oral   Take 1 tablet by mouth 2 (two) times daily.   180 tablet   3   . lisinopril (PRINIVIL,ZESTRIL) 5 MG tablet      take 1 tablet by mouth once daily   30 tablet   1   . Multiple Vitamin (MULTIVITAMIN WITH MINERALS) TABS   Oral   Take 1 tablet by mouth daily.         . polyethylene glycol (MIRALAX / GLYCOLAX) packet   Oral   Take 17 g by mouth daily as needed (constipation).          . potassium chloride SA (K-DUR,KLOR-CON) 20 MEQ tablet      take 1 tablet by mouth twice a day   60 tablet   0   . ranitidine (ZANTAC) 150 MG capsule   Oral   Take 1 capsule (150 mg total) by mouth daily.   15 capsule   0   . diphenhydrAMINE (BENADRYL) 25 MG tablet   Oral   Take 1 tablet (25 mg total) by mouth every 6 (six) hours as needed for itching (Rash).   30 tablet   0    . HYDROcodone-acetaminophen (NORCO/VICODIN) 5-325 MG per tablet   Oral   Take 2 tablets by mouth every 4 (four) hours as needed for pain.   10 tablet   0   . hydrocortisone cream 1 %   Topical   Apply topically 2 (two) times daily. Do not apply to face   15 g   1   . promethazine (PHENERGAN) 25 MG tablet   Oral   Take 25 mg by mouth every 8 (eight) hours as needed. For nausea         . traMADol (ULTRAM) 50 MG tablet   Oral   Take 50 mg by mouth every 6 (six) hours as needed. FOR PAIN          BP 127/86  Pulse 67  Temp(Src) 98.4 F (36.9 C) (Oral)  Resp 16  SpO2 100% Physical Exam  Nursing note and vitals reviewed. Constitutional: She is oriented to person, place, and time. She appears well-developed and well-nourished. No distress.  HENT:  Head: Normocephalic and atraumatic.  Eyes: EOM are normal.  Neck: Normal range of motion. Neck supple.  Cardiovascular: Normal rate and regular rhythm.   Pulmonary/Chest: Effort normal and breath sounds normal.  Musculoskeletal: She exhibits tenderness. She exhibits no edema.  Patient reproduces pain with specific movements of the mid and lower back. There is tenderness to the para thoracic and lumbar musculature. No deformity or discoloration. Normal balance and smooth gait. No spinal tenderness.  Lymphadenopathy:    She has no cervical adenopathy.  Neurological: She is alert and oriented to person, place, and time. No cranial nerve deficit.  Skin: Skin is warm and dry.  Psychiatric: She has a normal mood and affect.    ED Course  Procedures (including critical care time) Labs Review Labs Reviewed  POCT URINALYSIS DIP (DEVICE)   Imaging Review No results found.    MDM   1. Back sprain or strain   2. Tongue inflammation      Magic mouthwash Back stretches as demonstrated, heat and NSaids. Limit heavy lifting..     ]  Hayden Rasmussen, NP 12/19/13 [phone]

## 2013-12-19 NOTE — Patient Instructions (Signed)
Your physician recommends that you keep your schedule a follow-up appointment on 03/21/14 at 9:30am with device Clinic  Your physician wants you to follow-up in 6 months with Dr. Logan Bores will receive a reminder letter in the mail two months in advance. If you don't receive a letter, please call our office to schedule the follow-up appointment.  Your physician recommends that you continue on your current medications as directed. Please refer to the Current Medication list given to you today.

## 2013-12-19 NOTE — ED Provider Notes (Signed)
Medical screening examination/treatment/procedure(s) were performed by resident physician or non-physician practitioner and as supervising physician I was immediately available for consultation/collaboration.   Barkley Bruns MD.   Linna Hoff, MD 12/19/13 [phone]

## 2013-12-21 ENCOUNTER — Emergency Department (HOSPITAL_COMMUNITY)
Admission: EM | Admit: 2013-12-21 | Discharge: 2013-12-21 | Disposition: A | Discharge status: Home / Self Care | Payer: Medicaid Other | Attending: Emergency Medicine | Admitting: Emergency Medicine

## 2013-12-21 ENCOUNTER — Encounter (HOSPITAL_COMMUNITY): Payer: Self-pay | Admitting: Emergency Medicine

## 2013-12-21 DIAGNOSIS — K1370 Unspecified lesions of oral mucosa: Secondary | ICD-10-CM | POA: Insufficient documentation

## 2013-12-21 DIAGNOSIS — Z862 Personal history of diseases of the blood and blood-forming organs and certain disorders involving the immune mechanism: Secondary | ICD-10-CM | POA: Insufficient documentation

## 2013-12-21 DIAGNOSIS — I4891 Unspecified atrial fibrillation: Secondary | ICD-10-CM | POA: Insufficient documentation

## 2013-12-21 DIAGNOSIS — Z8639 Personal history of other endocrine, nutritional and metabolic disease: Secondary | ICD-10-CM | POA: Insufficient documentation

## 2013-12-21 DIAGNOSIS — K148 Other diseases of tongue: Secondary | ICD-10-CM | POA: Insufficient documentation

## 2013-12-21 DIAGNOSIS — Z95810 Presence of automatic (implantable) cardiac defibrillator: Secondary | ICD-10-CM | POA: Insufficient documentation

## 2013-12-21 DIAGNOSIS — IMO0002 Reserved for concepts with insufficient information to code with codable children: Secondary | ICD-10-CM | POA: Insufficient documentation

## 2013-12-21 DIAGNOSIS — Z7982 Long term (current) use of aspirin: Secondary | ICD-10-CM | POA: Insufficient documentation

## 2013-12-21 DIAGNOSIS — I252 Old myocardial infarction: Secondary | ICD-10-CM | POA: Insufficient documentation

## 2013-12-21 DIAGNOSIS — I10 Essential (primary) hypertension: Secondary | ICD-10-CM | POA: Insufficient documentation

## 2013-12-21 DIAGNOSIS — Z87891 Personal history of nicotine dependence: Secondary | ICD-10-CM | POA: Insufficient documentation

## 2013-12-21 DIAGNOSIS — Z79899 Other long term (current) drug therapy: Secondary | ICD-10-CM | POA: Insufficient documentation

## 2013-12-21 DIAGNOSIS — K146 Glossodynia: Secondary | ICD-10-CM | POA: Diagnosis present

## 2013-12-21 DIAGNOSIS — I5022 Chronic systolic (congestive) heart failure: Secondary | ICD-10-CM | POA: Insufficient documentation

## 2013-12-21 DIAGNOSIS — M549 Dorsalgia, unspecified: Secondary | ICD-10-CM | POA: Insufficient documentation

## 2013-12-21 NOTE — Discharge Instructions (Signed)
°Emergency Department Resource Guide °1) Find a Doctor and Pay Out of Pocket °Although you won't have to find out who is covered by your insurance plan, it is a good idea to ask around and get recommendations. You will then need to call the office and see if the doctor you have chosen will accept you as a new patient and what types of options they offer for patients who are self-pay. Some doctors offer discounts or will set up payment plans for their patients who do not have insurance, but you will need to ask so you aren't surprised when you get to your appointment. ° °2) Contact Your Local Health Department °Not all health departments have doctors that can see patients for sick visits, but many do, so it is worth a call to see if yours does. If you don't know where your local health department is, you can check in your phone book. The CDC also has a tool to help you locate your state's health department, and many state websites also have listings of all of their local health departments. ° °3) Find a Walk-in Clinic °If your illness is not likely to be very severe or complicated, you may want to try a walk in clinic. These are popping up all over the country in pharmacies, drugstores, and shopping centers. They're usually staffed by nurse practitioners or physician assistants that have been trained to treat common illnesses and complaints. They're usually fairly quick and inexpensive. However, if you have serious medical issues or chronic medical problems, these are probably not your best option. ° °No Primary Care Doctor: °- Call [facility] at  832-8000 - they can help you locate a primary care doctor that  accepts your insurance, provides certain services, etc. °- Physician Referral Service- 1-800-533-3463 ° °Chronic Pain Problems: °Organization         Address  Phone   Notes  °[facility]  (336) 297-2271 Patients need to be referred by their primary care doctor.  ° °Medication  Assistance: °Organization         Address  Phone   Notes  °[place] [facility] [address] °[place], [place] [address] (336) 641-8030 --Must be a resident of [place] °-- Must have NO insurance coverage whatsoever (no Medicaid/ Medicare, etc.) °-- The pt. MUST have a primary care doctor that directs their care regularly and follows them in the community °  °[facility]  (866) 331-1348   °[employer]  (888) 892-1162   ° °Agencies that provide inexpensive medical care: °Organization         Address  Phone   Notes  °[facility]  (336) 832-8035   °[facility]    (336) 832-7272   °[facility] [address] °[place], [place] [address] (336) 832-4777   °[facility] [address], °[place] (336) 271-4999   °[facility]    (336) 373-0678   °[facility]    (336) 272-1050   °[facility] ° [address], [place] Phone:  (336) 832-4444, Fax:  (336) 832-4440 Hours of Operation:  9 am - 6 pm, M-F.  Also accepts Medicaid/Medicare and self-pay.  °[facility] ° [address], [place] Phone: (336) 832-3150, Fax: (336) [phone]. Hours of Operation:  8:30 am - 5:30 pm, M-F.  Also accepts Medicaid and self-pay.  °[facility] [place] [address], [place] Phone: (336) 878-6027   °[facility] [address], [place], [place] (336)723-1848, Ext. 123 Mondays & Thursdays: 7-9 AM.  First 15 patients are seen on a first come, first serve basis. °  ° °Medicaid-accepting [place] Providers: ° °Organization         Address  Phone   Notes  °[facility] [address], [place] (336) 641-2100 Also accepts self-pay patients.  °[facility] [address], [place] ° (336) 856-9996   °[facility] [address], [place]  (336) 288-8857   °[facility] [address] [place] [address], [place] (336) 299-7000   °Veita Bland [address], [place]  ° (336) 373-1557 Only accepts [place] Access Medicaid patients after they have their name applied to their card.  ° °Self-Pay (no insurance) in [place]: ° °Organization         Address  Phone   Notes  °Sickle Cell Patients, [facility] [address], [place] (336) 832-1970   °[facility] [address], [place] (336) 832-4400   °[facility] ° [address], [place] (336) 992-4800   °[facility]/Dr. Osei-Bonsu ° [address] [place] [address], [place] or [address], [place] (336) 841-8500 Phone number for both [place] and [place] locations is the same.  °[facility] [address], [place] (336) 299-0000   °[facility] [address] [place] [address], [place] or [address] (336) 852-7530 °(336) 878-2260   °[facility] [address], [place] (336) 350-1642, phone; (336) 294-5005, fax Sees patients 1st and 3rd Saturday of every month.  Must not qualify for public or private insurance (i.e. Medicaid, Medicare, [place] Health Choice, Veterans' Benefits) • Household income should be no more than 200% of the poverty level •The clinic cannot treat you if you are pregnant or think you are pregnant • Sexually transmitted diseases are not treated at the clinic.  ° ° °Dental Care: °Organization         Address  Phone  Notes  °[place] [facility] [address], [place] (336) 641-6152 Accepts children up to age 21 who are enrolled in Medicaid or [place] Health Choice; pregnant women with a Medicaid card; and children who have applied for Medicaid or [place] Health Choice, but were declined, whose parents can pay a reduced fee at time of service.  °[place]  [facility] [place]  [address], [place] (336) 641-7733 Accepts children up to age 21 who are enrolled in Medicaid or [place] Health Choice; pregnant women with a Medicaid card; and children who have applied for Medicaid or [place] Health Choice, but were declined, whose parents can pay a reduced fee at time of service.  °[facility] ° [address], [place] (336) 641-4533 Patients are seen by appointment only. Walk-ins are not accepted. [facility] will see patients 18 years of age and older. °Monday - Tuesday (8am-5pm) °Most Wednesdays (8:30-5pm) °$30 per visit, cash only  °[facility] ° [address], [place] (336) 641-4533 Patients are seen by appointment only. Walk-ins are not accepted. [facility] will see patients 18 years of age and older. °One   Wednesday Evening (Monthly: Volunteer Based).  $30 per visit, cash only  °[facility]  (919) 537-3737 for adults; Children under age 4, call Graduate Pediatric Dentistry at (919) [phone]. Children aged 4-14, please call (919) [phone] to request a pediatric application. ° Dental services are provided in all areas of dental care including fillings, crowns and bridges, complete and partial dentures, implants, gum treatment, root canals, and extractions. Preventive care is also provided. Treatment is provided to both adults and children. °Patients are selected via a lottery and there is often a waiting list. °  °[facility] [address], °[place] ° (336) 763-8833 [email or web address].[email or web address] °  °[facility] [address], [place], [place] (336)723-1848, Ext. 123 Second and Fourth Thursday of each month, opens at 6:30 AM; Clinic ends at 9 AM.  Patients are seen on a first-come first-served basis, and a limited number are seen during each clinic.  ° °[facility] ° [address], [place], [place] (336) 723-7904    Eligibility Requirements °You must have lived in [place], [place], or [place] counties for at least the last three months. °  You cannot be eligible for state or federal sponsored healthcare insurance, including Veterans Administration, Medicaid, or Medicare. °  You generally cannot be eligible for healthcare insurance through your employer.  °  How to apply: °Eligibility screenings are held every Tuesday and Wednesday afternoon from 1:00 pm until 4:00 pm. You do not need an appointment for the interview!  °[facility] [address], [place], [place] 336-631-2330   °[facility]  336-342-8273   °[facility]  336-703-3100   °[facility]  336-570-6415   ° °Behavioral Health Resources in the Community: °Intensive Outpatient Programs °Organization         Address  Phone  Notes  °[facility] [address], [place], [place] 336-878-6098   °[facility] [address], [place], [place] 336-832-9800   °[facility] [address], [place], [place] ° 336-882-2125   °[facility] [address],  °[place], [place] 1-800-853-5163 or 336-641-4981   °Substance Abuse Resources °Organization         Address  Phone  Notes  °[facility]  336-882-2125   °[facility]  336-784-9470   °[facility]  336-285-9073   °[facility]  336-845-3988   °Residential & Outpatient Substance Abuse Program  1-800-659-3381   °Psychological Services °Organization         Address  Phone  Notes  °[facility]  336- 832-9600   °[facility]  336- 378-7881   °[facility] [address], [place] 1-800-853-5163 or 336-641-4981   ° °Mobile Crisis Teams °Organization         Address  Phone  Notes  °[facility], Mobile Crisis Care Unit  1-877-626-1772   °Assertive °[facility] ° [address]  [place], [place] 336-834-9664   °Sharon DeEsch [address] °[place] 336-554-5454   ° °Self-Help/Support Groups °Organization         Address  Phone             Notes  °[facility]  - variety of support groups  336- 373-1402 Call for more information  °Narcotics Anonymous (NA), [facility] [address], °[place]  2 meetings at this location  ° °  Residential Treatment Programs Organization         Address  Phone  Notes  [facility] [address]    [place]  1-224-825-0037   [facility]  [address] [place] [address], [place], [place] 169-450-3888   [facility] [address] [place], [place] 280-034-9179 Admissions: 8am-3pm M-F  [facility] [address],    [place], [place] 150-569-7948   [facility] [address] [place], [place], [place] 016-553-7482   [facility] [address]  [place], [place] 707-867-5449   [facility] Intensive Outpatient [address], [place], [place] 201-007-1219   [facility] [address]  [place], [place] 7-588-325-4982 or 940-364-9652   [facility] [address], [place], [place] 768-088-1103 Accepts Medicaid  [facility] [address],  [place] 1-594-585-9292 Substance Abuse/Addiction Treatment   [facility]         Address  Phone  Notes  [facility]  5796165876   Angie Fava, PhD [address] [place], [place]   902-133-3144 or 423-710-1837   [facility]   [address] [place], [place] (585)053-9867   [facility] [address], [place], [place] 732-405-3821 Insurance/Medicaid/sponsorship through [facility] [address]                                    [place], [place] [phone] Therapy/tele-psych/case    [facility] [address], [place] [phone]    Dr. Lolly Mustache  [phone]   [facility]  [employer] [address] 1) [address], [place] 2) [address] [place] 3)  [address], [place] 850-056-6896 7758398456  [phone]   [facility] Child Abuse Hotline 513-241-3962 or 409-361-3670 (After Hours)      Discontinue use of listerine or mouthwash for 1 week. Also do not use hydrogen peroxide in your mouth ever. After 1 week you may start using mouthwash again, but please switch to a non-alcoholic type such as ACT. You may take ibuprofen or tylenol as needed for pain.

## 2013-12-21 NOTE — ED Notes (Signed)
Dr. Harrison at the bedside. 

## 2013-12-21 NOTE — ED Notes (Signed)
The pt has had a swollen tongue for 2-3 days.  She also has a burning sensation on her tongue.  No difficulty swallowing

## 2013-12-21 NOTE — ED Provider Notes (Signed)
CSN: [id number]     Arrival date & time 12/21/13  0701 History   First MD Initiated Contact with Patient 12/21/13 [phone]     Chief Complaint  Patient presents with  . Oral Swelling   (Consider location/radiation/quality/duration/timing/severity/associated sxs/prior Treatment) Patient is a 53 y.o. female presenting with mouth sores. The history is provided by the patient.  Mouth Lesions Location:  Tongue Description of oral lesions: burning sensation to tongue. Onset quality:  Gradual Severity:  Mild Duration:  4 days Progression:  Unchanged Chronicity:  New Relieved by:  Nothing Exacerbated by: mouthwash. Ineffective treatments:  None tried Associated symptoms: no congestion, no dental pain, no fever, no neck pain, no sore throat and no swollen glands     Past Medical History  Diagnosis Date  . Other left bundle branch block   . Automatic implantable cardiac defibrillator in situ     a. Aug 18, 2010 s/p  s/p SJM 3231-40 Bi V ICD Ser # [id number].  Marland Kitchen Chronic systolic heart failure   . Anemia, unspecified   . Paroxysmal supraventricular tachycardia   . Nonischemic cardiomyopathy     a. 18-Aug-2010 OOH VF arrest;  b. 08/18/10 Cath: nonobs dzs, EF 15-20%, glob HK;  c. 18-Aug-2010 Echo: EF 15%, diff HK, Gr 2 DD;  d. 2010/08/18 Cardiac MRI EF of 29%, no scar;  e. Aug 18, 2010 s/p SJM 3231-40 Bi V ICD Ser # [id number].  Marland Kitchen Atrial fibrillation     a. This occurred in setting of resuscitation efforts during VF arrest.  . Cardiac arrest     a. 2010/08/18  . History of tobacco abuse   . History of cocaine abuse     remote  . Thyroid disease   . SUBSTANCE ABUSE, MULTIPLE 08/13/2010    Qualifier: Diagnosis of  By: Denyse Amass CMA, Carol    . Hypertension    Past Surgical History  Procedure Laterality Date  . Cardiac defibrillator placement      [facility] [place] CD [id number] Q defib, serial number [id number]  . Cardiac defibrillator placement     Family History  Problem Relation Age of Onset  . Heart disease Father    History   Substance Use Topics  . Smoking status: Former Smoker    Quit date: 07/28/2011  . Smokeless tobacco: Never Used     Comment: quit 2 weeks ago  . Alcohol Use: No   OB History   Grav Para Term Preterm Abortions TAB SAB Ect Mult Living   4 4 4       4      Review of Systems  Constitutional: Negative for fever and fatigue.  HENT: Positive for mouth sores. Negative for congestion, drooling and sore throat.   Eyes: Negative for pain.  Respiratory: Negative for cough and shortness of breath.   Cardiovascular: Negative for chest pain.  Gastrointestinal: Negative for nausea, vomiting, abdominal pain and diarrhea.  Genitourinary: Negative for dysuria and hematuria.  Musculoskeletal: Positive for back pain. Negative for gait problem and neck pain.  Skin: Negative for color change.  Neurological: Negative for dizziness and headaches.  Hematological: Negative for adenopathy.  Psychiatric/Behavioral: Negative for behavioral problems.  All other systems reviewed and are negative.    Allergies  Review of patient's allergies indicates no known allergies.  Home Medications   Current Outpatient Rx  Name  Route  Sig  Dispense  Refill  . albuterol (PROVENTIL HFA;VENTOLIN HFA) 108 (90 BASE) MCG/ACT inhaler   Inhalation   Inhale 2 puffs into the lungs every 4 (  four) hours as needed. For shortness of breath or wheezing         . aspirin 81 MG chewable tablet   Oral   Chew 81 mg by mouth daily.         . carvedilol (COREG) 25 MG tablet   Oral   Take 0.5 tablets (12.5 mg total) by mouth daily.   30 tablet   4   . diphenhydrAMINE (BENADRYL) 25 MG tablet   Oral   Take 1 tablet (25 mg total) by mouth every 6 (six) hours as needed for itching (Rash).   30 tablet   0   . furosemide (LASIX) 40 MG tablet   Oral   Take 1 tablet (40 mg total) by mouth daily.   30 tablet   3   . HYDROcodone-acetaminophen (NORCO/VICODIN) 5-325 MG per tablet   Oral   Take 2 tablets by mouth every 4  (four) hours as needed for pain.   10 tablet   0   . hydrocortisone cream 1 %   Topical   Apply topically 2 (two) times daily. Do not apply to face   15 g   1   . isosorbide-hydrALAZINE (BIDIL) 20-37.5 MG per tablet   Oral   Take 1 tablet by mouth 2 (two) times daily.   180 tablet   3   . lisinopril (PRINIVIL,ZESTRIL) 5 MG tablet      take 1 tablet by mouth once daily   30 tablet   1   . Multiple Vitamin (MULTIVITAMIN WITH MINERALS) TABS   Oral   Take 1 tablet by mouth daily.         . polyethylene glycol (MIRALAX / GLYCOLAX) packet   Oral   Take 17 g by mouth daily as needed (constipation).          . potassium chloride SA (K-DUR,KLOR-CON) 20 MEQ tablet      take 1 tablet by mouth twice a day   60 tablet   0   . promethazine (PHENERGAN) 25 MG tablet   Oral   Take 25 mg by mouth every 8 (eight) hours as needed. For nausea         . ranitidine (ZANTAC) 150 MG capsule   Oral   Take 1 capsule (150 mg total) by mouth daily.   15 capsule   0   . traMADol (ULTRAM) 50 MG tablet   Oral   Take 50 mg by mouth every 6 (six) hours as needed. FOR PAIN          BP 153/100  Temp(Src) 97.7 F (36.5 C) (Oral)  Resp 18  SpO2 100% Physical Exam  Nursing note and vitals reviewed. Constitutional: She is oriented to person, place, and time. She appears well-developed and well-nourished.  HENT:  Head: Normocephalic.  Mouth/Throat: Oropharynx is clear and moist. No oropharyngeal exudate.  Normal appearing posterior oropharynx.  Normal appearing dentition and buccal mucosa.  Normal appearing tongue w/out swelling.   Eyes: Conjunctivae and EOM are normal. Pupils are equal, round, and reactive to light.  Neck: Normal range of motion. Neck supple.  Normal palpation of neck.   Cardiovascular: Normal rate, regular rhythm, normal heart sounds and intact distal pulses.  Exam reveals no gallop and no friction rub.   No murmur heard. Pulmonary/Chest: Effort normal and  breath sounds normal. No respiratory distress. She has no wheezes.  Abdominal: Soft. Bowel sounds are normal. There is no tenderness. There is no rebound and no guarding.  Musculoskeletal: Normal range of motion. She exhibits no edema and no tenderness.  Neurological: She is alert and oriented to person, place, and time.  Skin: Skin is warm and dry.  Psychiatric: She has a normal mood and affect. Her behavior is normal.    ED Course  Procedures (including critical care time) Labs Review Labs Reviewed - No data to display Imaging Review No results found.  EKG Interpretation   None       MDM   1. Tongue burning sensation    7:24 AM 53 y.o. female pw burning tongue sensation x 4 days. Pt recently started using hydrogen peroxide as a mouth rinse. She also notes worsening pain when using her listerine. She denies any other associated symptoms and her vital signs are unremarkable here. I suspect her symptoms are associated with rinsing her mouth out with hydrogen peroxide. Will recommend that she discontinue mouthwash for approximately one week. I recommended that she use an alternative form of mouthwash that is alcohol free at that time if she wishes to continue using mouth wash. Will have her discontinue using hydrogen peroxide. I recommended that she follow up with her dentist if no better in one week. She was seen several days ago at an urgent care for back pain and had the burning sensation at that time. She was prescribed Magic mouthwash and I recommended she use this as needed for oral pain.  7:33 AM:  I have discussed the diagnosis/risks/treatment options with the patient and believe the pt to be eligible for discharge home to follow-up with her dentist if no better in 1 week. We also discussed returning to the ED immediately if new or worsening sx occur. We discussed the sx which are most concerning (e.g., worsening pain, fever) that necessitate immediate return. Any new prescriptions  provided to the patient are listed below.  New Prescriptions   No medications on file       Junius ArgyleForrest S Anaston Koehn, MD 12/21/13 [id number]

## 2014-01-01 ENCOUNTER — Other Ambulatory Visit: Payer: Self-pay | Admitting: Cardiology

## 2014-01-07 ENCOUNTER — Emergency Department (HOSPITAL_COMMUNITY): Payer: Medicaid Other

## 2014-01-07 ENCOUNTER — Encounter (HOSPITAL_COMMUNITY): Payer: Self-pay | Admitting: Emergency Medicine

## 2014-01-07 ENCOUNTER — Emergency Department (HOSPITAL_COMMUNITY)
Admission: EM | Admit: 2014-01-07 | Discharge: 2014-01-07 | Disposition: A | Discharge status: Home / Self Care | Payer: Medicaid Other | Attending: Emergency Medicine | Admitting: Emergency Medicine

## 2014-01-07 DIAGNOSIS — I10 Essential (primary) hypertension: Secondary | ICD-10-CM | POA: Insufficient documentation

## 2014-01-07 DIAGNOSIS — I5022 Chronic systolic (congestive) heart failure: Secondary | ICD-10-CM | POA: Insufficient documentation

## 2014-01-07 DIAGNOSIS — E079 Disorder of thyroid, unspecified: Secondary | ICD-10-CM | POA: Insufficient documentation

## 2014-01-07 DIAGNOSIS — Z87891 Personal history of nicotine dependence: Secondary | ICD-10-CM | POA: Insufficient documentation

## 2014-01-07 DIAGNOSIS — K1370 Unspecified lesions of oral mucosa: Secondary | ICD-10-CM | POA: Insufficient documentation

## 2014-01-07 DIAGNOSIS — G609 Hereditary and idiopathic neuropathy, unspecified: Secondary | ICD-10-CM | POA: Insufficient documentation

## 2014-01-07 DIAGNOSIS — G629 Polyneuropathy, unspecified: Secondary | ICD-10-CM

## 2014-01-07 DIAGNOSIS — Z79899 Other long term (current) drug therapy: Secondary | ICD-10-CM | POA: Insufficient documentation

## 2014-01-07 DIAGNOSIS — IMO0002 Reserved for concepts with insufficient information to code with codable children: Secondary | ICD-10-CM | POA: Insufficient documentation

## 2014-01-07 DIAGNOSIS — I4891 Unspecified atrial fibrillation: Secondary | ICD-10-CM | POA: Insufficient documentation

## 2014-01-07 DIAGNOSIS — I471 Supraventricular tachycardia, unspecified: Secondary | ICD-10-CM | POA: Insufficient documentation

## 2014-01-07 DIAGNOSIS — Z7982 Long term (current) use of aspirin: Secondary | ICD-10-CM | POA: Insufficient documentation

## 2014-01-07 DIAGNOSIS — Z95810 Presence of automatic (implantable) cardiac defibrillator: Secondary | ICD-10-CM | POA: Insufficient documentation

## 2014-01-07 DIAGNOSIS — Z862 Personal history of diseases of the blood and blood-forming organs and certain disorders involving the immune mechanism: Secondary | ICD-10-CM | POA: Insufficient documentation

## 2014-01-07 LAB — COMPREHENSIVE METABOLIC PANEL
ALT: 11 U/L (ref 0–35)
AST: 17 U/L (ref 0–37)
Albumin: 2.9 g/dL — ABNORMAL LOW (ref 3.5–5.2)
Alkaline Phosphatase: 65 U/L (ref 39–117)
BILIRUBIN TOTAL: 0.2 mg/dL — AB (ref 0.3–1.2)
BUN: 17 mg/dL (ref 6–23)
CHLORIDE: 111 meq/L (ref 96–112)
CO2: 21 meq/L (ref 19–32)
CREATININE: 1.01 mg/dL (ref 0.50–1.10)
Calcium: 8.4 mg/dL (ref 8.4–10.5)
GFR, EST AFRICAN AMERICAN: 73 mL/min — AB (ref >90)
GFR, EST NON AFRICAN AMERICAN: 63 mL/min — AB (ref >90)
GLUCOSE: 89 mg/dL (ref 70–99)
Potassium: 4.1 mEq/L (ref 3.7–5.3)
Sodium: 145 mEq/L (ref 137–147)
Total Protein: 6.4 g/dL (ref 6.0–8.3)

## 2014-01-07 LAB — DIFFERENTIAL
BASOS ABS: 0 10*3/uL (ref 0.0–0.1)
Basophils Relative: 0 % (ref 0–1)
Eosinophils Absolute: 0.2 10*3/uL (ref 0.0–0.7)
Eosinophils Relative: 2 % (ref 0–5)
LYMPHS ABS: 1.6 10*3/uL (ref 0.7–4.0)
LYMPHS PCT: 23 % (ref 12–46)
MONO ABS: 0.6 10*3/uL (ref 0.1–1.0)
MONOS PCT: 9 % (ref 3–12)
NEUTROS ABS: 4.8 10*3/uL (ref 1.7–7.7)
Neutrophils Relative %: 67 % (ref 43–77)

## 2014-01-07 LAB — CBC
HCT: 35.8 % — ABNORMAL LOW (ref 36.0–46.0)
HEMOGLOBIN: 11.4 g/dL — AB (ref 12.0–15.0)
MCH: 26.3 pg (ref 26.0–34.0)
MCHC: 31.8 g/dL (ref 30.0–36.0)
MCV: 82.5 fL (ref 78.0–100.0)
Platelets: 290 10*3/uL (ref 150–400)
RBC: 4.34 MIL/uL (ref 3.87–5.11)
RDW: 15.4 % (ref 11.5–15.5)
WBC: 7.2 10*3/uL (ref 4.0–10.5)

## 2014-01-07 LAB — RAPID URINE DRUG SCREEN, HOSP PERFORMED
Amphetamines: NOT DETECTED
Barbiturates: NOT DETECTED
Benzodiazepines: NOT DETECTED
Cocaine: POSITIVE — AB
OPIATES: NOT DETECTED
TETRAHYDROCANNABINOL: NOT DETECTED

## 2014-01-07 LAB — POCT I-STAT TROPONIN I: Troponin i, poc: 0 ng/mL (ref 0.00–0.08)

## 2014-01-07 LAB — TROPONIN I: Troponin I: <0.3 ng/mL (ref <0.30)

## 2014-01-07 LAB — PROTIME-INR
INR: 0.99 (ref 0.00–1.49)
Prothrombin Time: 12.9 seconds (ref 11.6–15.2)

## 2014-01-07 LAB — APTT: APTT: 30 s (ref 24–37)

## 2014-01-07 MED ORDER — TRAMADOL HCL 50 MG PO TABS: 50.0000 mg | ORAL_TABLET | Freq: Four times a day (QID) | ORAL | Status: AC | PRN

## 2014-01-07 MED ORDER — HYDROCODONE-ACETAMINOPHEN 5-325 MG PO TABS
1.0000 | ORAL_TABLET | Freq: Once | ORAL | Status: AC
  Administered 2014-01-07: 1 via ORAL
  Filled 2014-01-07: qty 1

## 2014-01-07 NOTE — Discharge Instructions (Signed)
Pinched Nerve The term pinched nerve describes one type of damage or injury to a nerve or set of nerves. Pinched nerves can sometimes lead to other conditions. These include peripheral neuropathy, carpal tunnel syndrome, and tennis elbow. The extent of such injuries may vary from minor, temporary damage to a more permanent condition. Early diagnosis is important to prevent further damage or complications. Pinched nerve is a common cause of on-the-job injury. CAUSES  The injury may result from:  Compression.  Constriction.  Stretching. SYMPTOMS  Symptoms include:  Numbness.  "Pins and needles" or burning sensations.  Pain radiating outward from the injured area.  One of the most common examples of a single compressed nerve is the feeling of having a foot or hand "fall asleep." TREATMENT  The most often recommended treatment for pinched nerve is rest for the affected area. Corticosteroids help alleviate pain. In some cases, surgery is recommended. Physical therapy may be recommended. Splints or collars may be used. With treatment, most people recover from pinched nerve. In some cases, the damage is irreversible. Document Released: 11/06/2002 Document Revised: 02/08/2012 Document Reviewed: 10/24/2008 [facility] Patient Information 2014 [place], [place].

## 2014-01-07 NOTE — ED Notes (Signed)
Pt. Stated, My right arm has been numb since Friday,. Denies any other symptoms.

## 2014-01-07 NOTE — ED Provider Notes (Signed)
CSN: [id number]     Arrival date & time 01/07/14  4098 History   First MD Initiated Contact with Patient 01/07/14 [phone]     Chief Complaint  Patient presents with  . Numbness    HPI Patient complains of numbness and weakness in her right arm since Friday.  Patient states she feels like her. arm has been asleep as if she slept on it wrong.  This has happened to her in the past and she thought it would just go away but it has persisted. The patient also feels an aching-type discomfort in her right arm. She feels like the entire arm is numb although it is primarily in the hand and fingers but it does extend proximally. She denies any recent injuries. She denies any chest pain or shortness of breath. She denies any trouble with her speech or coordination. Past Medical History  Diagnosis Date  . Other left bundle branch block   . Automatic implantable cardiac defibrillator in situ     a. 06/2010 s/p  s/p SJM 3231-40 Bi V ICD Ser # [id number].  Marland Kitchen Chronic systolic heart failure   . Anemia, unspecified   . Paroxysmal supraventricular tachycardia   . Nonischemic cardiomyopathy     a. 06/2010 OOH VF arrest;  b. 06/2010 Cath: nonobs dzs, EF 15-20%, glob HK;  c. 06/2010 Echo: EF 15%, diff HK, Gr 2 DD;  d. 06/2010 Cardiac MRI EF of 29%, no scar;  e. 06/2010 s/p SJM 3231-40 Bi V ICD Ser # [id number].  Marland Kitchen Atrial fibrillation     a. This occurred in setting of resuscitation efforts during VF arrest.  . Cardiac arrest     a. 06/2010  . History of tobacco abuse   . History of cocaine abuse     remote  . Thyroid disease   . SUBSTANCE ABUSE, MULTIPLE 08/13/2010    Qualifier: Diagnosis of  By: Denyse Amass CMA, Carol    . Hypertension    Past Surgical History  Procedure Laterality Date  . Cardiac defibrillator placement      [facility] [place] CD [id number] Q defib, serial number [id number]  . Cardiac defibrillator placement     Family History  Problem Relation Age of Onset  . Heart disease Father    History  Substance Use Topics  .  Smoking status: Former Smoker    Quit date: 07/28/2011  . Smokeless tobacco: Never Used     Comment: quit 2 weeks ago  . Alcohol Use: No   OB History   Grav Para Term Preterm Abortions TAB SAB Ect Mult Living   4 4 4       4      Review of Systems  Constitutional: Negative for fever.  HENT:       The patient has noticed some oral sores in her mouth, this has been ongoing for several weeks  All other systems reviewed and are negative.    Allergies  Review of patient's allergies indicates no known allergies.  Home Medications   Current Outpatient Rx  Name  Route  Sig  Dispense  Refill  . albuterol (PROVENTIL HFA;VENTOLIN HFA) 108 (90 BASE) MCG/ACT inhaler   Inhalation   Inhale 2 puffs into the lungs every 4 (four) hours as needed. For shortness of breath or wheezing         . aspirin 81 MG chewable tablet   Oral   Chew 81 mg by mouth daily.         . carvedilol (  COREG) 25 MG tablet   Oral   Take 12.5 mg by mouth 2 (two) times daily with a meal.         . diphenhydrAMINE (BENADRYL) 25 MG tablet   Oral   Take 1 tablet (25 mg total) by mouth every 6 (six) hours as needed for itching (Rash).   30 tablet   0   . furosemide (LASIX) 40 MG tablet   Oral   Take 1 tablet (40 mg total) by mouth daily.   30 tablet   3   . hydrocortisone cream 1 %   Topical   Apply topically 2 (two) times daily. Do not apply to face   15 g   1   . isosorbide-hydrALAZINE (BIDIL) 20-37.5 MG per tablet   Oral   Take 1 tablet by mouth 2 (two) times daily.   180 tablet   3   . lisinopril (PRINIVIL,ZESTRIL) 5 MG tablet      take 1 tablet by mouth once daily   30 tablet   1   . Multiple Vitamin (MULTIVITAMIN WITH MINERALS) TABS   Oral   Take 1 tablet by mouth daily.         . polyethylene glycol (MIRALAX / GLYCOLAX) packet   Oral   Take 17 g by mouth daily as needed (constipation).          . potassium chloride SA (K-DUR,KLOR-CON) 20 MEQ tablet      take 1 tablet by  mouth twice a day   60 tablet   0   . promethazine (PHENERGAN) 25 MG tablet   Oral   Take 25 mg by mouth every 8 (eight) hours as needed. For nausea         . ranitidine (ZANTAC) 150 MG tablet   Oral   Take 150 mg by mouth daily as needed for heartburn.         . traMADol (ULTRAM) 50 MG tablet   Oral   Take 1 tablet (50 mg total) by mouth every 6 (six) hours as needed. FOR PAIN   30 tablet   0    BP 140/92  Pulse 70  Temp(Src) 97.3 F (36.3 C) (Oral)  Resp 18  SpO2 99% Physical Exam  Nursing note and vitals reviewed. Constitutional: She is oriented to person, place, and time. She appears well-developed and well-nourished. No distress.  HENT:  Head: Normocephalic and atraumatic.  Right Ear: External ear normal.  Left Ear: External ear normal.  Mouth/Throat: Oropharynx is clear and moist.  Eyes: Conjunctivae are normal. Right eye exhibits no discharge. Left eye exhibits no discharge. No scleral icterus.  Neck: Neck supple. No tracheal deviation present.  Cardiovascular: Normal rate, regular rhythm and intact distal pulses.   Pulmonary/Chest: Effort normal and breath sounds normal. No stridor. No respiratory distress. She has no wheezes. She has no rales.  Abdominal: Soft. Bowel sounds are normal. She exhibits no distension. There is no tenderness. There is no rebound and no guarding.  Musculoskeletal: She exhibits no edema and no tenderness.  Neurological: She is alert and oriented to person, place, and time. No cranial nerve deficit (no facial droop, extraocular movements intact, no slurred speech). She exhibits normal muscle tone. She displays no seizure activity. Coordination normal.  No pronator drift bilateral upper extrem, able to hold both legs off bed for 5 seconds, sensation intact in all extremities, no visual field cuts, no left or right sided neglect, normal finger-nose exam bilaterally, no nystagmus noted,  sensation is intact in all extremities although pt  states altered in right hand, ?decreased grip strength right hand   Skin: Skin is warm and dry. No rash noted.  Psychiatric: She has a normal mood and affect.    ED Course  Procedures (including critical care time) Labs Review Labs Reviewed  CBC - Abnormal; Notable for the following:    Hemoglobin 11.4 (*)    HCT 35.8 (*)    All other components within normal limits  COMPREHENSIVE METABOLIC PANEL - Abnormal; Notable for the following:    Albumin 2.9 (*)    Total Bilirubin 0.2 (*)    GFR calc non Af Amer 63 (*)    GFR calc Af Amer 73 (*)    All other components within normal limits  URINE RAPID DRUG SCREEN (HOSP PERFORMED) - Abnormal; Notable for the following:    Cocaine POSITIVE (*)    All other components within normal limits  PROTIME-INR  APTT  DIFFERENTIAL  TROPONIN I  POCT I-STAT TROPONIN I   Imaging Review No results found.  EKG Interpretation    Date/Time:  Sunday January 07 2014 07:46:37 EST Ventricular Rate:  76 PR Interval:  132 QRS Duration: 120 QT Interval:  422 QTC Calculation: 474 R Axis:   131 Text Interpretation:  Electronic ventricular pacemaker No significant change since last tracing Confirmed by Tangie Stay  MD-J, Ardeth Repetto ([id number]) on 01/07/2014 7:55:21 AM            MDM   1. Peripheral neuropathy      Possible peripheral neuropathy as the symptoms are confined to the right ue and more specifically distal to the elbow.  CT scan normal.  Pt not a candidate for MRI with her aicd.  Cannot completely exclude CVA but overall doubt     Celene Kras, MD 01/09/14 1116

## 2014-01-09 ENCOUNTER — Other Ambulatory Visit: Payer: Self-pay | Admitting: Obstetrics & Gynecology

## 2014-01-09 DIAGNOSIS — Z1239 Encounter for other screening for malignant neoplasm of breast: Secondary | ICD-10-CM

## 2014-01-09 NOTE — ED Notes (Signed)
Pharmacy called stating patient brought in a rx with the date 12/19/12 which should have been 12/19/13 since patient was seen on that day

## 2014-01-18 ENCOUNTER — Other Ambulatory Visit: Payer: Medicaid Other

## 2014-02-23 ENCOUNTER — Other Ambulatory Visit: Payer: Medicaid Other

## 2014-03-07 ENCOUNTER — Other Ambulatory Visit: Payer: Medicaid Other

## 2014-03-16 ENCOUNTER — Inpatient Hospital Stay: Admission: RE | Admit: 2014-03-16 | Payer: Medicaid Other | Source: Ambulatory Visit

## 2014-03-16 ENCOUNTER — Other Ambulatory Visit: Payer: Medicaid Other

## 2014-03-27 ENCOUNTER — Encounter (HOSPITAL_COMMUNITY): Payer: Self-pay | Admitting: *Deleted

## 2014-03-27 ENCOUNTER — Inpatient Hospital Stay (HOSPITAL_COMMUNITY)
Admission: AD | Admit: 2014-03-27 | Discharge: 2014-03-27 | Disposition: A | Discharge status: Home / Self Care | Payer: Medicaid Other | Source: Ambulatory Visit | Attending: Obstetrics & Gynecology | Admitting: Obstetrics & Gynecology

## 2014-03-27 DIAGNOSIS — N898 Other specified noninflammatory disorders of vagina: Secondary | ICD-10-CM | POA: Insufficient documentation

## 2014-03-27 DIAGNOSIS — I447 Left bundle-branch block, unspecified: Secondary | ICD-10-CM | POA: Insufficient documentation

## 2014-03-27 DIAGNOSIS — Z87891 Personal history of nicotine dependence: Secondary | ICD-10-CM | POA: Insufficient documentation

## 2014-03-27 DIAGNOSIS — I5022 Chronic systolic (congestive) heart failure: Secondary | ICD-10-CM | POA: Insufficient documentation

## 2014-03-27 DIAGNOSIS — N390 Urinary tract infection, site not specified: Secondary | ICD-10-CM | POA: Insufficient documentation

## 2014-03-27 HISTORY — DX: Major depressive disorder, single episode, unspecified: F32.9

## 2014-03-27 HISTORY — DX: Depression, unspecified: F32.A

## 2014-03-27 LAB — URINALYSIS, ROUTINE W REFLEX MICROSCOPIC
Bilirubin Urine: NEGATIVE
Glucose, UA: NEGATIVE mg/dL
Hgb urine dipstick: NEGATIVE
KETONES UR: NEGATIVE mg/dL
Leukocytes, UA: NEGATIVE
NITRITE: NEGATIVE
PH: 6 (ref 5.0–8.0)
Protein, ur: NEGATIVE mg/dL
Specific Gravity, Urine: 1.025 (ref 1.005–1.030)
Urobilinogen, UA: 1 mg/dL (ref 0.0–1.0)

## 2014-03-27 LAB — POCT PREGNANCY, URINE: Preg Test, Ur: NEGATIVE

## 2014-03-27 LAB — WET PREP, GENITAL
Clue Cells Wet Prep HPF POC: NONE SEEN
Trich, Wet Prep: NONE SEEN
Yeast Wet Prep HPF POC: NONE SEEN

## 2014-03-27 NOTE — MAU Note (Addendum)
Vag discharge, runny, started about 2 days ago.  Also has a rash, upper rt  Shoulder/neck and left upper chest- also first noted 2 days ago; is itchy.

## 2014-03-27 NOTE — MAU Provider Note (Signed)
Chief Complaint:  Vaginal Discharge and Rash  @MAUPATCONTACT @  HPI: Angela Payne DecemberHogue is a 53 y.o. [id number] at Unknown who presents to maternity admissions complaining of vaginal discharge x3 days and rash x2 days.   Vaginal Discharge: Pt noticed a "rusty" colored "runny" vaginal discharge that began 2-3 days ago. Has not experienced symptoms like this in years since her last yeast infection. Pt has an IUD x3 years so unsure of LMP. Pt has been unable to palpate IUD strings and states "they cut them too short".  At time of implantation her periods were getting "lighter". Patient is sexually active. Denies dysuria, flank pain, hematuria, fever, n/v, malaise, abdominal pain  Rash: Pt describes a rash on her right shoulder and left upper chest that began 3 days ago after something crawled into her shirt and bit her on the shoulder while she was helping move some objects at a friends apartment. Pt did not see what bit her but felt 3-5 discrete bites. Pt began itching the area of the bite and says that it has made the rash worse. She now says it only itches once she begins to scratch. Pt has tried hydrocortisone cream which relieve itching.The rash does not cause her any discomfort otherwise. Denies fever, n/v, malaise, chills, night sweats.   Pregnancy Course:   Past Medical History: Past Medical History  Diagnosis Date  . Other left bundle branch block   . Automatic implantable cardiac defibrillator in situ     a. 06/2010 s/p  s/p SJM 3231-40 Bi V ICD Ser # [id number].  Marland Kitchen. Chronic systolic heart failure   . Anemia, unspecified   . Paroxysmal supraventricular tachycardia   . Nonischemic cardiomyopathy     a. 06/2010 OOH VF arrest;  b. 06/2010 Cath: nonobs dzs, EF 15-20%, glob HK;  c. 06/2010 Echo: EF 15%, diff HK, Gr 2 DD;  d. 06/2010 Cardiac MRI EF of 29%, no scar;  e. 06/2010 s/p SJM 3231-40 Bi V ICD Ser # [id number].  Marland Kitchen. Atrial fibrillation     a. This occurred in setting of resuscitation efforts during VF  arrest.  . Cardiac arrest     a. 06/2010  . History of tobacco abuse   . History of cocaine abuse     remote  . Thyroid disease   . SUBSTANCE ABUSE, MULTIPLE 08/13/2010    Qualifier: Diagnosis of  By: Denyse AmassFiato, CMA, Carol    . Hypertension   . Depression     ok    Past obstetric history: OB History  Gravida Para Term Preterm AB SAB TAB Ectopic Multiple Living  4 4 4       4     # Outcome Date GA Lbr Len/2nd Weight Sex Delivery Anes PTL Lv  4 TRM           3 TRM           2 TRM           1 TRM               Past Surgical History: Past Surgical History  Procedure Laterality Date  . Cardiac [facility]illator placement      [id number], serial number [id number]  . Cardiac defibrillator placement      Family History: Family History  Problem Relation Age of Onset  . Heart disease Father   . Hypertension Father   . Hypertension Mother   . Heart disease Mother     Social History:  History  Substance Use Topics  . Smoking status: Former Smoker    Quit date: 07/28/2011  . Smokeless tobacco: Never Used  . Alcohol Use: No    Allergies: No Known Allergies  Meds:  Prescriptions prior to admission  Medication Sig Dispense Refill  . aspirin 81 MG chewable tablet Chew 81 mg by mouth daily.      . carvedilol (COREG) 25 MG tablet Take 12.5 mg by mouth 2 (two) times daily with a meal.      . diphenhydrAMINE (BENADRYL) 25 MG tablet Take 1 tablet (25 mg total) by mouth every 6 (six) hours as needed for itching (Rash).  30 tablet  0  . furosemide (LASIX) 40 MG tablet Take 1 tablet (40 mg total) by mouth daily.  30 tablet  3  . hydrocortisone cream 1 % Apply 1 application topically 2 (two) times daily as needed for itching.      Marland Kitchen ibuprofen (ADVIL,MOTRIN) 200 MG tablet Take 200 mg by mouth every 6 (six) hours as needed for mild pain.      . isosorbide-hydrALAZINE (BIDIL) 20-37.5 MG per tablet Take 1 tablet by mouth 2 (two) times daily.  180 tablet  3  . lisinopril  (PRINIVIL,ZESTRIL) 5 MG tablet take 1 tablet by mouth once daily  30 tablet  1  . Multiple Vitamin (MULTIVITAMIN WITH MINERALS) TABS Take 1 tablet by mouth daily.      . polyethylene glycol (MIRALAX / GLYCOLAX) packet Take 17 g by mouth daily as needed (constipation).       . potassium chloride SA (K-DUR,KLOR-CON) 20 MEQ tablet take 1 tablet by mouth twice a day  60 tablet  0  . promethazine (PHENERGAN) 25 MG tablet Take 25 mg by mouth every 8 (eight) hours as needed. For nausea      . ranitidine (ZANTAC) 150 MG tablet Take 150 mg by mouth daily as needed for heartburn.      . traMADol (ULTRAM) 50 MG tablet Take 1 tablet (50 mg total) by mouth every 6 (six) hours as needed. FOR PAIN  30 tablet  0  . albuterol (PROVENTIL HFA;VENTOLIN HFA) 108 (90 BASE) MCG/ACT inhaler Inhale 2 puffs into the lungs every 4 (four) hours as needed. For shortness of breath or wheezing      . hydrocortisone cream 1 % Apply topically 2 (two) times daily as needed for itching. Do not apply to face      . [DISCONTINUED] hydrocortisone cream 1 % Apply topically 2 (two) times daily. Do not apply to face  15 g  1   Results for orders placed during the hospital encounter of 03/27/14 (from the past 48 hour(s))  URINALYSIS, ROUTINE W REFLEX MICROSCOPIC     Status: None   Collection Time    03/27/14  1:43 PM      Result Value Ref Range   Color, Urine YELLOW  YELLOW   APPearance CLEAR  CLEAR   Specific Gravity, Urine 1.025  1.005 - 1.030   pH 6.0  5.0 - 8.0   Glucose, UA NEGATIVE  NEGATIVE mg/dL   Hgb urine dipstick NEGATIVE  NEGATIVE   Bilirubin Urine NEGATIVE  NEGATIVE   Ketones, ur NEGATIVE  NEGATIVE mg/dL   Protein, ur NEGATIVE  NEGATIVE mg/dL   Urobilinogen, UA 1.0  0.0 - 1.0 mg/dL   Nitrite NEGATIVE  NEGATIVE   Leukocytes, UA NEGATIVE  NEGATIVE   Comment: MICROSCOPIC NOT DONE ON URINES WITH NEGATIVE PROTEIN, BLOOD, LEUKOCYTES, NITRITE, OR GLUCOSE <  1000 mg/dL.  POCT PREGNANCY, URINE     Status: None   Collection  Time    03/27/14  1:52 PM      Result Value Ref Range   Preg Test, Ur NEGATIVE  NEGATIVE   Comment:            THE SENSITIVITY OF THIS     METHODOLOGY IS >24 mIU/mL  WET PREP, GENITAL     Status: Abnormal   Collection Time    03/27/14  2:10 PM      Result Value Ref Range   Yeast Wet Prep HPF POC NONE SEEN  NONE SEEN   Trich, Wet Prep NONE SEEN  NONE SEEN   Clue Cells Wet Prep HPF POC NONE SEEN  NONE SEEN   WBC, Wet Prep HPF POC FEW (*) NONE SEEN   Comment: MODERATE BACTERIA SEEN    ROS: Pertinent findings in history of present illness.  Physical Exam  Blood pressure 134/87, pulse 79, temperature 98.2 F (36.8 C), temperature source Oral, resp. rate 16, height 5\' 1"  (1.549 m), weight 67.586 kg (149 lb), SpO2 100.00%. GENERAL: Well-developed, well-nourished female in no acute distress. SKIN: small excoriated papules on right shoulder, few similar papules on left upper chest. No active drainage/bleeding/blisters  HEENT: normocephalic HEART: normal rate RESP: normal effort ABDOMEN: Soft, non-tender,  EXTREMITIES: Nontender, no edema NEURO: alert and oriented  Speculum exam: Vagina - Small amount of creamy discharge, no odor Cervix - No contact bleeding Bimanual exam: Cervix closed, no CMT, no IUD strings visualized or felt on exam  Uterus non tender, normal size Adnexa non tender, no masses bilaterally GC/Chlam, wet prep done Chaperone present for exam.   Labs: No results found for this or any previous visit (from the past 24 hour(s)).  Imaging:  No results found. MAU Course: Wet prep GC  Assessment and plan:  Vaginal discharge 1. UA for UTI 2. Pelvic exam, G/C cx 3. Follow-up in clinic; pt instructed to call the clinic for follow up. IUD strings not visualized or palpated.   Rash 1. Continue Hydrocortisone cream; ok to take benadryl as directed on the bottle  2. See primary care provider if fails to improve 3. Refrain from scratching to minimize risk of  infection and scarring  Pt unable to wait for culture results to come back. Pt had to leave. RN to call patient with results.     Medication List    ASK your doctor about these medications       albuterol 108 (90 BASE) MCG/ACT inhaler  Commonly known as:  PROVENTIL HFA;VENTOLIN HFA  Inhale 2 puffs into the lungs every 4 (four) hours as needed. For shortness of breath or wheezing     aspirin 81 MG chewable tablet  Chew 81 mg by mouth daily.     carvedilol 25 MG tablet  Commonly known as:  COREG  Take 12.5 mg by mouth 2 (two) times daily with a meal.     diphenhydrAMINE 25 MG tablet  Commonly known as:  BENADRYL  Take 1 tablet (25 mg total) by mouth every 6 (six) hours as needed for itching (Rash).     furosemide 40 MG tablet  Commonly known as:  LASIX  Take 1 tablet (40 mg total) by mouth daily.     hydrocortisone cream 1 %  Apply 1 application topically 2 (two) times daily as needed for itching.     hydrocortisone cream 1 %  Apply topically 2 (two) times daily  as needed for itching. Do not apply to face     ibuprofen 200 MG tablet  Commonly known as:  ADVIL,MOTRIN  Take 200 mg by mouth every 6 (six) hours as needed for mild pain.     isosorbide-hydrALAZINE 20-37.5 MG per tablet  Commonly known as:  BIDIL  Take 1 tablet by mouth 2 (two) times daily.     lisinopril 5 MG tablet  Commonly known as:  PRINIVIL,ZESTRIL  take 1 tablet by mouth once daily     multivitamin with minerals Tabs tablet  Take 1 tablet by mouth daily.     polyethylene glycol packet  Commonly known as:  MIRALAX / GLYCOLAX  Take 17 g by mouth daily as needed (constipation).     potassium chloride SA 20 MEQ tablet  Commonly known as:  K-DUR,KLOR-CON  take 1 tablet by mouth twice a day     promethazine 25 MG tablet  Commonly known as:  PHENERGAN  Take 25 mg by mouth every 8 (eight) hours as needed. For nausea     ranitidine 150 MG tablet  Commonly known as:  ZANTAC  Take 150 mg by mouth  daily as needed for heartburn.     traMADol 50 MG tablet  Commonly known as:  ULTRAM  Take 1 tablet (50 mg total) by mouth every 6 (six) hours as needed. FOR PAIN        Lawernce Pitts, Student-PA 03/27/2014 1:31 PM   Evaluation and management procedures were performed by the PA student under my supervision and collaboration. I have reviewed the note and chart, and I agree with the management and plan.  Iona Hansen Rasch, NP 03/27/2014 6:55 PM

## 2014-03-27 NOTE — MAU Provider Note (Signed)
Attestation of Attending Supervision of Advanced Practitioner (CNM/NP): Evaluation and management procedures were performed by the Advanced Practitioner under my supervision and collaboration.  I have reviewed the Advanced Practitioner's note and chart, and I agree with the management and plan.  Saajan Willmon Harraway-Smith 8:03 PM     

## 2014-03-27 NOTE — MAU Note (Signed)
Not in lobby#1 

## 2014-03-27 NOTE — MAU Note (Signed)
Reddish tan spots with pinpoint raised area - left collarbone and rt shoulder/neck area.  Pt thinks she was being bit by something

## 2014-03-28 LAB — GC/CHLAMYDIA PROBE AMP
CT Probe RNA: NEGATIVE
GC Probe RNA: NEGATIVE

## 2014-04-02 ENCOUNTER — Ambulatory Visit (INDEPENDENT_AMBULATORY_CARE_PROVIDER_SITE_OTHER): Payer: Medicaid Other | Admitting: *Deleted

## 2014-04-02 DIAGNOSIS — I428 Other cardiomyopathies: Secondary | ICD-10-CM

## 2014-04-02 DIAGNOSIS — I447 Left bundle-branch block, unspecified: Secondary | ICD-10-CM

## 2014-04-02 DIAGNOSIS — I4901 Ventricular fibrillation: Secondary | ICD-10-CM

## 2014-04-02 DIAGNOSIS — I5022 Chronic systolic (congestive) heart failure: Secondary | ICD-10-CM

## 2014-04-02 LAB — MDC_IDC_ENUM_SESS_TYPE_INCLINIC
Battery Remaining Longevity: 28.8 mo
Brady Statistic RA Percent Paced: 0.2 %
Brady Statistic RV Percent Paced: 93 %
Date Time Interrogation Session: 20150504101558
HIGH POWER IMPEDANCE MEASURED VALUE: 42.395
Implantable Pulse Generator Serial Number: 621486
Lead Channel Impedance Value: 350 Ohm
Lead Channel Impedance Value: 437.5 Ohm
Lead Channel Impedance Value: 950 Ohm
Lead Channel Pacing Threshold Amplitude: 0.5 V
Lead Channel Pacing Threshold Amplitude: 1.5 V
Lead Channel Pacing Threshold Amplitude: 1.5 V
Lead Channel Pacing Threshold Pulse Width: 0.5 ms
Lead Channel Pacing Threshold Pulse Width: 0.5 ms
Lead Channel Pacing Threshold Pulse Width: 0.8 ms
Lead Channel Pacing Threshold Pulse Width: 0.8 ms
Lead Channel Sensing Intrinsic Amplitude: 12 mV
Lead Channel Sensing Intrinsic Amplitude: 3.1 mV
Lead Channel Setting Pacing Amplitude: 1.5 V
Lead Channel Setting Pacing Amplitude: 2.5 V
Lead Channel Setting Pacing Amplitude: 3 V
Lead Channel Setting Pacing Pulse Width: 0.5 ms
Lead Channel Setting Pacing Pulse Width: 0.8 ms
Lead Channel Setting Sensing Sensitivity: 0.5 mV
MDC IDC MSMT LEADCHNL RV PACING THRESHOLD AMPLITUDE: 0.75 V
MDC IDC MSMT LEADCHNL RV PACING THRESHOLD AMPLITUDE: 0.75 V
MDC IDC MSMT LEADCHNL RV PACING THRESHOLD PULSEWIDTH: 0.5 ms
Zone Setting Detection Interval: 250 ms
Zone Setting Detection Interval: 300 ms

## 2014-04-02 NOTE — Progress Notes (Signed)
CRT-D device check in office. Thresholds and sensing consistent with previous device measurements. Lead impedance trends stable over time. 5 mode switch episodes recorded (<1%)---max dur. 1 min 26 sec, Max A 216, Max V 137---AT. No ventricular arrhythmia episodes recorded. Patient bi-ventricularly pacing 93% of the time. Device programmed with appropriate safety margins. Heart failure diagnostics reviewed and trends have been unstable for patient. Patient states she has chronic SOB/no edema. No changes made this session. Estimated longevity 2.4-2.9 years. Plan to follow up with SK in 3 months.

## 2014-04-17 ENCOUNTER — Encounter: Payer: Self-pay | Admitting: Internal Medicine

## 2014-04-24 ENCOUNTER — Other Ambulatory Visit: Payer: Self-pay | Admitting: *Deleted

## 2014-04-24 ENCOUNTER — Encounter: Payer: Self-pay | Admitting: Cardiovascular Disease

## 2014-04-24 MED ORDER — CARVEDILOL 25 MG PO TABS: 12.5000 mg | ORAL_TABLET | Freq: Two times a day (BID) | ORAL | Status: DC

## 2014-04-26 ENCOUNTER — Other Ambulatory Visit: Payer: Self-pay | Admitting: Obstetrics & Gynecology

## 2014-04-26 DIAGNOSIS — N6310 Unspecified lump in the right breast, unspecified quadrant: Secondary | ICD-10-CM

## 2014-04-30 ENCOUNTER — Emergency Department (HOSPITAL_COMMUNITY): Payer: Medicaid Other

## 2014-04-30 ENCOUNTER — Emergency Department (HOSPITAL_COMMUNITY)
Admission: EM | Admit: 2014-04-30 | Discharge: 2014-04-30 | Disposition: A | Discharge status: Home / Self Care | Payer: Medicaid Other | Attending: Emergency Medicine | Admitting: Emergency Medicine

## 2014-04-30 ENCOUNTER — Encounter (HOSPITAL_COMMUNITY): Payer: Self-pay | Admitting: Emergency Medicine

## 2014-04-30 DIAGNOSIS — Z8679 Personal history of other diseases of the circulatory system: Secondary | ICD-10-CM | POA: Insufficient documentation

## 2014-04-30 DIAGNOSIS — F3289 Other specified depressive episodes: Secondary | ICD-10-CM | POA: Insufficient documentation

## 2014-04-30 DIAGNOSIS — Z95810 Presence of automatic (implantable) cardiac defibrillator: Secondary | ICD-10-CM | POA: Insufficient documentation

## 2014-04-30 DIAGNOSIS — Z79899 Other long term (current) drug therapy: Secondary | ICD-10-CM | POA: Insufficient documentation

## 2014-04-30 DIAGNOSIS — Z862 Personal history of diseases of the blood and blood-forming organs and certain disorders involving the immune mechanism: Secondary | ICD-10-CM | POA: Insufficient documentation

## 2014-04-30 DIAGNOSIS — S52502A Unspecified fracture of the lower end of left radius, initial encounter for closed fracture: Secondary | ICD-10-CM

## 2014-04-30 DIAGNOSIS — F329 Major depressive disorder, single episode, unspecified: Secondary | ICD-10-CM | POA: Insufficient documentation

## 2014-04-30 DIAGNOSIS — Y929 Unspecified place or not applicable: Secondary | ICD-10-CM | POA: Insufficient documentation

## 2014-04-30 DIAGNOSIS — Y999 Unspecified external cause status: Secondary | ICD-10-CM | POA: Insufficient documentation

## 2014-04-30 DIAGNOSIS — Y939 Activity, unspecified: Secondary | ICD-10-CM | POA: Insufficient documentation

## 2014-04-30 DIAGNOSIS — S52509A Unspecified fracture of the lower end of unspecified radius, initial encounter for closed fracture: Secondary | ICD-10-CM | POA: Insufficient documentation

## 2014-04-30 DIAGNOSIS — I10 Essential (primary) hypertension: Secondary | ICD-10-CM | POA: Insufficient documentation

## 2014-04-30 DIAGNOSIS — Z8639 Personal history of other endocrine, nutritional and metabolic disease: Secondary | ICD-10-CM | POA: Insufficient documentation

## 2014-04-30 DIAGNOSIS — I4891 Unspecified atrial fibrillation: Secondary | ICD-10-CM | POA: Insufficient documentation

## 2014-04-30 DIAGNOSIS — I5022 Chronic systolic (congestive) heart failure: Secondary | ICD-10-CM | POA: Insufficient documentation

## 2014-04-30 DIAGNOSIS — Z87891 Personal history of nicotine dependence: Secondary | ICD-10-CM | POA: Insufficient documentation

## 2014-04-30 DIAGNOSIS — S52609A Unspecified fracture of lower end of unspecified ulna, initial encounter for closed fracture: Principal | ICD-10-CM

## 2014-04-30 DIAGNOSIS — W010XXA Fall on same level from slipping, tripping and stumbling without subsequent striking against object, initial encounter: Secondary | ICD-10-CM | POA: Insufficient documentation

## 2014-04-30 DIAGNOSIS — Z7982 Long term (current) use of aspirin: Secondary | ICD-10-CM | POA: Insufficient documentation

## 2014-04-30 DIAGNOSIS — S52613A Displaced fracture of unspecified ulna styloid process, initial encounter for closed fracture: Secondary | ICD-10-CM

## 2014-04-30 MED ORDER — TRAMADOL HCL 50 MG PO TABS: 50.0000 mg | ORAL_TABLET | Freq: Four times a day (QID) | ORAL | Status: DC | PRN

## 2014-04-30 MED ORDER — IBUPROFEN 800 MG PO TABS
800.0000 mg | ORAL_TABLET | Freq: Once | ORAL | Status: AC
  Administered 2014-04-30: 800 mg via ORAL
  Filled 2014-04-30: qty 1

## 2014-04-30 NOTE — ED Notes (Signed)
PA at bedside.

## 2014-04-30 NOTE — ED Provider Notes (Signed)
Medical screening examination/treatment/procedure(s) were performed by non-physician practitioner and as supervising physician I was immediately available for consultation/collaboration.   EKG Interpretation None       Doug Sou, MD 04/30/14 1709

## 2014-04-30 NOTE — ED Notes (Signed)
Ortho tech paged for Wrist splint

## 2014-04-30 NOTE — ED Notes (Signed)
Per pt sts she tripped and fell this am. Injury to left wrist. Able to move fingers and feeling intact.

## 2014-04-30 NOTE — ED Provider Notes (Signed)
CSN: [id number]     Arrival date & time 04/30/14  0711 History   First MD Initiated Contact with Patient 04/30/14 0715     Chief Complaint  Patient presents with  . Fall  . Wrist Pain     (Consider location/radiation/quality/duration/timing/severity/associated sxs/prior Treatment) HPI Comments: 53 year old female with a past medical history of depression, hypertension, polysubstance abuse, nonischemic cardiomyopathy, atrial fibrillation, CHF and left bundle branch block presents to the emergency department complaining of left wrist pain after tripping and falling about 10 minutes prior to arrival. Patient states she tripped over her feet causing her to land directly onto her left wrist. No head injury. Pain currently described as sharp and throbbing, radiating from her wrist up her forearm rated 10 out of 10. She has not had any alleviating factors for her symptoms. Pain worse with movement. Denies numbness or tingling.  Patient is a 53 y.o. female presenting with fall and wrist pain. The history is provided by the patient.  Fall Pertinent negatives include no numbness.  Wrist Pain Pertinent negatives include no numbness.    Past Medical History  Diagnosis Date  . Other left bundle branch block   . Automatic implantable cardiac defibrillator in situ     a. 2010/08/02 s/p  s/p SJM 3231-40 Bi V ICD Ser # [id number].  Marland Kitchen Chronic systolic heart failure   . Anemia, unspecified   . Paroxysmal supraventricular tachycardia   . Nonischemic cardiomyopathy     a. August 02, 2010 OOH VF arrest;  b. 08/02/10 Cath: nonobs dzs, EF 15-20%, glob HK;  c. 2010/08/02 Echo: EF 15%, diff HK, Gr 2 DD;  d. 08-02-10 Cardiac MRI EF of 29%, no scar;  e. Aug 02, 2010 s/p SJM 3231-40 Bi V ICD Ser # [id number].  Marland Kitchen Atrial fibrillation     a. This occurred in setting of resuscitation efforts during VF arrest.  . Cardiac arrest     a. 08/02/10  . History of tobacco abuse   . History of cocaine abuse     remote  . Thyroid disease   . SUBSTANCE ABUSE,  MULTIPLE 08/13/2010    Qualifier: Diagnosis of  By: Denyse Amass CMA, Carol    . Hypertension   . Depression     ok   Past Surgical History  Procedure Laterality Date  . Cardiac defibrillator placement      [facility] CD [id number] Q defib, serial number [id number]  . Cardiac defibrillator placement     Family History  Problem Relation Age of Onset  . Heart disease Father   . Hypertension Father   . Hypertension Mother   . Heart disease Mother    History  Substance Use Topics  . Smoking status: Former Smoker    Quit date: 07/28/2011  . Smokeless tobacco: Never Used  . Alcohol Use: No   OB History   Grav Para Term Preterm Abortions TAB SAB Ect Mult Living   4 4 4       4      Review of Systems  Constitutional: Negative.   HENT: Negative.   Musculoskeletal:       Positive for left wrist pain and swelling.  Skin: Negative for color change and wound.  Neurological: Negative for numbness.      Allergies  Review of patient's allergies indicates no known allergies.  Home Medications   Prior to Admission medications   Medication Sig Start Date End Date Taking? Authorizing Provider  albuterol (PROVENTIL HFA;VENTOLIN HFA) 108 (90 BASE) MCG/ACT inhaler Inhale 2 puffs  into the lungs every 4 (four) hours as needed. For shortness of breath or wheezing   Yes Historical Provider, MD  aspirin 81 MG chewable tablet Chew 81 mg by mouth daily.   Yes Historical Provider, MD  carvedilol (COREG) 25 MG tablet Take 0.5 tablets (12.5 mg total) by mouth 2 (two) times daily with a meal. 04/24/14  Yes Kathleene Hazelhristopher D McAlhany, MD  diphenhydrAMINE (BENADRYL) 25 MG tablet Take 1 tablet (25 mg total) by mouth every 6 (six) hours as needed for itching (Rash). 05/27/13  Yes Antony MaduraKelly Humes, PA-C  furosemide (LASIX) 40 MG tablet Take 1 tablet (40 mg total) by mouth daily. 03/28/13  Yes Dolores Pattyaniel R Bensimhon, MD  hydrocortisone cream 1 % Apply 1 application topically 2 (two) times daily as needed for itching.   Yes  Historical Provider, MD  ibuprofen (ADVIL,MOTRIN) 200 MG tablet Take 200 mg by mouth every 6 (six) hours as needed for mild pain.   Yes Historical Provider, MD  isosorbide-hydrALAZINE (BIDIL) 20-37.5 MG per tablet Take 1 tablet by mouth 2 (two) times daily. 11/10/12  Yes Duke SalviaSteven C Klein, MD  lisinopril (PRINIVIL,ZESTRIL) 5 MG tablet take 1 tablet by mouth once daily 09/05/13  Yes Kathleene Hazelhristopher D McAlhany, MD  Multiple Vitamin (MULTIVITAMIN WITH MINERALS) TABS Take 1 tablet by mouth daily.   Yes Historical Provider, MD  polyethylene glycol (MIRALAX / GLYCOLAX) packet Take 17 g by mouth daily as needed (constipation).  10/28/12  Yes Rolan BuccoMelanie Belfi, MD  potassium chloride SA (K-DUR,KLOR-CON) 20 MEQ tablet Take 20 mEq by mouth daily.   Yes Historical Provider, MD  promethazine (PHENERGAN) 25 MG tablet Take 25 mg by mouth every 8 (eight) hours as needed. For nausea   Yes Historical Provider, MD  ranitidine (ZANTAC) 150 MG tablet Take 150 mg by mouth daily as needed for heartburn.   Yes Historical Provider, MD  traMADol (ULTRAM) 50 MG tablet Take 1 tablet (50 mg total) by mouth every 6 (six) hours as needed. FOR PAIN 01/07/14  Yes Linwood DibblesJon Knapp, MD  traMADol (ULTRAM) 50 MG tablet Take 1 tablet (50 mg total) by mouth every 6 (six) hours as needed. 04/30/14   Trevor Maceobyn M Albert, PA-C   BP 131/86  Pulse 78  Temp(Src) 98.7 F (37.1 C) (Oral)  Resp 18  SpO2 97% Physical Exam  Nursing note and vitals reviewed. Constitutional: She is oriented to person, place, and time. She appears well-developed and well-nourished. No distress.  HENT:  Head: Normocephalic and atraumatic.  Mouth/Throat: Oropharynx is clear and moist.  Eyes: Conjunctivae and EOM are normal.  Neck: Normal range of motion. Neck supple.  Cardiovascular: Normal rate, regular rhythm, normal heart sounds and intact distal pulses.   +2 radial pulse on right.  Pulmonary/Chest: Effort normal and breath sounds normal. No respiratory distress.  Musculoskeletal:   TTP left carpal bones, distal radius and ulna with swelling and slight deformity. ROM limited due to pain. Left proximal forearm and elbow non-tender. Full L elbow ROM. Able to wiggle fingers.  Neurological: She is alert and oriented to person, place, and time. No sensory deficit.  Skin: Skin is warm and dry.  Cap refill < 3 seconds.  Psychiatric: She has a normal mood and affect. Her behavior is normal.    ED Course  Procedures (including critical care time) Labs Review Labs Reviewed - No data to display  Imaging Review Dg Wrist Complete Left  04/30/2014   CLINICAL DATA:  Pain post trauma  EXAM: LEFT WRIST - COMPLETE 3+  VIEW  COMPARISON:  None.  FINDINGS: Frontal, oblique, lateral, and ulnar deviation scaphoid images were obtained. There is a fracture of the distal radial metaphysis with impaction and dorsal angulation distally. There is avulsion of the ulnar styloid. No other fractures are identified. No dislocation. Joint spaces appear intact.  IMPRESSION: Fracture distal radial metaphysis with dorsal angulation distally. Avulsion ulnar styloid. No dislocation.   Electronically Signed   By: Bretta Bang M.D.   On: 04/30/2014 08:02     EKG Interpretation None      MDM   Final diagnoses:  Distal radius fracture, left  Fracture of ulnar styloid    Patient presenting with wrist injury after fall. She is well appearing and in no apparent distress. Vital signs stable. Neurovascularly intact. X-ray showing fracture distal radial metaphysis with dorsal angulation distally, avulsion ulnar styloid. No dislocation. I spoke with hand surgery on call Dr. Janee Morn who states patient can followup in his office later this week. Splint applied. Stable for discharge. Return precautions given. Patient states understanding of treatment care plan and is agreeable.   Trevor Mace, PA-C 04/30/14 1020

## 2014-04-30 NOTE — Discharge Instructions (Signed)
You have a broken wrist. Take tramadol as directed as needed for pain. Followup with Dr. Janee Morn this week.  Cast or Splint Care Casts and splints support injured limbs and keep bones from moving while they heal. It is important to care for your cast or splint at home.  HOME CARE INSTRUCTIONS  Keep the cast or splint uncovered during the drying period. It can take 24 to 48 hours to dry if it is made of plaster. A fiberglass cast will dry in less than 1 hour.  Do not rest the cast on anything harder than a pillow for the first 24 hours.  Do not put weight on your injured limb or apply pressure to the cast until your health care provider gives you permission.  Keep the cast or splint dry. Wet casts or splints can lose their shape and may not support the limb as well. A wet cast that has lost its shape can also create harmful pressure on your skin when it dries. Also, wet skin can become infected.  Cover the cast or splint with a plastic bag when bathing or when out in the rain or snow. If the cast is on the trunk of the body, take sponge baths until the cast is removed.  If your cast does become wet, dry it with a towel or a blow dryer on the cool setting only.  Keep your cast or splint clean. Soiled casts may be wiped with a moistened cloth.  Do not place any hard or soft foreign objects under your cast or splint, such as cotton, toilet paper, lotion, or powder.  Do not try to scratch the skin under the cast with any object. The object could get stuck inside the cast. Also, scratching could lead to an infection. If itching is a problem, use a blow dryer on a cool setting to relieve discomfort.  Do not trim or cut your cast or remove padding from inside of it.  Exercise all joints next to the injury that are not immobilized by the cast or splint. For example, if you have a long leg cast, exercise the hip joint and toes. If you have an arm cast or splint, exercise the shoulder, elbow, thumb,  and fingers.  Elevate your injured arm or leg on 1 or 2 pillows for the first 1 to 3 days to decrease swelling and pain.It is best if you can comfortably elevate your cast so it is higher than your heart. SEEK MEDICAL CARE IF:   Your cast or splint cracks.  Your cast or splint is too tight or too loose.  You have unbearable itching inside the cast.  Your cast becomes wet or develops a soft spot or area.  You have a bad smell coming from inside your cast.  You get an object stuck under your cast.  Your skin around the cast becomes red or raw.  You have new pain or worsening pain after the cast has been applied. SEEK IMMEDIATE MEDICAL CARE IF:   You have fluid leaking through the cast.  You are unable to move your fingers or toes.  You have discolored (blue or white), cool, painful, or very swollen fingers or toes beyond the cast.  You have tingling or numbness around the injured area.  You have severe pain or pressure under the cast.  You have any difficulty with your breathing or have shortness of breath.  You have chest pain. Document Released: 11/13/2000 Document Revised: 09/06/2013 Document Reviewed: 05/25/2013 ExitCare  Patient Information 2014 [place] [place].  Radial Fracture You have a broken bone (fracture) of the forearm. This is the part of your arm between the elbow and your wrist. Your forearm is made up of two bones. These are the radius and ulna. Your fracture is in the radial shaft. This is the bone in your forearm located on the thumb side. A cast or splint is used to protect and keep your injured bone from moving. The cast or splint will be on generally for about 5 to 6 weeks, with individual variations. HOME CARE INSTRUCTIONS   Keep the injured part elevated while sitting or lying down. Keep the injury above the level of your heart (the center of the chest). This will decrease swelling and pain.  Apply ice to the injury for 15-20 minutes, 03-04 times per  day while awake, for 2 days. Put the ice in a plastic bag and place a towel between the bag of ice and your cast or splint.  Move your fingers to avoid stiffness and minimize swelling.  If you have a plaster or fiberglass cast:  Do not try to scratch the skin under the cast using sharp or pointed objects.  Check the skin around the cast every day. You may put lotion on any red or sore areas.  Keep your cast dry and clean.  If you have a plaster splint:  Wear the splint as directed.  You may loosen the elastic around the splint if your fingers become numb, tingle, or turn cold or blue.  Do not put pressure on any part of your cast or splint. It may break. Rest your cast only on a pillow for the first 24 hours until it is fully hardened.  Your cast or splint can be protected during bathing with a plastic bag. Do not lower the cast or splint into water.  Only take over-the-counter or prescription medicines for pain, discomfort, or fever as directed by your caregiver. SEEK IMMEDIATE MEDICAL CARE IF:   Your cast gets damaged or breaks.  You have more severe pain or swelling than you did before getting the cast.  You have severe pain when stretching your fingers.  There is a bad smell, new stains and/or pus-like (purulent) drainage coming from under the cast.  Your fingers or hand turn pale or blue and become cold or your loose feeling. Document Released: 04/29/2006 Document Revised: 02/08/2012 Document Reviewed: 07/26/2006 [facility] Patient Information 2014 [place], [place].

## 2014-04-30 NOTE — Progress Notes (Signed)
Orthopedic Tech Progress Note Patient Details:  Angela Payne 1961/01/16 [id number] Sugartong splint applied to LUE. Application tolerated well. Arm sling provided. Ortho Devices Type of Ortho Device: Ace wrap;Sugartong splint Ortho Device/Splint Location: LUe Ortho Device/Splint Interventions: Application   Asia R Thompson 04/30/2014, 9:22 AM

## 2014-04-30 NOTE — ED Notes (Signed)
Patient transported to X-ray 

## 2014-05-01 ENCOUNTER — Other Ambulatory Visit: Payer: Self-pay | Admitting: Obstetrics & Gynecology

## 2014-05-01 DIAGNOSIS — N6310 Unspecified lump in the right breast, unspecified quadrant: Secondary | ICD-10-CM

## 2014-05-03 ENCOUNTER — Encounter (HOSPITAL_COMMUNITY): Payer: Self-pay | Admitting: Pharmacy Technician

## 2014-05-03 ENCOUNTER — Other Ambulatory Visit: Payer: Self-pay | Admitting: Orthopedic Surgery

## 2014-05-04 ENCOUNTER — Encounter (HOSPITAL_BASED_OUTPATIENT_CLINIC_OR_DEPARTMENT_OTHER): Payer: Self-pay | Admitting: *Deleted

## 2014-05-04 NOTE — H&P (Signed)
Angela Payne is an 53 y.o. female.   CC / Reason for Visit: Left wrist injury HPI: This patient is a 53 year old female who presents for evaluation of a left distal radius fracture that occurred when she fell onto an outstretched hand at home.  She was placed into a sugar tong splint and provided some tramadol.  She reports some continued pain that is not at times adequately relieved by tramadol.  Past Medical History  Diagnosis Date  . Other left bundle branch block   . Automatic implantable cardiac defibrillator in situ     a. 08/12/2010 s/p  s/p SJM 3231-40 Bi V ICD Ser # [id number].  Marland Kitchen Chronic systolic heart failure   . Anemia, unspecified   . Paroxysmal supraventricular tachycardia   . Nonischemic cardiomyopathy     a. 08-12-10 OOH VF arrest;  b. Aug 12, 2010 Cath: nonobs dzs, EF 15-20%, glob HK;  c. 08/12/10 Echo: EF 15%, diff HK, Gr 2 DD;  d. 08/12/10 Cardiac MRI EF of 29%, no scar;  e. 08-12-10 s/p SJM 3231-40 Bi V ICD Ser # [id number].  Marland Kitchen Atrial fibrillation     a. This occurred in setting of resuscitation efforts during VF arrest.  . Cardiac arrest     a. August 12, 2010  . History of tobacco abuse   . History of cocaine abuse     remote  . Thyroid disease   . SUBSTANCE ABUSE, MULTIPLE 08/13/2010    Qualifier: Diagnosis of  By: Denyse Amass CMA, Carol    . Hypertension   . Depression     ok    Past Surgical History  Procedure Laterality Date  . Cardiac defibrillator placement      [facility] [place] [id number] Q defib, serial number [id number]  . Cardiac defibrillator placement      Family History  Problem Relation Age of Onset  . Heart disease Father   . Hypertension Father   . Hypertension Mother   . Heart disease Mother    Social History:  reports that she quit smoking about 2 years ago. She has never used smokeless tobacco. She reports that she does not drink alcohol or use illicit drugs.  Allergies: No Known Allergies  No prescriptions prior to admission    No results found for this or any previous  visit (from the past 48 hour(s)). No results found.  ROS  There were no vitals taken for this visit. Physical Exam  Constitutional:  WD, WN, NAD HEENT:  NCAT, EOMI Neuro/Psych:  Alert & oriented to person, place, and time; appropriate mood & affect Lymphatic: No generalized UE edema or lymphadenopathy Extremities / MSK:  Both UE are normal with respect to appearance, ranges of motion, joint stability, muscle strength/tone, sensation, & perfusion except as otherwise noted:  Left upper extremity sugar tong splint in place.  Finger is only very mildly puffy with good motion.  NVI.  No ulcerations at the margins of the splint  Labs / Xrays:  No radiographic studies obtained today.  Injury x-rays reviewed revealing a shortened and dorsally tilted slightly translated distal radius fracture  Assessment: Displaced angulated left distal radius fracture  Plan:  Discussed these findings with her and expectations should she choose nonoperative management versus open treatment to obtain and maintain an more acceptable alignment to her fracture.  Ultimately she decided to proceed operatively as recommended.    The details of the operative procedure were discussed with the patient.  Questions were invited and answered.  In addition to the  goal of the procedure, the risks of the procedure to include but not limited to bleeding; infection; damage to the nerves or blood vessels that could result in bleeding, numbness, weakness, chronic pain, and the need for additional procedures; stiffness; the need for revision surgery; and anesthetic risks, the worst of which is death, were reviewed.  No specific outcome was guaranteed or implied.  Informed consent was obtained.  Prescriptions for postoperative analgesia were also written.  A return appointment 10-15 days postop was made.  Jodi Marbleavid A Koby Hartfield 05/04/2014, 11:01 AM

## 2014-05-06 MED ORDER — CEFAZOLIN SODIUM-DEXTROSE 2-3 GM-% IV SOLR
2.0000 g | INTRAVENOUS | Status: DC
  Filled 2014-05-06: qty 50

## 2014-05-07 ENCOUNTER — Ambulatory Visit (HOSPITAL_COMMUNITY): Payer: Medicaid Other

## 2014-05-07 ENCOUNTER — Encounter (HOSPITAL_COMMUNITY)
Admission: RE | Disposition: A | Discharge status: Home / Self Care | Payer: Self-pay | Source: Ambulatory Visit | Attending: Orthopedic Surgery

## 2014-05-07 ENCOUNTER — Ambulatory Visit (HOSPITAL_BASED_OUTPATIENT_CLINIC_OR_DEPARTMENT_OTHER)
Admission: RE | Admit: 2014-05-07 | Discharge: 2014-05-07 | Disposition: A | Discharge status: Home / Self Care | Payer: Medicaid Other | Source: Ambulatory Visit | Attending: Orthopedic Surgery | Admitting: Orthopedic Surgery

## 2014-05-07 ENCOUNTER — Ambulatory Visit (HOSPITAL_COMMUNITY): Payer: Medicaid Other | Admitting: Anesthesiology

## 2014-05-07 ENCOUNTER — Encounter (HOSPITAL_COMMUNITY): Payer: Self-pay | Admitting: *Deleted

## 2014-05-07 ENCOUNTER — Encounter (HOSPITAL_COMMUNITY): Payer: Medicaid Other | Admitting: Anesthesiology

## 2014-05-07 DIAGNOSIS — F3289 Other specified depressive episodes: Secondary | ICD-10-CM | POA: Insufficient documentation

## 2014-05-07 DIAGNOSIS — I447 Left bundle-branch block, unspecified: Secondary | ICD-10-CM | POA: Insufficient documentation

## 2014-05-07 DIAGNOSIS — I10 Essential (primary) hypertension: Secondary | ICD-10-CM | POA: Insufficient documentation

## 2014-05-07 DIAGNOSIS — Z87891 Personal history of nicotine dependence: Secondary | ICD-10-CM | POA: Insufficient documentation

## 2014-05-07 DIAGNOSIS — W19XXXA Unspecified fall, initial encounter: Secondary | ICD-10-CM | POA: Insufficient documentation

## 2014-05-07 DIAGNOSIS — D649 Anemia, unspecified: Secondary | ICD-10-CM | POA: Insufficient documentation

## 2014-05-07 DIAGNOSIS — I5022 Chronic systolic (congestive) heart failure: Secondary | ICD-10-CM | POA: Insufficient documentation

## 2014-05-07 DIAGNOSIS — S52599A Other fractures of lower end of unspecified radius, initial encounter for closed fracture: Secondary | ICD-10-CM | POA: Insufficient documentation

## 2014-05-07 DIAGNOSIS — F329 Major depressive disorder, single episode, unspecified: Secondary | ICD-10-CM | POA: Insufficient documentation

## 2014-05-07 DIAGNOSIS — I509 Heart failure, unspecified: Secondary | ICD-10-CM | POA: Insufficient documentation

## 2014-05-07 DIAGNOSIS — Y92009 Unspecified place in unspecified non-institutional (private) residence as the place of occurrence of the external cause: Secondary | ICD-10-CM | POA: Insufficient documentation

## 2014-05-07 HISTORY — DX: Heart failure, unspecified: I50.9

## 2014-05-07 HISTORY — DX: Gastro-esophageal reflux disease without esophagitis: K21.9

## 2014-05-07 HISTORY — DX: Shortness of breath: R06.02

## 2014-05-07 HISTORY — DX: Presence of cardiac pacemaker: Z95.0

## 2014-05-07 HISTORY — PX: OPEN REDUCTION INTERNAL FIXATION (ORIF) DISTAL RADIAL FRACTURE: SHX5989

## 2014-05-07 HISTORY — DX: Presence of automatic (implantable) cardiac defibrillator: Z95.810

## 2014-05-07 LAB — COMPREHENSIVE METABOLIC PANEL
ALK PHOS: 83 U/L (ref 39–117)
ALT: 12 U/L (ref 0–35)
AST: 18 U/L (ref 0–37)
Albumin: 3.5 g/dL (ref 3.5–5.2)
BUN: 19 mg/dL (ref 6–23)
CO2: 24 mEq/L (ref 19–32)
Calcium: 9.1 mg/dL (ref 8.4–10.5)
Chloride: 103 mEq/L (ref 96–112)
Creatinine, Ser: 1.12 mg/dL — ABNORMAL HIGH (ref 0.50–1.10)
GFR calc Af Amer: 65 mL/min — ABNORMAL LOW (ref >90)
GFR calc non Af Amer: 56 mL/min — ABNORMAL LOW (ref >90)
Glucose, Bld: 89 mg/dL (ref 70–99)
Potassium: 4 mEq/L (ref 3.7–5.3)
SODIUM: 139 meq/L (ref 137–147)
TOTAL PROTEIN: 7.9 g/dL (ref 6.0–8.3)
Total Bilirubin: 0.3 mg/dL (ref 0.3–1.2)

## 2014-05-07 LAB — RAPID URINE DRUG SCREEN, HOSP PERFORMED
AMPHETAMINES: NOT DETECTED
BARBITURATES: NOT DETECTED
Benzodiazepines: NOT DETECTED
Cocaine: POSITIVE — AB
OPIATES: NOT DETECTED
Tetrahydrocannabinol: NOT DETECTED

## 2014-05-07 LAB — CBC
HCT: 40.4 % (ref 36.0–46.0)
Hemoglobin: 13.1 g/dL (ref 12.0–15.0)
MCH: 26.9 pg (ref 26.0–34.0)
MCHC: 32.4 g/dL (ref 30.0–36.0)
MCV: 83 fL (ref 78.0–100.0)
PLATELETS: 276 10*3/uL (ref 150–400)
RBC: 4.87 MIL/uL (ref 3.87–5.11)
RDW: 15.4 % (ref 11.5–15.5)
WBC: 7.1 10*3/uL (ref 4.0–10.5)

## 2014-05-07 LAB — HCG, SERUM, QUALITATIVE: Preg, Serum: NEGATIVE

## 2014-05-07 SURGERY — OPEN REDUCTION INTERNAL FIXATION (ORIF) DISTAL RADIUS FRACTURE
Anesthesia: General | Site: Arm Lower | Laterality: Left

## 2014-05-07 SURGERY — OPEN REDUCTION INTERNAL FIXATION (ORIF) DISTAL RADIUS FRACTURE
Anesthesia: General | Laterality: Left

## 2014-05-07 MED ORDER — 0.9 % SODIUM CHLORIDE (POUR BTL) OPTIME
TOPICAL | Status: DC | PRN
  Administered 2014-05-07: 1000 mL

## 2014-05-07 MED ORDER — METOCLOPRAMIDE HCL 5 MG/ML IJ SOLN: 10.0000 mg | Freq: Once | INTRAMUSCULAR | Status: DC | PRN

## 2014-05-07 MED ORDER — LACTATED RINGERS IV SOLN
INTRAVENOUS | Status: DC | PRN
  Administered 2014-05-07 (×2): via INTRAVENOUS

## 2014-05-07 MED ORDER — CARVEDILOL 12.5 MG PO TABS
12.5000 mg | ORAL_TABLET | Freq: Once | ORAL | Status: AC
  Administered 2014-05-07: 12.5 mg via ORAL

## 2014-05-07 MED ORDER — HYDROMORPHONE HCL PF 1 MG/ML IJ SOLN
INTRAMUSCULAR | Status: AC
  Administered 2014-05-07: 0.5 mg via INTRAVENOUS
  Filled 2014-05-07: qty 1

## 2014-05-07 MED ORDER — METOCLOPRAMIDE HCL 5 MG/ML IJ SOLN
INTRAMUSCULAR | Status: AC
  Filled 2014-05-07: qty 2

## 2014-05-07 MED ORDER — OXYCODONE HCL 5 MG PO TABS
5.0000 mg | ORAL_TABLET | Freq: Once | ORAL | Status: AC | PRN
  Administered 2014-05-07: 5 mg via ORAL

## 2014-05-07 MED ORDER — MIDAZOLAM HCL 2 MG/2ML IJ SOLN
INTRAMUSCULAR | Status: AC
  Filled 2014-05-07: qty 2

## 2014-05-07 MED ORDER — FENTANYL CITRATE 0.05 MG/ML IJ SOLN
INTRAMUSCULAR | Status: DC | PRN
  Administered 2014-05-07 (×2): 100 ug via INTRAVENOUS
  Administered 2014-05-07: 50 ug via INTRAVENOUS

## 2014-05-07 MED ORDER — LIDOCAINE HCL (CARDIAC) 20 MG/ML IV SOLN
INTRAVENOUS | Status: DC | PRN
  Administered 2014-05-07: 30 mg via INTRAVENOUS

## 2014-05-07 MED ORDER — PROPOFOL 10 MG/ML IV BOLUS
INTRAVENOUS | Status: DC | PRN
  Administered 2014-05-07: 200 mg via INTRAVENOUS

## 2014-05-07 MED ORDER — ONDANSETRON HCL 4 MG/2ML IJ SOLN
INTRAMUSCULAR | Status: DC | PRN
  Administered 2014-05-07: 4 mg via INTRAVENOUS

## 2014-05-07 MED ORDER — LIDOCAINE HCL (CARDIAC) 20 MG/ML IV SOLN
INTRAVENOUS | Status: AC
  Filled 2014-05-07: qty 5

## 2014-05-07 MED ORDER — ONDANSETRON HCL 4 MG/2ML IJ SOLN
INTRAMUSCULAR | Status: AC
  Filled 2014-05-07: qty 2

## 2014-05-07 MED ORDER — BUPIVACAINE-EPINEPHRINE 0.5% -1:200000 IJ SOLN
INTRAMUSCULAR | Status: DC | PRN
  Administered 2014-05-07: 10 mL

## 2014-05-07 MED ORDER — OXYCODONE HCL 5 MG/5ML PO SOLN: 5.0000 mg | Freq: Once | ORAL | Status: AC | PRN

## 2014-05-07 MED ORDER — HYDROMORPHONE HCL PF 1 MG/ML IJ SOLN
0.2500 mg | INTRAMUSCULAR | Status: DC | PRN
  Administered 2014-05-07 (×4): 0.5 mg via INTRAVENOUS

## 2014-05-07 MED ORDER — OXYCODONE-ACETAMINOPHEN 5-325 MG PO TABS: 1.0000 | ORAL_TABLET | ORAL | Status: DC | PRN

## 2014-05-07 MED ORDER — BUPIVACAINE-EPINEPHRINE (PF) 0.5% -1:200000 IJ SOLN
INTRAMUSCULAR | Status: AC
  Filled 2014-05-07: qty 30

## 2014-05-07 MED ORDER — PROPOFOL 10 MG/ML IV BOLUS
INTRAVENOUS | Status: AC
Start: 2014-05-07 — End: 2014-05-07
  Filled 2014-05-07: qty 20

## 2014-05-07 MED ORDER — OXYCODONE HCL 5 MG PO TABS
ORAL_TABLET | ORAL | Status: AC
  Filled 2014-05-07: qty 1

## 2014-05-07 MED ORDER — MIDAZOLAM HCL 5 MG/5ML IJ SOLN
INTRAMUSCULAR | Status: DC | PRN
  Administered 2014-05-07 (×2): 1 mg via INTRAVENOUS

## 2014-05-07 MED ORDER — FENTANYL CITRATE 0.05 MG/ML IJ SOLN
INTRAMUSCULAR | Status: AC
  Filled 2014-05-07: qty 5

## 2014-05-07 MED ORDER — METOCLOPRAMIDE HCL 5 MG/ML IJ SOLN
INTRAMUSCULAR | Status: DC | PRN
  Administered 2014-05-07: 10 mg via INTRAVENOUS

## 2014-05-07 MED ORDER — DEXAMETHASONE SODIUM PHOSPHATE 10 MG/ML IJ SOLN
INTRAMUSCULAR | Status: DC | PRN
  Administered 2014-05-07: 4 mg via INTRAVENOUS

## 2014-05-07 MED ORDER — CHLORHEXIDINE GLUCONATE 4 % EX LIQD
60.0000 mL | Freq: Once | CUTANEOUS | Status: DC
  Filled 2014-05-07: qty 60

## 2014-05-07 MED ORDER — CARVEDILOL 12.5 MG PO TABS
ORAL_TABLET | ORAL | Status: AC
  Filled 2014-05-07: qty 1

## 2014-05-07 MED ORDER — DEXAMETHASONE SODIUM PHOSPHATE 4 MG/ML IJ SOLN
INTRAMUSCULAR | Status: AC
  Filled 2014-05-07: qty 1

## 2014-05-07 MED ORDER — LACTATED RINGERS IV SOLN
INTRAVENOUS | Status: DC
  Administered 2014-05-07: 08:00:00 via INTRAVENOUS

## 2014-05-07 SURGICAL SUPPLY — 55 items
BANDAGE COBAN STERILE 2 (GAUZE/BANDAGES/DRESSINGS) IMPLANT
BANDAGE GAUZE ELAST BULKY 4 IN (GAUZE/BANDAGES/DRESSINGS) ×4 IMPLANT
BIT DRILL 2 FAST STEP (BIT) ×2 IMPLANT
BIT DRILL 2.5X4 QC (BIT) ×2 IMPLANT
BLADE SURG 15 STRL LF DISP TIS (BLADE) ×1 IMPLANT
BLADE SURG 15 STRL SS (BLADE) ×3
BNDG CMPR 9X4 STRL LF SNTH (GAUZE/BANDAGES/DRESSINGS) ×1
BNDG COHESIVE 4X5 TAN STRL (GAUZE/BANDAGES/DRESSINGS) ×3 IMPLANT
BNDG ESMARK 4X9 LF (GAUZE/BANDAGES/DRESSINGS) ×3 IMPLANT
CANISTER SUCTION 2500CC (MISCELLANEOUS) ×3 IMPLANT
CHLORAPREP W/TINT 26ML (MISCELLANEOUS) ×3 IMPLANT
CORDS BIPOLAR (ELECTRODE) ×3 IMPLANT
COVER SURGICAL LIGHT HANDLE (MISCELLANEOUS) ×3 IMPLANT
COVER TABLE BACK 60X90 (DRAPES) ×3 IMPLANT
CUFF TOURNIQUET SINGLE 18IN (TOURNIQUET CUFF) IMPLANT
CUFF TOURNIQUET SINGLE 24IN (TOURNIQUET CUFF) IMPLANT
DRAPE C-ARM 42X72 X-RAY (DRAPES) ×3 IMPLANT
DRAPE SURG 17X23 STRL (DRAPES) ×3 IMPLANT
DRSG EMULSION OIL 3X3 NADH (GAUZE/BANDAGES/DRESSINGS) ×2 IMPLANT
GLOVE BIO SURGEON STRL SZ7.5 (GLOVE) ×5 IMPLANT
GLOVE BIOGEL PI IND STRL 8 (GLOVE) ×1 IMPLANT
GLOVE BIOGEL PI INDICATOR 8 (GLOVE) ×2
GLOVE ECLIPSE 6.5 STRL STRAW (GLOVE) ×2 IMPLANT
GOWN STRL REUS W/ TWL XL LVL3 (GOWN DISPOSABLE) ×1 IMPLANT
GOWN STRL REUS W/TWL XL LVL3 (GOWN DISPOSABLE) ×3
K-WIRE 1.6 (WIRE) ×6
K-WIRE FX5X1.6XNS BN SS (WIRE) ×2
KIT BASIN OR (CUSTOM PROCEDURE TRAY) ×3 IMPLANT
KWIRE FX5X1.6XNS BN SS (WIRE) IMPLANT
NEEDLE HYPO 22GX1.5 SAFETY (NEEDLE) ×3 IMPLANT
NS IRRIG 1000ML POUR BTL (IV SOLUTION) ×3 IMPLANT
PACK ORTHO EXTREMITY (CUSTOM PROCEDURE TRAY) ×3 IMPLANT
PAD CAST 4YDX4 CTTN HI CHSV (CAST SUPPLIES) ×1 IMPLANT
PADDING CAST ABS 4INX4YD NS (CAST SUPPLIES) ×2
PADDING CAST ABS COTTON 4X4 ST (CAST SUPPLIES) IMPLANT
PADDING CAST COTTON 4X4 STRL (CAST SUPPLIES) ×3
PEG SUBCHONDRAL SMOOTH 2.0X16 (Peg) ×2 IMPLANT
PEG SUBCHONDRAL SMOOTH 2.0X20 (Peg) ×4 IMPLANT
PEG SUBCHONDRAL SMOOTH 2.0X22 (Peg) ×4 IMPLANT
PENCIL BUTTON HOLSTER BLD 10FT (ELECTRODE) ×2 IMPLANT
PLATE SHORT 21.6X48.9 NRRW LT (Plate) ×2 IMPLANT
SCREW BN 12X3.5XNS CORT TI (Screw) IMPLANT
SCREW BN 13X3.5XNS CORT TI (Screw) IMPLANT
SCREW CORT 3.5X12 (Screw) ×6 IMPLANT
SCREW CORT 3.5X13 (Screw) ×3 IMPLANT
SPLINT PLASTER CAST XFAST 5X30 (CAST SUPPLIES) IMPLANT
SPLINT PLASTER XFAST SET 5X30 (CAST SUPPLIES) ×2
SPONGE GAUZE 4X4 12PLY (GAUZE/BANDAGES/DRESSINGS) ×3 IMPLANT
SUT VIC AB 2-0 CT3 27 (SUTURE) ×3 IMPLANT
SUT VICRYL RAPIDE 4/0 PS 2 (SUTURE) ×3 IMPLANT
SYRINGE 10CC LL (SYRINGE) ×2 IMPLANT
TOWEL OR 17X24 6PK STRL BLUE (TOWEL DISPOSABLE) ×3 IMPLANT
TUBE CONNECTING 12'X1/4 (SUCTIONS) ×1
TUBE CONNECTING 12X1/4 (SUCTIONS) ×2 IMPLANT
UNDERPAD 30X30 INCONTINENT (UNDERPADS AND DIAPERS) ×3 IMPLANT

## 2014-05-07 NOTE — Anesthesia Preprocedure Evaluation (Signed)
Anesthesia Evaluation  Patient identified by MRN, date of birth, ID band Patient awake    Reviewed: Allergy & Precautions, H&P , NPO status , Patient's Chart, lab work & pertinent test results, reviewed documented beta blocker date and time   Airway Mallampati: II TM Distance: >3 FB Neck ROM: full    Dental   Pulmonary shortness of breath, former smoker,  breath sounds clear to auscultation        Cardiovascular hypertension, On Medications and On Home Beta Blockers +CHF + dysrhythmias Ventricular Fibrillation + pacemaker + Cardiac Defibrillator Rhythm:regular     Neuro/Psych PSYCHIATRIC DISORDERS negative neurological ROS     GI/Hepatic GERD-  Medicated and Controlled,(+)     substance abuse  cocaine use,   Endo/Other  negative endocrine ROS  Renal/GU negative Renal ROS  negative genitourinary   Musculoskeletal   Abdominal   Peds  Hematology  (+) anemia ,   Anesthesia Other Findings See surgeon's H&P   Reproductive/Obstetrics negative OB ROS                           Anesthesia Physical Anesthesia Plan  ASA: III  Anesthesia Plan: General   Post-op Pain Management:    Induction: Intravenous  Airway Management Planned: LMA  Additional Equipment:   Intra-op Plan:   Post-operative Plan: Extubation in OR  Informed Consent: I have reviewed the patients History and Physical, chart, labs and discussed the procedure including the risks, benefits and alternatives for the proposed anesthesia with the patient or authorized representative who has indicated his/her understanding and acceptance.   Dental Advisory Given  Plan Discussed with: CRNA and Surgeon  Anesthesia Plan Comments:         Anesthesia Quick Evaluation

## 2014-05-07 NOTE — Transfer of Care (Signed)
Immediate Anesthesia Transfer of Care Note  Patient: Angela Payne  Procedure(s) Performed: Procedure(s): OPEN REDUCTION INTERNAL FIXATION (ORIF) DISTAL RADIAL FRACTURE (Left)  Patient Location: PACU  Anesthesia Type:General  Level of Consciousness: awake, sedated and confused  Airway & Oxygen Therapy: Patient Spontanous Breathing  Post-op Assessment: Report given to PACU RN, Post -op Vital signs reviewed and stable and Patient moving all extremities  Post vital signs: Reviewed and stable  Complications: No apparent anesthesia complications

## 2014-05-07 NOTE — Anesthesia Postprocedure Evaluation (Signed)
Anesthesia Post Note  Patient: Angela Payne  Procedure(s) Performed: Procedure(s) (LRB): OPEN REDUCTION INTERNAL FIXATION (ORIF) DISTAL RADIAL FRACTURE (Left)  Anesthesia type: General  Patient location: PACU  Post pain: Pain level controlled  Post assessment: Patient's Cardiovascular Status Stable  Last Vitals:  Filed Vitals:   05/07/14 1130  BP: 137/89  Pulse: 79  Temp:   Resp: 12    Post vital signs: Reviewed and stable  Level of consciousness: alert  Complications: No apparent anesthesia complications

## 2014-05-07 NOTE — Interval H&P Note (Signed)
History and Physical Interval Note:  05/07/2014 9:54 AM  Angela Payne  has presented today for surgery, with the diagnosis of LEFT DISTAL RADIUS FRACTURE  The various methods of treatment have been discussed with the patient and family. After consideration of risks, benefits and other options for treatment, the patient has consented to  Procedure(s) with comments: OPEN REDUCTION INTERNAL FIXATION (ORIF) DISTAL RADIAL FRACTURE (Left) - Also wants preop block as a surgical intervention .  The patient's history has been reviewed, patient examined, no change in status, stable for surgery.  I have reviewed the patient's chart and labs.  Questions were answered to the patient's satisfaction.     Jodi Marble

## 2014-05-07 NOTE — Anesthesia Procedure Notes (Signed)
Date/Time: 05/07/2014 10:16 AM Performed by: Coralee Rud Pre-anesthesia Checklist: Patient identified, Emergency Drugs available, Suction available and Patient being monitored Patient Re-evaluated:Patient Re-evaluated prior to inductionOxygen Delivery Method: Circle system utilized Preoxygenation: Pre-oxygenation with 100% oxygen Intubation Type: IV induction Ventilation: Mask ventilation without difficulty LMA: LMA inserted LMA Size: 4.0 Number of attempts: 1

## 2014-05-07 NOTE — Op Note (Signed)
05/07/2014  11:23 AM  PATIENT:  Angela Payne  53 y.o. female  PRE-OPERATIVE DIAGNOSIS:  Dorsally displaced and tilted left distal radius fracture  POST-OPERATIVE DIAGNOSIS:  Same  PROCEDURE:  ORIF left distal wrist fracture, 25607  SURGEON: Cliffton Asters. Janee Morn, MD  PHYSICIAN ASSISTANT: None  ANESTHESIA:  general  SPECIMENS:  None  DRAINS:   None  PREOPERATIVE INDICATIONS:  Angela Payne is a  53 y.o. female with left distal radius fracture, displaced  The risks benefits and alternatives were discussed with the patient preoperatively including but not limited to the risks of infection, bleeding, nerve injury, cardiopulmonary complications, the need for revision surgery, among others, and the patient verbalized understanding and consented to proceed.  OPERATIVE IMPLANTS: Biomet DVR narrow standard plate, with some of the precontoured adjusted to be more straight  OPERATIVE PROCEDURE: After receiving prophylactic antibiotics, the patient was escorted to the operative theatre and placed in a supine position. General anesthesia was administered.  A surgical "time-out" was performed during which the planned procedure, proposed operative site, and the correct patient identity were compared to the operative consent and agreement confirmed by the circulating nurse according to current facility policy. Following application of a tourniquet to the operative extremity, the exposed skin was pre-scrub with Hibiclens scrub brush and then was prepped with Chloraprep and draped in the usual sterile fashion. The limb was exsanguinated with an Esmarch bandage and the tourniquet inflated to approximately higher than systolic BP.   A sinusoidal-shaped incision was marked and made over the FCR axis and the distal forearm. The skin was incised sharply with scalpel, subcutaneous tissues with blunt and spreading dissection. The FCR axis was exploited deeply. The pronator quadratus was reflected in an  L-shaped ulnarly and the brachioradialis was split in a Z-plasty fashion for later reapproximation. The fracture was inspected and provisionally reduced.  This was confirmed fluoroscopically. The appropriately sized plate was selected and found not to fit well. There was some thickening of the distal anterior shaft of the radius and when the radius was manipulated to what appeared consistent with its pre-fracture shape, the plate had too much pre-bend in it. The plate was then recontoured to be more straight such that it would benefit the volar surface of the radius better.  It was placed in its provisional alignment of the radius and this was confirmed fluoroscopically.  It was secured to the radius with a screw through the slotted hole.  Additional adjustments were made as necessary, and the distal holes were all drilled and filled.  Peg/screw length distally was selected on the shorter side of measurements to minimize the risk for dorsal cortical penetration. The remainder of the proximal holes were drilled and filled.   Final images were obtained and the DRUJ was examined for stability. It was found to be sufficiently stable. The wound was then copiously irrigated and the brachioradialis repaired with 2-0 Vicryl Rapide suture followed by repair of the pronator quadratus with the same suture type. Tourniquet was released and additional hemostasis obtained and the skin was closed with 2-0 Vicryl deep dermal buried sutures followed by running 4-0 Vicryl Rapide horizontal mattress suture in the skin. A bulky dressing with a volar plaster component was applied and she was taken to room stable condition.  DISPOSITION: The patient will be discharged home today with typical post-op instructions, returning in 7-10 days for reevaluation with new x-rays of the affected wrist out of the splint to include an inclined lateral and  then be placed into a removable Velcro wrist splint.

## 2014-05-07 NOTE — Discharge Instructions (Signed)
Discharge Instructions ° ° °You have a dressing with a plaster splint incorporated in it. °Move your fingers as much as possible, making a full fist and fully opening the fist. °Elevate your hand to reduce pain & swelling of the digits.  Ice over the operative site may be helpful to reduce pain & swelling.  DO NOT USE HEAT. °Pain medicine has been prescribed for you.  °Use your medicine as needed over the first 48 hours, and then you can begin to taper your use.  You may use Tylenol in place of your prescribed pain medication, but not IN ADDITION to it. °Leave the dressing in place until you return to our office.  °You may shower, but keep the bandage clean & dry.  °You may drive a car when you are off of prescription pain medications and can safely control your vehicle with both hands. ° ° °Please call [phone] during normal business hours or [phone] after hours for any problems. Including the following: ° °- excessive redness of the incisions °- drainage for more than 4 days °- fever of more than 101.5 F ° °*Please note that pain medications will not be refilled after hours or on weekends. ° °What to eat: ° °For your first meals, you should eat lightly; only small meals initially.  If you do not have nausea, you may eat larger meals.  Avoid spicy, greasy and heavy food.   ° °General Anesthesia, Adult, Care After  °Refer to this sheet in the next few weeks. These instructions provide you with information on caring for yourself after your procedure. Your health care provider may also give you more specific instructions. Your treatment has been planned according to current medical practices, but problems sometimes occur. Call your health care provider if you have any problems or questions after your procedure.  °WHAT TO EXPECT AFTER THE PROCEDURE  °After the procedure, it is typical to experience:  °Sleepiness.  °Nausea and vomiting. °HOME CARE INSTRUCTIONS  °For the first 24 hours after general anesthesia:   °Have a responsible person with you.  °Do not drive a car. If you are alone, do not take public transportation.  °Do not drink alcohol.  °Do not take medicine that has not been prescribed by your health care provider.  °Do not sign important papers or make important decisions.  °You may resume a normal diet and activities as directed by your health care provider.  °Change bandages (dressings) as directed.  °If you have questions or problems that seem related to general anesthesia, call the hospital and ask for the anesthetist or anesthesiologist on call. °SEEK MEDICAL CARE IF:  °You have nausea and vomiting that continue the day after anesthesia.  °You develop a rash. °SEEK IMMEDIATE MEDICAL CARE IF:  °You have difficulty breathing.  °You have chest pain.  °You have any allergic problems. °Document Released: 02/22/2001 Document Revised: 07/19/2013 Document Reviewed: 06/01/2013  °[employer]® Patient Information ©2014 [employer].  ° ° ° °

## 2014-05-07 NOTE — Progress Notes (Signed)
Per Dr. Gelene Mink he would like device rep. to come in. Spoke to [employer] who advised he would send someone in for same.

## 2014-05-07 NOTE — Progress Notes (Signed)
Spoke to lab they are going to run UDS now as it was not initially marked as collected.

## 2014-05-08 ENCOUNTER — Encounter (HOSPITAL_COMMUNITY): Payer: Self-pay | Admitting: Orthopedic Surgery

## 2014-05-09 ENCOUNTER — Ambulatory Visit
Admission: RE | Admit: 2014-05-09 | Discharge: 2014-05-09 | Disposition: A | Discharge status: Home / Self Care | Payer: Medicaid Other | Source: Ambulatory Visit | Attending: Obstetrics & Gynecology | Admitting: Obstetrics & Gynecology

## 2014-05-09 ENCOUNTER — Encounter (INDEPENDENT_AMBULATORY_CARE_PROVIDER_SITE_OTHER): Payer: Self-pay

## 2014-05-09 DIAGNOSIS — N6310 Unspecified lump in the right breast, unspecified quadrant: Secondary | ICD-10-CM

## 2014-05-18 ENCOUNTER — Ambulatory Visit: Payer: Medicaid Other | Admitting: Cardiovascular Disease

## 2014-05-25 ENCOUNTER — Encounter: Payer: Medicaid Other | Admitting: Obstetrics and Gynecology

## 2014-05-30 ENCOUNTER — Encounter: Payer: Self-pay | Admitting: Cardiovascular Disease

## 2014-05-30 ENCOUNTER — Ambulatory Visit (INDEPENDENT_AMBULATORY_CARE_PROVIDER_SITE_OTHER): Payer: Medicaid Other | Admitting: Cardiovascular Disease

## 2014-05-30 VITALS — BP 122/84 | HR 66 | Ht 63.0 in | Wt 147.8 lb

## 2014-05-30 DIAGNOSIS — Z8674 Personal history of sudden cardiac arrest: Secondary | ICD-10-CM

## 2014-05-30 DIAGNOSIS — I509 Heart failure, unspecified: Secondary | ICD-10-CM

## 2014-05-30 DIAGNOSIS — Z95810 Presence of automatic (implantable) cardiac defibrillator: Secondary | ICD-10-CM

## 2014-05-30 DIAGNOSIS — I5022 Chronic systolic (congestive) heart failure: Secondary | ICD-10-CM

## 2014-05-30 DIAGNOSIS — I480 Paroxysmal atrial fibrillation: Secondary | ICD-10-CM

## 2014-05-30 DIAGNOSIS — I428 Other cardiomyopathies: Secondary | ICD-10-CM

## 2014-05-30 DIAGNOSIS — I4891 Unspecified atrial fibrillation: Secondary | ICD-10-CM

## 2014-05-30 NOTE — Progress Notes (Signed)
History of Present Illness: 53 yo AAF with h/o NICM, prior VF arrest August 2011, PAF here today for cardiac follow up. She was admitted to [facility] August 2011 after an out of hospital V. Fib arrest. She was cooled on the [place] sun protocol and underwent a cardiac cath which showed no evidence of obstructive CAD. Echo showed EF of 15%. Cardiac MRI showed EF of 29%. 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 She had an ICD implanted by Dr. Graciela Husbands on July 18, 2010. She has had chronic chest pain since her cardiac arrest. Echo May 2014 with LVEF=50%. She has been seen in the CHF clinic and has been released.   She is here for folllow up. She describes overall lack of energy but unchanged. No exertional chest pain. Weight is stable. No near syncope, syncope, dizziness, orthopnea or PND. She has been taking all of her medications. She uses an extra Lasix if needed for LE swelling. She fell on 05/04/14 and broke her left wrist. She required surgery.   Primary Care Physician: [facility]  Last Lipid Profile:Lipid Panel     Component Value Date/Time   CHOL 179 11/10/2012 1402   TRIG 97.0 11/10/2012 1402   HDL 34.80* 11/10/2012 1402   CHOLHDL 5 11/10/2012 1402   VLDL 19.4 11/10/2012 1402   LDLCALC 125* 11/10/2012 1402     Past Medical History  Diagnosis Date  . Other left bundle branch block   . Automatic implantable cardiac defibrillator in situ     a. 06/2010 s/p  s/p SJM 3231-40 Bi V ICD Ser # [id number].  Marland Kitchen Chronic systolic heart failure   . Anemia, unspecified   . Paroxysmal supraventricular tachycardia   . Nonischemic cardiomyopathy     a. 06/2010 OOH VF arrest;  b. 06/2010 Cath: nonobs dzs, EF 15-20%, glob HK;  c. 06/2010 Echo: EF 15%, diff HK, Gr 2 DD;  d. 06/2010 Cardiac MRI EF of 29%, no scar;  e. 06/2010 s/p SJM 3231-40 Bi V ICD Ser # [id number].  Marland Kitchen Atrial fibrillation     a. This occurred in setting of resuscitation  efforts during VF arrest.  . Cardiac arrest     a. 06/2010  . History of tobacco abuse   . History of cocaine abuse     remote  . Thyroid disease   . SUBSTANCE ABUSE, MULTIPLE 08/13/2010    Qualifier: Diagnosis of  By: Denyse Amass CMA, Carol    . Hypertension   . Depression     ok  . CHF (congestive heart failure)   . Shortness of breath     with exertion  . Pacemaker   . Automatic implantable cardioverter-defibrillator in situ   . GERD (gastroesophageal reflux disease)     Past Surgical History  Procedure Laterality Date  . Cardiac defibrillator placement      [facility] [place] CD [id number] Q defib, serial number [id number]  . Cardiac defibrillator placement    . Open reduction internal fixation (orif) distal radial fracture Left 05/07/2014    Procedure: OPEN REDUCTION INTERNAL FIXATION (ORIF) DISTAL RADIAL FRACTURE;  Surgeon: Jodi Marble, MD;  Location: [facility] OR;  Service: Orthopedics;  Laterality: Left;    Current Outpatient Prescriptions  Medication Sig Dispense Refill  . albuterol (PROVENTIL HFA;VENTOLIN HFA) 108 (90 BASE) MCG/ACT inhaler Inhale 2 puffs into the lungs every 4 (four) hours as needed. For shortness of breath or wheezing      .  aspirin 81 MG chewable tablet Chew 81 mg by mouth daily.      . carvedilol (COREG) 25 MG tablet Take 0.5 tablets (12.5 mg total) by mouth 2 (two) times daily with a meal.  30 tablet  1  . diphenhydrAMINE (BENADRYL) 25 MG tablet Take 1 tablet (25 mg total) by mouth every 6 (six) hours as needed for itching (Rash).  30 tablet  0  . furosemide (LASIX) 40 MG tablet Take 1 tablet (40 mg total) by mouth daily.  30 tablet  3  . ibuprofen (ADVIL,MOTRIN) 200 MG tablet Take 200 mg by mouth every 6 (six) hours as needed for mild pain.      . isosorbide-hydrALAZINE (BIDIL) 20-37.5 MG per tablet Take 1 tablet by mouth 2 (two) times daily.  180 tablet  3  . lisinopril (PRINIVIL,ZESTRIL) 5 MG tablet Take 5 mg by mouth daily.      . Multiple Vitamin (MULTIVITAMIN WITH  MINERALS) TABS Take 1 tablet by mouth daily.      Marland Kitchen. oxyCODONE-acetaminophen (ROXICET) 5-325 MG per tablet Take 1-2 tablets by mouth every 4 (four) hours as needed.  80 tablet  0  . polyethylene glycol (MIRALAX / GLYCOLAX) packet Take 17 g by mouth daily as needed (constipation).       . potassium chloride SA (K-DUR,KLOR-CON) 20 MEQ tablet Take 20 mEq by mouth daily.      . ranitidine (ZANTAC) 150 MG tablet Take 150 mg by mouth daily as needed for heartburn.      . traMADol (ULTRAM) 50 MG tablet Take 1 tablet (50 mg total) by mouth every 6 (six) hours as needed. FOR PAIN  30 tablet  0   No current facility-administered medications for this visit.    No Known Allergies  History   Social History  . Marital Status: Married    Spouse Name: N/A    Number of Children: 4  . Years of Education: N/A   Occupational History  . Disablity    Social History Main Topics  . Smoking status: Former Smoker    Quit date: 07/28/2011  . Smokeless tobacco: Never Used  . Alcohol Use: No  . Drug Use: No     Comment: history of cocaine abuse last used 2008  . Sexual Activity: Yes    Birth Control/ Protection: IUD   Other Topics Concern  . Not on file   Social History Narrative  . No narrative on file    Family History  Problem Relation Age of Onset  . Heart disease Father   . Hypertension Father   . Hypertension Mother   . Heart disease Mother     Review of Systems:  As stated in the HPI and otherwise negative.   BP 122/84  Pulse 66  Ht 5\' 3"  (1.6 m)  Wt 147 lb 12.8 oz (67.042 kg)  BMI 26.19 kg/m2  Physical Examination: General: Well developed, well nourished, NAD HEENT: OP clear, mucus membranes moist SKIN: warm, dry. No rashes. Neuro: No focal deficits Musculoskeletal: Muscle strength 5/5 all ext Psychiatric: Mood and affect normal Neck: No JVD, no carotid bruits, no thyromegaly, no lymphadenopathy. Lungs:Clear bilaterally, no wheezes, rhonci, crackles Cardiovascular: Regular  rate and rhythm. No murmurs, gallops or rubs. Abdomen:Soft. Bowel sounds present. Non-tender.  Extremities: No lower extremity edema. Pulses are 2 + in the bilateral DP/PT.  Echo May 2014: Left ventricle: The cavity size was normal. Wall thickness was normal. Systolic function was mildly to moderately reduced. The estimated ejection  fraction was in the range of 40% to 45% (50% by overread of Dr. Gala Romney). Features are consistent with a pseudonormal left ventricular filling pattern, with concomitant abnormal relaxation and increased filling pressure (grade 2 diastolic dysfunction). - Pulmonary arteries: PA peak pressure: 75mm Hg (S).  Assessment and Plan:   1. Non ischemic cardiomyopathy: Echo May 2014 with LVEF=50% per Dr. Gala Romney.  She is on good medical therapy. Blood pressure is well controlled. No changes in therapy today.   2. SYSTOLIC HEART FAILURE, CHRONIC: Volume status is normal today. Weight has been stable.Continue to follow daily weights at home.  Will continue Lasix daily and potassium supplementation. Dr. Graciela Husbands follows her ICD.   3. PAF: No recurrence.   4. History of cardiac arrest: ICD in place.

## 2014-05-30 NOTE — Patient Instructions (Signed)
Your physician wants you to follow-up in:  6 months. You will receive a reminder letter in the mail two months in advance. If you don't receive a letter, please call our office to schedule the follow-up appointment.   

## 2014-06-02 ENCOUNTER — Emergency Department (HOSPITAL_COMMUNITY)
Admission: EM | Admit: 2014-06-02 | Discharge: 2014-06-02 | Disposition: A | Discharge status: Home / Self Care | Payer: No Typology Code available for payment source | Attending: Emergency Medicine | Admitting: Emergency Medicine

## 2014-06-02 ENCOUNTER — Encounter (HOSPITAL_COMMUNITY): Payer: Self-pay | Admitting: Emergency Medicine

## 2014-06-02 DIAGNOSIS — Z950 Presence of cardiac pacemaker: Secondary | ICD-10-CM | POA: Diagnosis not present

## 2014-06-02 DIAGNOSIS — I10 Essential (primary) hypertension: Secondary | ICD-10-CM | POA: Diagnosis not present

## 2014-06-02 DIAGNOSIS — Z79899 Other long term (current) drug therapy: Secondary | ICD-10-CM | POA: Diagnosis not present

## 2014-06-02 DIAGNOSIS — M436 Torticollis: Secondary | ICD-10-CM | POA: Insufficient documentation

## 2014-06-02 DIAGNOSIS — S199XXA Unspecified injury of neck, initial encounter: Principal | ICD-10-CM

## 2014-06-02 DIAGNOSIS — I471 Supraventricular tachycardia, unspecified: Secondary | ICD-10-CM | POA: Insufficient documentation

## 2014-06-02 DIAGNOSIS — F3289 Other specified depressive episodes: Secondary | ICD-10-CM | POA: Diagnosis not present

## 2014-06-02 DIAGNOSIS — F329 Major depressive disorder, single episode, unspecified: Secondary | ICD-10-CM | POA: Diagnosis not present

## 2014-06-02 DIAGNOSIS — I4891 Unspecified atrial fibrillation: Secondary | ICD-10-CM | POA: Diagnosis not present

## 2014-06-02 DIAGNOSIS — Y92410 Unspecified street and highway as the place of occurrence of the external cause: Secondary | ICD-10-CM | POA: Insufficient documentation

## 2014-06-02 DIAGNOSIS — K219 Gastro-esophageal reflux disease without esophagitis: Secondary | ICD-10-CM | POA: Diagnosis not present

## 2014-06-02 DIAGNOSIS — Z862 Personal history of diseases of the blood and blood-forming organs and certain disorders involving the immune mechanism: Secondary | ICD-10-CM | POA: Insufficient documentation

## 2014-06-02 DIAGNOSIS — M542 Cervicalgia: Secondary | ICD-10-CM

## 2014-06-02 DIAGNOSIS — Z95810 Presence of automatic (implantable) cardiac defibrillator: Secondary | ICD-10-CM | POA: Insufficient documentation

## 2014-06-02 DIAGNOSIS — Z791 Long term (current) use of non-steroidal anti-inflammatories (NSAID): Secondary | ICD-10-CM | POA: Diagnosis not present

## 2014-06-02 DIAGNOSIS — Z87891 Personal history of nicotine dependence: Secondary | ICD-10-CM | POA: Diagnosis not present

## 2014-06-02 DIAGNOSIS — Z9861 Coronary angioplasty status: Secondary | ICD-10-CM | POA: Diagnosis not present

## 2014-06-02 DIAGNOSIS — S0993XA Unspecified injury of face, initial encounter: Secondary | ICD-10-CM | POA: Diagnosis present

## 2014-06-02 DIAGNOSIS — Y9389 Activity, other specified: Secondary | ICD-10-CM | POA: Insufficient documentation

## 2014-06-02 DIAGNOSIS — Z8639 Personal history of other endocrine, nutritional and metabolic disease: Secondary | ICD-10-CM | POA: Insufficient documentation

## 2014-06-02 MED ORDER — NAPROXEN 500 MG PO TABS: 500.0000 mg | ORAL_TABLET | Freq: Two times a day (BID) | ORAL | Status: DC

## 2014-06-02 MED ORDER — METHOCARBAMOL 500 MG PO TABS: 500.0000 mg | ORAL_TABLET | Freq: Two times a day (BID) | ORAL | Status: DC

## 2014-06-02 MED ORDER — OXYCODONE-ACETAMINOPHEN 5-325 MG PO TABS: 1.0000 | ORAL_TABLET | Freq: Four times a day (QID) | ORAL | Status: DC | PRN
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
## 2014-06-03 NOTE — ED Provider Notes (Signed)
Medical screening examination/treatment/procedure(s) were performed by non-physician practitioner and as supervising physician I was immediately available for consultation/collaboration.  Shanna Cisco, MD 06/03/14 1218

## 2014-06-22 ENCOUNTER — Other Ambulatory Visit: Payer: Self-pay | Admitting: Nurse Practitioner

## 2014-06-22 DIAGNOSIS — T8332XA Displacement of intrauterine contraceptive device, initial encounter: Secondary | ICD-10-CM

## 2014-06-25 ENCOUNTER — Other Ambulatory Visit: Payer: Medicaid Other

## 2014-06-27 ENCOUNTER — Other Ambulatory Visit: Payer: Medicaid Other

## 2014-07-04 ENCOUNTER — Encounter: Payer: Medicaid Other | Admitting: Internal Medicine

## 2014-07-05 ENCOUNTER — Other Ambulatory Visit: Payer: Self-pay | Admitting: Cardiology

## 2014-07-06 ENCOUNTER — Encounter: Payer: Self-pay | Admitting: Internal Medicine

## 2014-07-10 ENCOUNTER — Other Ambulatory Visit: Payer: Medicaid Other

## 2014-08-08 ENCOUNTER — Emergency Department (HOSPITAL_COMMUNITY)
Admission: EM | Admit: 2014-08-08 | Discharge: 2014-08-08 | Disposition: A | Discharge status: Home / Self Care | Payer: Medicaid Other | Source: Home / Self Care | Attending: Family Medicine | Admitting: Family Medicine

## 2014-08-08 ENCOUNTER — Encounter (HOSPITAL_COMMUNITY): Payer: Self-pay | Admitting: Family Medicine

## 2014-08-08 DIAGNOSIS — A088 Other specified intestinal infections: Secondary | ICD-10-CM

## 2014-08-08 DIAGNOSIS — I509 Heart failure, unspecified: Secondary | ICD-10-CM

## 2014-08-08 DIAGNOSIS — H60399 Other infective otitis externa, unspecified ear: Secondary | ICD-10-CM

## 2014-08-08 DIAGNOSIS — A084 Viral intestinal infection, unspecified: Secondary | ICD-10-CM

## 2014-08-08 DIAGNOSIS — I5022 Chronic systolic (congestive) heart failure: Secondary | ICD-10-CM

## 2014-08-08 DIAGNOSIS — H6091 Unspecified otitis externa, right ear: Secondary | ICD-10-CM

## 2014-08-08 MED ORDER — ONDANSETRON 4 MG PO TBDP: 4.0000 mg | ORAL_TABLET | Freq: Three times a day (TID) | ORAL | Status: DC | PRN

## 2014-08-08 MED ORDER — CIPROFLOXACIN-DEXAMETHASONE 0.3-0.1 % OT SUSP: 4.0000 [drp] | Freq: Two times a day (BID) | OTIC | Status: DC

## 2014-08-08 MED ORDER — ANTIPYRINE-BENZOCAINE 5.4-1.4 % OT SOLN: 3.0000 [drp] | OTIC | Status: DC | PRN

## 2014-08-08 NOTE — ED Provider Notes (Signed)
CSN: [id number]     Arrival date & time 08/08/14  0800 History   First MD Initiated Contact with Patient 08/08/14 0815     Chief Complaint  Patient presents with  . Otalgia   (Consider location/radiation/quality/duration/timing/severity/associated sxs/prior Treatment) HPI  R ear pain: feels clogged. Associated r/ R temple HA, nausea. Started 2 days ago. No change. Tried rinsing it out w/ peroxide w/o benefit. Drainage.   Diarrhea started 2 days ago. Denies sick contacts. Diarrhea 4-5 x daily. Associated w/ meals. Non bloody or mucus.   CHF: denies any increased SOB, LE swelling, or CP.. Wt loss during this time.  Past Medical History  Diagnosis Date  . Other left bundle branch block   . Automatic implantable cardiac defibrillator in situ     a. 06/2010 s/p  s/p SJM 3231-40 Bi V ICD Ser # [id number].  Marland Kitchen Chronic systolic heart failure   . Anemia, unspecified   . Paroxysmal supraventricular tachycardia   . Nonischemic cardiomyopathy     a. 06/2010 OOH VF arrest;  b. 06/2010 Cath: nonobs dzs, EF 15-20%, glob HK;  c. 06/2010 Echo: EF 15%, diff HK, Gr 2 DD;  d. 06/2010 Cardiac MRI EF of 29%, no scar;  e. 06/2010 s/p SJM 3231-40 Bi V ICD Ser # [id number].  Marland Kitchen Atrial fibrillation     a. This occurred in setting of resuscitation efforts during VF arrest.  . Cardiac arrest     a. 06/2010  . History of tobacco abuse   . History of cocaine abuse     remote  . Thyroid disease   . SUBSTANCE ABUSE, MULTIPLE 08/13/2010    Qualifier: Diagnosis of  By: Denyse Amass CMA, Carol    . Hypertension   . Depression     ok  . CHF (congestive heart failure)   . Shortness of breath     with exertion  . Pacemaker   . Automatic implantable cardioverter-defibrillator in situ   . GERD (gastroesophageal reflux disease)    Past Surgical History  Procedure Laterality Date  . Cardiac defibrillator placement      [facility] CD [id number] Q defib, serial number [id number]  . Cardiac defibrillator placement    . Open reduction  internal fixation (orif) distal radial fracture Left 05/07/2014    Procedure: OPEN REDUCTION INTERNAL FIXATION (ORIF) DISTAL RADIAL FRACTURE;  Surgeon: Jodi Marble, MD;  Location: [facility] OR;  Service: Orthopedics;  Laterality: Left;   Family History  Problem Relation Age of Onset  . Heart disease Father   . Hypertension Father   . Hypertension Mother   . Heart disease Mother    History  Substance Use Topics  . Smoking status: Former Smoker    Quit date: 07/28/2011  . Smokeless tobacco: Never Used  . Alcohol Use: No   OB History   Grav Para Term Preterm Abortions TAB SAB Ect Mult Living   Review of Systems Per HPI with all other pertinent systems negative.   Allergies  Review of patient's allergies indicates no known allergies.  Home Medications   Prior to Admission medications   Medication Sig Start Date End Date Taking? Authorizing Provider  aspirin 81 MG chewable tablet Chew 81 mg by mouth daily.   Yes Historical Provider, MD  carvedilol (COREG) 25 MG tablet Take 0.5 tablets (12.5 mg total) by mouth 2 (two) times daily with a meal. 04/24/14  Yes Kathleene Hazel,  MD  potassium chloride SA (K-DUR,KLOR-CON) 20 MEQ tablet Take 20 mEq by mouth daily.   Yes Historical Provider, MD  albuterol (PROVENTIL HFA;VENTOLIN HFA) 108 (90 BASE) MCG/ACT inhaler Inhale 2 puffs into the lungs every 4 (four) hours as needed. For shortness of breath or wheezing    Historical Provider, MD  antipyrine-benzocaine Lyla Son) otic solution Place 3-4 drops into the right ear every 2 (two) hours as needed for ear pain. 08/08/14   Ozella Rocks, MD  ciprofloxacin-dexamethasone [facility]) otic suspension Place 4 drops into the right ear 2 (two) times daily. Treat for 5-7 days 08/08/14   Ozella Rocks, MD  diphenhydrAMINE (BENADRYL) 25 MG tablet Take 1 tablet (25 mg total) by mouth every 6 (six) hours as needed for itching (Rash). 05/27/13   Antony Madura, PA-C  furosemide (LASIX) 40 MG  tablet Take 1 tablet (40 mg total) by mouth daily. 03/28/13   Dolores Patty, MD  ibuprofen (ADVIL,MOTRIN) 200 MG tablet Take 200 mg by mouth every 6 (six) hours as needed for mild pain.    Historical Provider, MD  isosorbide-hydrALAZINE (BIDIL) 20-37.5 MG per tablet Take 1 tablet by mouth 2 (two) times daily. 11/10/12   Duke Salvia, MD  lisinopril (PRINIVIL,ZESTRIL) 5 MG tablet Take 5 mg by mouth daily.    Historical Provider, MD  methocarbamol (ROBAXIN) 500 MG tablet Take 1 tablet (500 mg total) by mouth 2 (two) times daily. 06/02/14   Mercedes Strupp Camprubi-Soms, PA-C  Multiple Vitamin (MULTIVITAMIN WITH MINERALS) TABS Take 1 tablet by mouth daily.    Historical Provider, MD  naproxen (NAPROSYN) 500 MG tablet Take 1 tablet (500 mg total) by mouth 2 (two) times daily. 06/02/14   Mercedes Strupp Camprubi-Soms, PA-C  ondansetron (ZOFRAN-ODT) 4 MG disintegrating tablet Take 1 tablet (4 mg total) by mouth every 8 (eight) hours as needed for nausea or vomiting. 08/08/14   Ozella Rocks, MD  oxyCODONE-acetaminophen (PERCOCET) 5-325 MG per tablet Take 1-2 tablets by mouth every 6 (six) hours as needed for severe pain. 06/02/14   Mercedes Strupp Camprubi-Soms, PA-C  oxyCODONE-acetaminophen (ROXICET) 5-325 MG per tablet Take 1-2 tablets by mouth every 4 (four) hours as needed. 05/07/14   Jodi Marble, MD  polyethylene glycol [facility] / Ethelene Hal) packet Take 17 g by mouth daily as needed (constipation).  10/28/12   Rolan Bucco, MD  potassium chloride SA (K-DUR,KLOR-CON) 20 MEQ tablet take 1 tablet by mouth twice a day    Kathleene Hazel, MD  ranitidine (ZANTAC) 150 MG tablet Take 150 mg by mouth daily as needed for heartburn.    Historical Provider, MD  traMADol (ULTRAM) 50 MG tablet Take 1 tablet (50 mg total) by mouth every 6 (six) hours as needed. FOR PAIN 01/07/14   Linwood Dibbles, MD   BP 128/84  Pulse 100  Temp(Src) 97.7 F (36.5 C) (Oral)  Resp 12  SpO2 97% Physical Exam  Constitutional:  She is oriented to person, place, and time. She appears well-developed and well-nourished. No distress.  HENT:  l and R TM nml R canal w/ posterior edema and minimal skin weeping.  TM joint nml   Eyes: EOM are normal. Pupils are equal, round, and reactive to light.  Neck: Normal range of motion.  Cardiovascular: Normal rate and intact distal pulses.   II/VI systolic murmur  Pulmonary/Chest: Effort normal. No respiratory distress.  Abdominal: Soft. She exhibits no distension. There is no tenderness. There is no rebound.  Musculoskeletal: Normal range of  motion. She exhibits no edema.  Neurological: She is alert and oriented to person, place, and time.  Skin: Skin is warm and dry. No rash noted. She is not diaphoretic. No erythema.  Psychiatric: Her behavior is normal. Judgment and thought content normal.    ED Course  Procedures (including critical care time) Labs Review Labs Reviewed - No data to display  Imaging Review No results found.   MDM   1. Otitis externa, right   2. Chronic systolic congestive heart failure   3. Viral gastroenteritis    Ciprodex adn auralgan  Fluids (careful not to overhydrate given EF of 40-55%), rest zofran nausea PRN ibuprofen for uncontrolled pain Precautions given and all questions answered  Shelly Flatten, MD Family Medicine 08/08/2014, 8:36 AM      Ozella Rocks, MD 08/08/14 [phone]

## 2014-08-08 NOTE — ED Notes (Signed)
C/o right ear pain onset 2 days Sx also include: nauseas, diarrhea and HA Denies f/v Alert, no signs of acute distress.

## 2014-08-08 NOTE — Discharge Instructions (Signed)
You are likely suffering from a viral gut infection called viral gastroenteritis. This will improve with time.  Please use the zofran for nausea. Stay well hydrated but be careful not to take in too much so as not to strain your heart Please start the ciprodex for the ear infection and auralgan for ear pain Please use ibuprofen 400-600mg  every 6 hours as needed for the pain if the auralgan does not take it away Please go to the emergency room if you get worse.   Otitis Externa Otitis externa is a bacterial or fungal infection of the outer ear canal. This is the area from the eardrum to the outside of the ear. Otitis externa is sometimes called "swimmer's ear." CAUSES  Possible causes of infection include:  Swimming in dirty water.  Moisture remaining in the ear after swimming or bathing.  Mild injury (trauma) to the ear.  Objects stuck in the ear (foreign body).  Cuts or scrapes (abrasions) on the outside of the ear. SIGNS AND SYMPTOMS  The first symptom of infection is often itching in the ear canal. Later signs and symptoms may include swelling and redness of the ear canal, ear pain, and yellowish-white fluid (pus) coming from the ear. The ear pain may be worse when pulling on the earlobe. DIAGNOSIS  Your health care provider will perform a physical exam. A sample of fluid may be taken from the ear and examined for bacteria or fungi. TREATMENT  Antibiotic ear drops are often given for 10 to 14 days. Treatment may also include pain medicine or corticosteroids to reduce itching and swelling. HOME CARE INSTRUCTIONS   Apply antibiotic ear drops to the ear canal as prescribed by your health care provider.  Take medicines only as directed by your health care provider.  If you have diabetes, follow any additional treatment instructions from your health care provider.  Keep all follow-up visits as directed by your health care provider. PREVENTION   Keep your ear dry. Use the corner of  a towel to absorb water out of the ear canal after swimming or bathing.  Avoid scratching or putting objects inside your ear. This can damage the ear canal or remove the protective wax that lines the canal. This makes it easier for bacteria and fungi to grow.  Avoid swimming in lakes, polluted water, or poorly chlorinated pools.  You may use ear drops made of rubbing alcohol and vinegar after swimming. Combine equal parts of white vinegar and alcohol in a bottle. Put 3 or 4 drops into each ear after swimming. SEEK MEDICAL CARE IF:   You have a fever.  Your ear is still red, swollen, painful, or draining pus after 3 days.  Your redness, swelling, or pain gets worse.  You have a severe headache.  You have redness, swelling, pain, or tenderness in the area behind your ear. MAKE SURE YOU:   Understand these instructions.  Will watch your condition.  Will get help right away if you are not doing well or get worse. Document Released: 11/16/2005 Document Revised: 04/02/2014 Document Reviewed: 12/03/2011 [facility] Patient Information 2015 [place], [place]. This information is not intended to replace advice given to you by your health care provider. Make sure you discuss any questions you have with your health care provider. Viral Gastroenteritis Viral gastroenteritis is also known as stomach flu. This condition affects the stomach and intestinal tract. It can cause sudden diarrhea and vomiting. The illness typically lasts 3 to 8 days. Most people develop an immune  response that eventually gets rid of the virus. While this natural response develops, the virus can make you quite ill. CAUSES  Many different viruses can cause gastroenteritis, such as rotavirus or noroviruses. You can catch one of these viruses by consuming contaminated food or water. You may also catch a virus by sharing utensils or other personal items with an infected person or by touching a contaminated surface. SYMPTOMS  The  most common symptoms are diarrhea and vomiting. These problems can cause a severe loss of body fluids (dehydration) and a body salt (electrolyte) imbalance. Other symptoms may include:  Fever.  Headache.  Fatigue.  Abdominal pain. DIAGNOSIS  Your caregiver can usually diagnose viral gastroenteritis based on your symptoms and a physical exam. A stool sample may also be taken to test for the presence of viruses or other infections. TREATMENT  This illness typically goes away on its own. Treatments are aimed at rehydration. The most serious cases of viral gastroenteritis involve vomiting so severely that you are not able to keep fluids down. In these cases, fluids must be given through an intravenous line (IV). HOME CARE INSTRUCTIONS   Drink enough fluids to keep your urine clear or pale yellow. Drink small amounts of fluids frequently and increase the amounts as tolerated.  Ask your caregiver for specific rehydration instructions.  Avoid:  Foods high in sugar.  Alcohol.  Carbonated drinks.  Tobacco.  Juice.  Caffeine drinks.  Extremely hot or cold fluids.  Fatty, greasy foods.  Too much intake of anything at one time.  Dairy products until 24 to 48 hours after diarrhea stops.  You may consume probiotics. Probiotics are active cultures of beneficial bacteria. They may lessen the amount and number of diarrheal stools in adults. Probiotics can be found in yogurt with active cultures and in supplements.  Wash your hands well to avoid spreading the virus.  Only take over-the-counter or prescription medicines for pain, discomfort, or fever as directed by your caregiver. Do not give aspirin to children. Antidiarrheal medicines are not recommended.  Ask your caregiver if you should continue to take your regular prescribed and over-the-counter medicines.  Keep all follow-up appointments as directed by your caregiver. SEEK IMMEDIATE MEDICAL CARE IF:   You are unable to keep  fluids down.  You do not urinate at least once every 6 to 8 hours.  You develop shortness of breath.  You notice blood in your stool or vomit. This may look like coffee grounds.  You have abdominal pain that increases or is concentrated in one small area (localized).  You have persistent vomiting or diarrhea.  You have a fever.  The patient is a child younger than 3 months, and he or she has a fever.  The patient is a child older than 3 months, and he or she has a fever and persistent symptoms.  The patient is a child older than 3 months, and he or she has a fever and symptoms suddenly get worse.  The patient is a baby, and he or she has no tears when crying. MAKE SURE YOU:   Understand these instructions.  Will watch your condition.  Will get help right away if you are not doing well or get worse. Document Released: 11/16/2005 Document Revised: 02/08/2012 Document Reviewed: 09/02/2011 [facility] Patient Information 2015 [place], [place]. This information is not intended to replace advice given to you by your health care provider. Make sure you discuss any questions you have with your health care provider.

## 2014-09-19 ENCOUNTER — Other Ambulatory Visit: Payer: Self-pay

## 2014-09-19 MED ORDER — POTASSIUM CHLORIDE CRYS ER 20 MEQ PO TBCR: 20.0000 meq | EXTENDED_RELEASE_TABLET | Freq: Every day | ORAL | Status: DC

## 2014-09-19 MED ORDER — LISINOPRIL 5 MG PO TABS: 5.0000 mg | ORAL_TABLET | Freq: Every day | ORAL | Status: DC

## 2014-09-19 MED ORDER — FUROSEMIDE 40 MG PO TABS: 40.0000 mg | ORAL_TABLET | Freq: Every day | ORAL | Status: DC

## 2014-09-25 ENCOUNTER — Other Ambulatory Visit: Payer: Self-pay | Admitting: *Deleted

## 2014-09-25 MED ORDER — CARVEDILOL 25 MG PO TABS: 12.5000 mg | ORAL_TABLET | Freq: Two times a day (BID) | ORAL | Status: DC

## 2014-10-01 ENCOUNTER — Encounter (HOSPITAL_COMMUNITY): Payer: Self-pay | Admitting: Family Medicine

## 2014-10-23 IMAGING — MG MM DIGITAL SCREENING BILAT
5 series · 5 of 5 positions shown · non-contrast
Comparison: Previous exams.

CLINICAL DATA: Screening.

DIGITAL BILATERAL SCREENING MAMMOGRAM WITH CAD

[R CC]
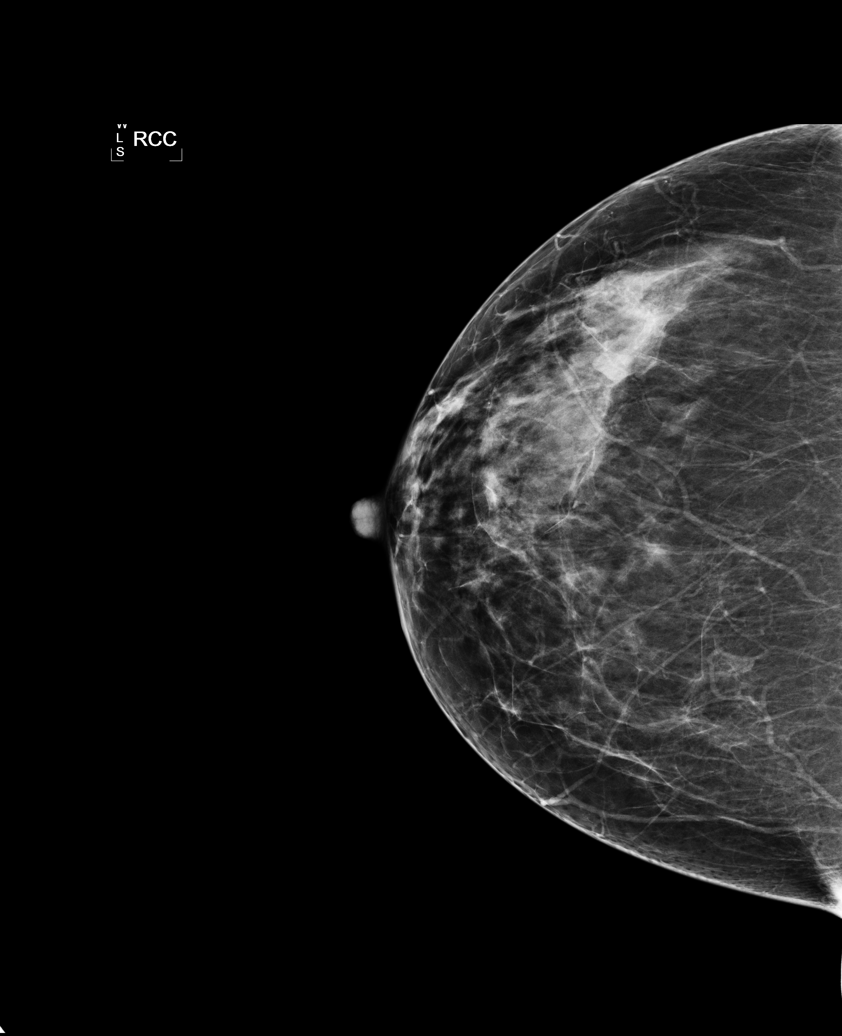

[R MLO]
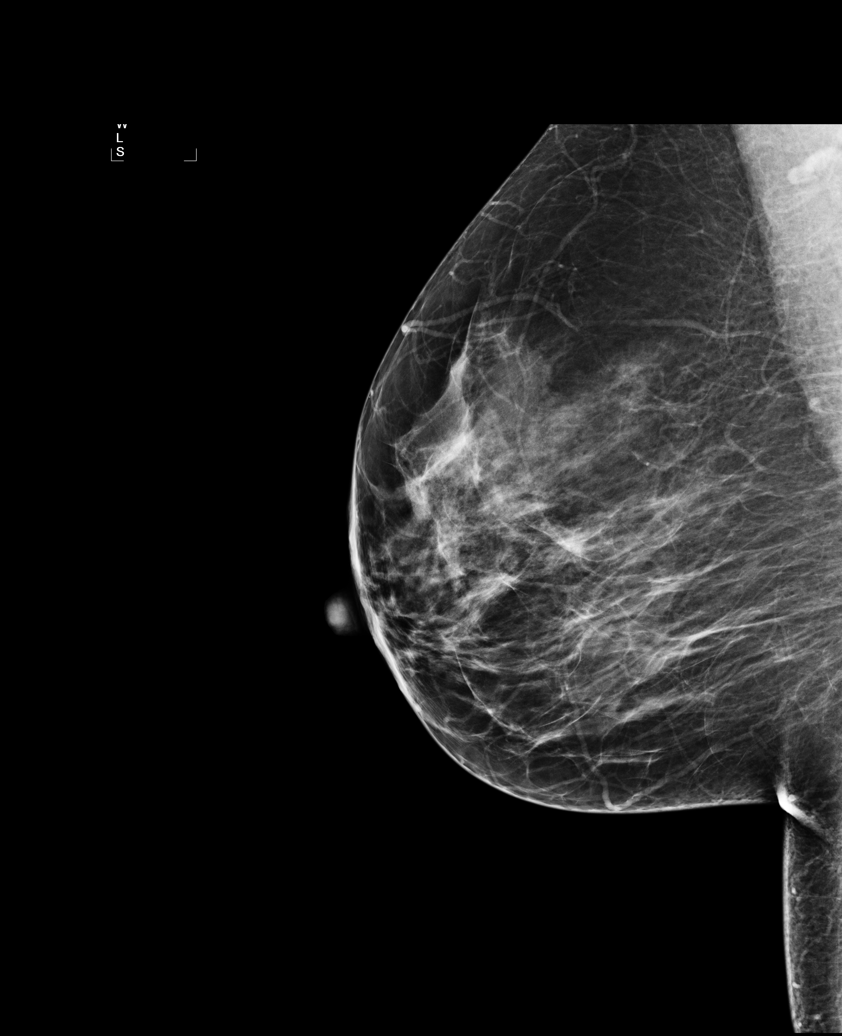

[L CC]
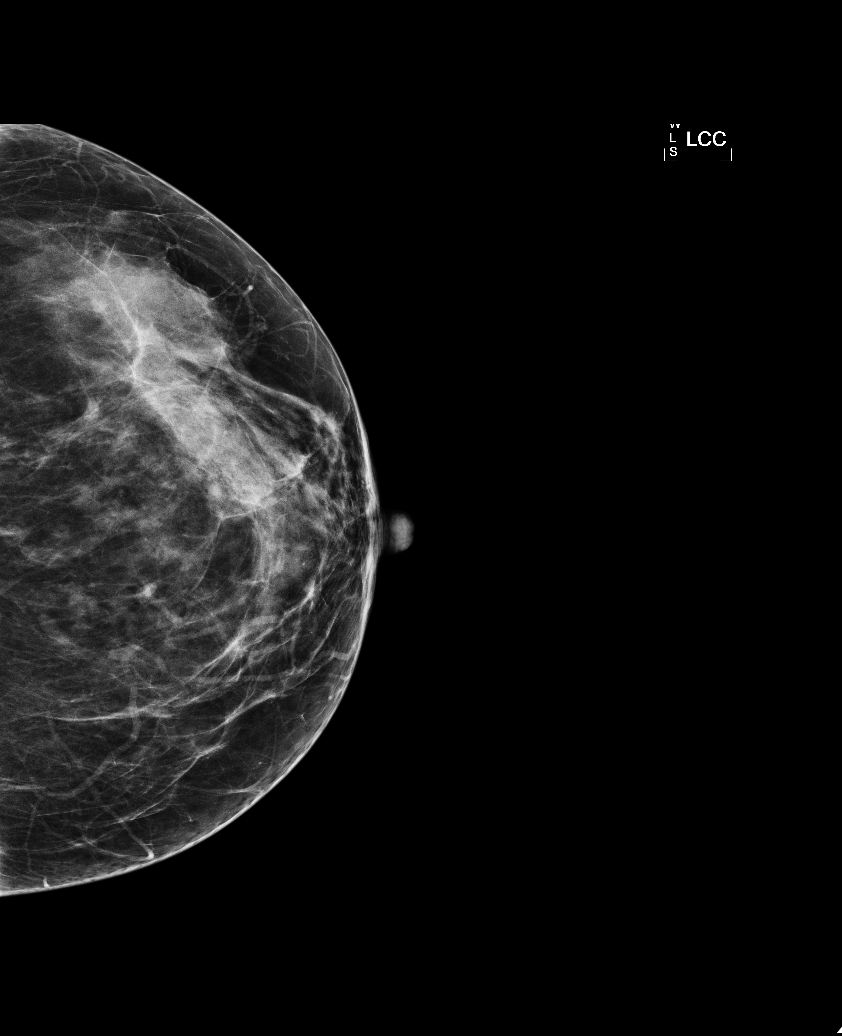

[L MLO (1 of 2)]
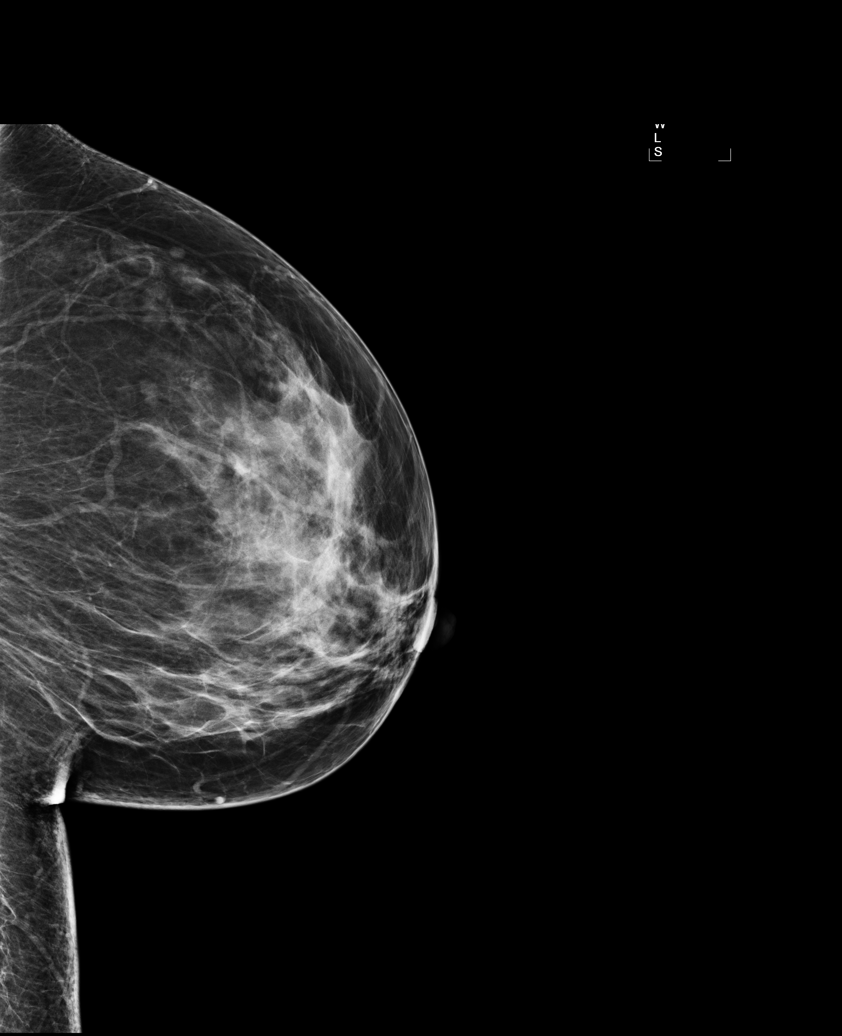

[L MLO (2 of 2)]
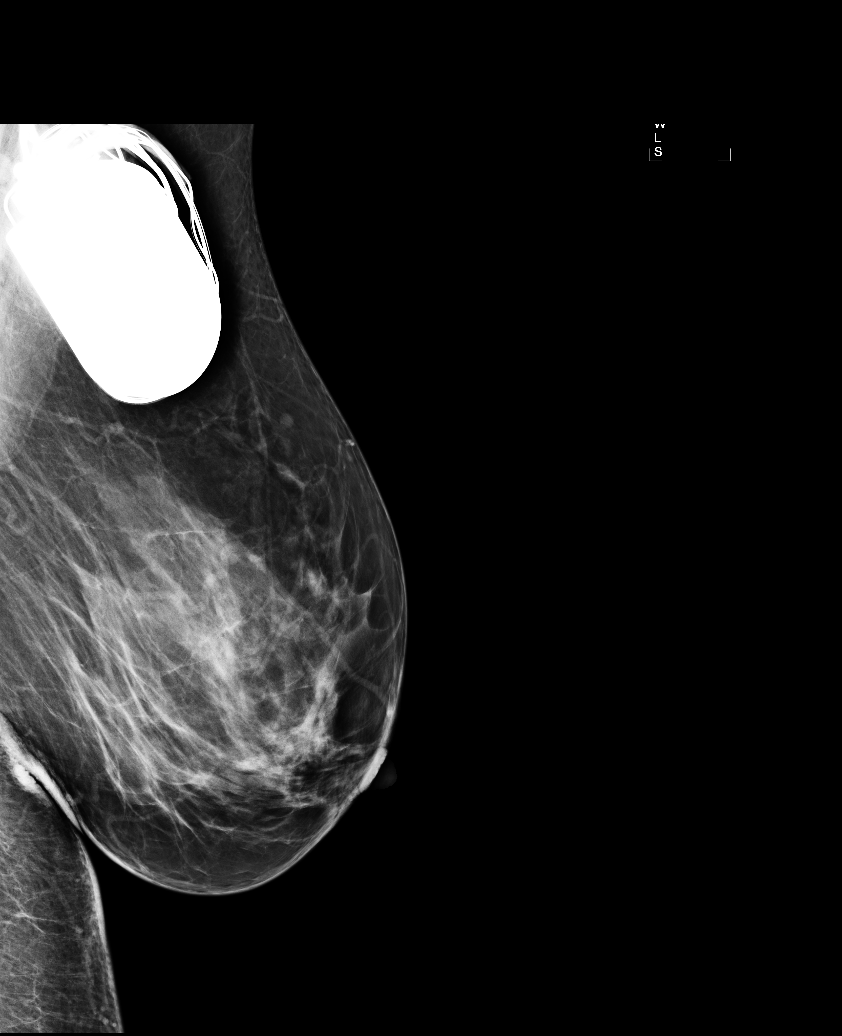

[5 of 5 positions shown; findings below may reference images not displayed]

FINDINGS: ACR Breast Density Category 3: The breast tissue is heterogeneously
dense.

In the right breast, a possible mass warrants further evaluation
with spot compression views and possibly ultrasound.  In the left
breast, no masses or malignant type calcifications are identified.

Images were processed with CAD.
IMPRESSION: Further evaluation is suggested for possible mass in the right
breast.

RECOMMENDATION:
Diagnostic mammogram and possibly ultrasound of the right breast.
(Code:[id number])

BI-RADS CATEGORY 0:  Incomplete.  Need additional imaging
evaluation and/or prior mammograms for comparison.

## 2014-11-01 ENCOUNTER — Other Ambulatory Visit: Payer: Self-pay | Admitting: Obstetrics & Gynecology

## 2014-11-01 DIAGNOSIS — N63 Unspecified lump in unspecified breast: Secondary | ICD-10-CM

## 2014-11-08 ENCOUNTER — Telehealth: Payer: Self-pay | Admitting: Cardiovascular Disease

## 2014-11-08 ENCOUNTER — Encounter: Payer: Self-pay | Admitting: *Deleted

## 2014-11-08 NOTE — Telephone Encounter (Signed)
Spoke with pt. She reports Dr. [name] has written letter in past indicating she is unable to work due to her cardiac condition.  Pt is asking for updated letter. Pt reports she does not have any new complaints at this time.  Feels the same as when she was last seen in office.

## 2014-11-08 NOTE — Telephone Encounter (Signed)
That is ok with me. cdm 

## 2014-11-08 NOTE — Telephone Encounter (Signed)
New message  Pt called states that she would rather disclose the concerns with a nurse. Please call back to discuss

## 2014-11-08 NOTE — Telephone Encounter (Signed)
Letter written.  I spoke with pt and she will pick up tomorrow. Letter left at front desk.

## 2014-11-09 ENCOUNTER — Ambulatory Visit
Admission: RE | Admit: 2014-11-09 | Discharge: 2014-11-09 | Disposition: A | Discharge status: Home / Self Care | Payer: Medicaid Other | Source: Ambulatory Visit | Attending: Obstetrics & Gynecology | Admitting: Obstetrics & Gynecology

## 2014-11-09 DIAGNOSIS — N63 Unspecified lump in unspecified breast: Secondary | ICD-10-CM

## 2014-11-22 ENCOUNTER — Encounter: Payer: Medicaid Other | Admitting: Internal Medicine

## 2015-01-03 ENCOUNTER — Encounter: Payer: Medicaid Other | Admitting: Internal Medicine

## 2015-01-21 ENCOUNTER — Encounter: Payer: Self-pay | Admitting: Internal Medicine

## 2015-01-25 ENCOUNTER — Telehealth: Payer: Self-pay | Admitting: Cardiovascular Disease

## 2015-01-25 NOTE — Telephone Encounter (Signed)
Patient wants to have an ultrasound because she has not had one since 03/2013. Informed patient that there is no order at this time. Patient states, "please tell Dr. [name] that I will feel better if I have one done soon." Will forward to Dr. [name] for further instructions. Patient verbalized understanding.

## 2015-01-25 NOTE — Telephone Encounter (Signed)
New Message  Pt calling about possible ultrasound, no order/recall for any ultrasound in appt desk. Please call back and discuss.

## 2015-01-28 NOTE — Telephone Encounter (Signed)
She had an echo in May 2014. I will discuss repeating her echo at her follow up visit with me. She needs to have a follow up appt arranged with me in the next 1-2 months. Thanks, chris

## 2015-01-29 NOTE — Telephone Encounter (Signed)
Mailbox full.  Will continue to try to reach pt

## 2015-01-29 NOTE — Telephone Encounter (Signed)
Spoke with pt and gave her information from Dr. [name].  She is unable to schedule appt today but will call us back to schedule appt for next 1-2 months.

## 2015-02-14 ENCOUNTER — Encounter (HOSPITAL_COMMUNITY): Payer: Self-pay | Admitting: Emergency Medicine

## 2015-02-14 ENCOUNTER — Emergency Department (HOSPITAL_COMMUNITY)
Admission: EM | Admit: 2015-02-14 | Discharge: 2015-02-15 | Disposition: A | Discharge status: Home / Self Care | Payer: Medicaid Other | Attending: Emergency Medicine | Admitting: Emergency Medicine

## 2015-02-14 DIAGNOSIS — I10 Essential (primary) hypertension: Secondary | ICD-10-CM | POA: Diagnosis not present

## 2015-02-14 DIAGNOSIS — I5022 Chronic systolic (congestive) heart failure: Secondary | ICD-10-CM | POA: Diagnosis not present

## 2015-02-14 DIAGNOSIS — K219 Gastro-esophageal reflux disease without esophagitis: Secondary | ICD-10-CM | POA: Diagnosis not present

## 2015-02-14 DIAGNOSIS — R000 Tachycardia, unspecified: Secondary | ICD-10-CM | POA: Insufficient documentation

## 2015-02-14 DIAGNOSIS — Z792 Long term (current) use of antibiotics: Secondary | ICD-10-CM | POA: Insufficient documentation

## 2015-02-14 DIAGNOSIS — R42 Dizziness and giddiness: Secondary | ICD-10-CM | POA: Diagnosis not present

## 2015-02-14 DIAGNOSIS — Z95810 Presence of automatic (implantable) cardiac defibrillator: Secondary | ICD-10-CM | POA: Insufficient documentation

## 2015-02-14 DIAGNOSIS — Z79899 Other long term (current) drug therapy: Secondary | ICD-10-CM | POA: Insufficient documentation

## 2015-02-14 DIAGNOSIS — D649 Anemia, unspecified: Secondary | ICD-10-CM | POA: Diagnosis not present

## 2015-02-14 DIAGNOSIS — Z8674 Personal history of sudden cardiac arrest: Secondary | ICD-10-CM | POA: Insufficient documentation

## 2015-02-14 DIAGNOSIS — Z7982 Long term (current) use of aspirin: Secondary | ICD-10-CM | POA: Diagnosis not present

## 2015-02-14 DIAGNOSIS — Z791 Long term (current) use of non-steroidal anti-inflammatories (NSAID): Secondary | ICD-10-CM | POA: Insufficient documentation

## 2015-02-14 DIAGNOSIS — Z87891 Personal history of nicotine dependence: Secondary | ICD-10-CM | POA: Diagnosis not present

## 2015-02-14 LAB — CBC WITH DIFFERENTIAL/PLATELET
BASOS PCT: 0 % (ref 0–1)
Basophils Absolute: 0 10*3/uL (ref 0.0–0.1)
EOS ABS: 0.2 10*3/uL (ref 0.0–0.7)
EOS PCT: 2 % (ref 0–5)
HCT: 39.7 % (ref 36.0–46.0)
HEMOGLOBIN: 12.9 g/dL (ref 12.0–15.0)
LYMPHS ABS: 2.1 10*3/uL (ref 0.7–4.0)
Lymphocytes Relative: 24 % (ref 12–46)
MCH: 26.3 pg (ref 26.0–34.0)
MCHC: 32.5 g/dL (ref 30.0–36.0)
MCV: 80.9 fL (ref 78.0–100.0)
MONO ABS: 0.6 10*3/uL (ref 0.1–1.0)
MONOS PCT: 7 % (ref 3–12)
NEUTROS ABS: 5.9 10*3/uL (ref 1.7–7.7)
NEUTROS PCT: 67 % (ref 43–77)
Platelets: 290 10*3/uL (ref 150–400)
RBC: 4.91 MIL/uL (ref 3.87–5.11)
RDW: 15.6 % — ABNORMAL HIGH (ref 11.5–15.5)
WBC: 8.7 10*3/uL (ref 4.0–10.5)

## 2015-02-14 LAB — URINE MICROSCOPIC-ADD ON

## 2015-02-14 LAB — URINALYSIS, ROUTINE W REFLEX MICROSCOPIC
Glucose, UA: NEGATIVE mg/dL
HGB URINE DIPSTICK: NEGATIVE
KETONES UR: NEGATIVE mg/dL
NITRITE: NEGATIVE
PH: 5.5 (ref 5.0–8.0)
Protein, ur: NEGATIVE mg/dL
Specific Gravity, Urine: 1.033 — ABNORMAL HIGH (ref 1.005–1.030)
Urobilinogen, UA: 0.2 mg/dL (ref 0.0–1.0)

## 2015-02-14 LAB — BASIC METABOLIC PANEL
Anion gap: 8 (ref 5–15)
BUN: 27 mg/dL — ABNORMAL HIGH (ref 6–23)
CALCIUM: 8.9 mg/dL (ref 8.4–10.5)
CO2: 25 mmol/L (ref 19–32)
CREATININE: 1.31 mg/dL — AB (ref 0.50–1.10)
Chloride: 105 mmol/L (ref 96–112)
GFR calc Af Amer: 53 mL/min — ABNORMAL LOW (ref >90)
GFR, EST NON AFRICAN AMERICAN: 46 mL/min — AB (ref >90)
Glucose, Bld: 102 mg/dL — ABNORMAL HIGH (ref 70–99)
Potassium: 4.1 mmol/L (ref 3.5–5.1)
SODIUM: 138 mmol/L (ref 135–145)

## 2015-02-14 LAB — I-STAT TROPONIN, ED: Troponin i, poc: 0 ng/mL (ref 0.00–0.08)

## 2015-02-14 MED ORDER — SODIUM CHLORIDE 0.9 % IV BOLUS (SEPSIS)
1000.0000 mL | Freq: Once | INTRAVENOUS | Status: AC
  Administered 2015-02-14: 1000 mL via INTRAVENOUS

## 2015-02-14 MED ORDER — SODIUM CHLORIDE 0.9 % IV BOLUS (SEPSIS)
500.0000 mL | Freq: Once | INTRAVENOUS | Status: AC
  Administered 2015-02-14: 500 mL via INTRAVENOUS

## 2015-02-14 NOTE — ED Provider Notes (Signed)
CSN: [id number]     Arrival date & time 02/14/15  1949 History   First MD Initiated Contact with Patient 02/14/15 2213     Chief Complaint  Patient presents with  . Dizziness     (Consider location/radiation/quality/duration/timing/severity/associated sxs/prior Treatment) Patient is a 54 y.o. female presenting with dizziness. The history is provided by the patient. No language interpreter was used.  Dizziness Quality:  Room spinning Severity:  Mild Onset quality:  Gradual Duration:  4 days Associated symptoms: no headaches   Associated symptoms comment:  Dizziness for the past 4 days that is related to position. No headache, CP, SOB, nausea or vomiting. She denies history of vertigo. No syncope or near syncope. She reports a history of HTN requiring multiple medications including Lisinopril which she stopped taking some months ago without physician direction. She reports that she started taking it again after symptoms of dizziness started and has had 2 doses in the last 4 days. She also reports taking Ultram recently (100 mg/dose) because she hasn't felt well.    Past Medical History  Diagnosis Date  . Other left bundle branch block   . Automatic implantable cardiac defibrillator in situ     a. 06/2010 s/p  s/p SJM 3231-40 Bi V ICD Ser # [id number].  Marland Kitchen Chronic systolic heart failure   . Anemia, unspecified   . Paroxysmal supraventricular tachycardia   . Nonischemic cardiomyopathy     a. 06/2010 OOH VF arrest;  b. 06/2010 Cath: nonobs dzs, EF 15-20%, glob HK;  c. 06/2010 Echo: EF 15%, diff HK, Gr 2 DD;  d. 06/2010 Cardiac MRI EF of 29%, no scar;  e. 06/2010 s/p SJM 3231-40 Bi V ICD Ser # [id number].  Marland Kitchen Atrial fibrillation     a. This occurred in setting of resuscitation efforts during VF arrest.  . Cardiac arrest     a. 06/2010  . History of tobacco abuse   . History of cocaine abuse     remote  . Thyroid disease   . SUBSTANCE ABUSE, MULTIPLE 08/13/2010    Qualifier: Diagnosis of  By: Denyse Amass  CMA, Carol    . Hypertension   . Depression     ok  . CHF (congestive heart failure)   . Shortness of breath     with exertion  . Pacemaker   . Automatic implantable cardioverter-defibrillator in situ   . GERD (gastroesophageal reflux disease)    Past Surgical History  Procedure Laterality Date  . Cardiac defibrillator placement      [facility] [place] CD [id number] Q defib, serial number [id number]  . Cardiac defibrillator placement    . Open reduction internal fixation (orif) distal radial fracture Left 05/07/2014    Procedure: OPEN REDUCTION INTERNAL FIXATION (ORIF) DISTAL RADIAL FRACTURE;  Surgeon: Jodi Marble, MD;  Location: [facility] OR;  Service: Orthopedics;  Laterality: Left;   Family History  Problem Relation Age of Onset  . Heart disease Father   . Hypertension Father   . Hypertension Mother   . Heart disease Mother    History  Substance Use Topics  . Smoking status: Former Smoker    Quit date: 07/28/2011  . Smokeless tobacco: Never Used  . Alcohol Use: No   OB History    Gravida Para Term Preterm AB TAB SAB Ectopic Multiple Living   Review of Systems  Constitutional: Negative for fever and chills.  HENT: Negative.  Respiratory: Negative.   Cardiovascular: Negative.   Gastrointestinal: Negative.   Musculoskeletal: Negative.   Skin: Negative.   Neurological: Positive for dizziness. Negative for syncope and headaches.      Allergies  Review of patient's allergies indicates no known allergies.  Home Medications   Prior to Admission medications   Medication Sig Start Date End Date Taking? Authorizing Provider  albuterol (PROVENTIL HFA;VENTOLIN HFA) 108 (90 BASE) MCG/ACT inhaler Inhale 2 puffs into the lungs every 4 (four) hours as needed. For shortness of breath or wheezing   Yes Historical Provider, MD  aspirin 81 MG chewable tablet Chew 81 mg by mouth daily.   Yes Historical Provider, MD  carvedilol (COREG) 25 MG tablet Take 0.5 tablets (12.5 mg  total) by mouth 2 (two) times daily with a meal. 09/25/14  Yes Kathleene Hazel, MD  diphenhydrAMINE (BENADRYL) 25 MG tablet Take 1 tablet (25 mg total) by mouth every 6 (six) hours as needed for itching (Rash). 05/27/13  Yes Antony Madura, PA-C  furosemide (LASIX) 40 MG tablet Take 1 tablet (40 mg total) by mouth daily. 09/19/14  Yes Kathleene Hazel, MD  ibuprofen (ADVIL,MOTRIN) 200 MG tablet Take 200 mg by mouth every 6 (six) hours as needed for mild pain.   Yes Historical Provider, MD  isosorbide-hydrALAZINE (BIDIL) 20-37.5 MG per tablet Take 1 tablet by mouth 2 (two) times daily. 11/10/12  Yes Duke Salvia, MD  lisinopril (PRINIVIL,ZESTRIL) 5 MG tablet Take 1 tablet (5 mg total) by mouth daily. 09/19/14  Yes Kathleene Hazel, MD  methocarbamol (ROBAXIN) 500 MG tablet Take 1 tablet (500 mg total) by mouth 2 (two) times daily. 06/02/14  Yes Mercedes Camprubi-Soms, PA-C  Multiple Vitamin (MULTIVITAMIN WITH MINERALS) TABS Take 1 tablet by mouth daily.   Yes Historical Provider, MD  polyethylene glycol (MIRALAX / GLYCOLAX) packet Take 17 g by mouth daily as needed (constipation).  10/28/12  Yes Rolan Bucco, MD  potassium chloride SA (K-DUR,KLOR-CON) 20 MEQ tablet Take 1 tablet (20 mEq total) by mouth daily. 09/19/14  Yes Kathleene Hazel, MD  ranitidine (ZANTAC) 150 MG tablet Take 150 mg by mouth daily as needed for heartburn.   Yes Historical Provider, MD  traMADol (ULTRAM) 50 MG tablet Take 1 tablet (50 mg total) by mouth every 6 (six) hours as needed. FOR PAIN 01/07/14  Yes Linwood Dibbles, MD  antipyrine-benzocaine Lyla Son) otic solution Place 3-4 drops into the right ear every 2 (two) hours as needed for ear pain. Patient not taking: Reported on 02/14/2015 08/08/14   Ozella Rocks, MD  ciprofloxacin-dexamethasone [facility]) otic suspension Place 4 drops into the right ear 2 (two) times daily. Treat for 5-7 days Patient not taking: Reported on 02/14/2015 08/08/14   Ozella Rocks, MD   naproxen (NAPROSYN) 500 MG tablet Take 1 tablet (500 mg total) by mouth 2 (two) times daily. Patient not taking: Reported on 02/14/2015 06/02/14   Mercedes Camprubi-Soms, PA-C  ondansetron (ZOFRAN-ODT) 4 MG disintegrating tablet Take 1 tablet (4 mg total) by mouth every 8 (eight) hours as needed for nausea or vomiting. Patient not taking: Reported on 02/14/2015 08/08/14   Ozella Rocks, MD  oxyCODONE-acetaminophen (PERCOCET) 5-325 MG per tablet Take 1-2 tablets by mouth every 6 (six) hours as needed for severe pain. Patient not taking: Reported on 02/14/2015 06/02/14   Mercedes Camprubi-Soms, PA-C  oxyCODONE-acetaminophen (ROXICET) 5-325 MG per tablet Take 1-2 tablets by mouth every 4 (four) hours as needed. Patient not taking: Reported on 02/14/2015  05/07/14   Mack Hook, MD   BP 123/75 mmHg  Pulse 120  Temp(Src) 97.8 F (36.6 C) (Oral)  Resp 18  Ht 5\' 3"  (1.6 m)  Wt 150 lb (68.04 kg)  BMI 26.58 kg/m2  SpO2 92% Physical Exam  Constitutional: She is oriented to person, place, and time. She appears well-developed and well-nourished. No distress.  HENT:  Head: Normocephalic.  Right Ear: External ear normal.  Left Ear: External ear normal.  Eyes: Conjunctivae are normal.  Neck: Normal range of motion. Neck supple.  Cardiovascular: Regular rhythm.  Tachycardia present.   Pulmonary/Chest: Effort normal. She has no wheezes. She has no rales.  Abdominal: Soft. There is no tenderness.  Musculoskeletal: Normal range of motion. She exhibits no edema.  Neurological: She is alert and oriented to person, place, and time. Coordination normal.  Symptoms of dizziness reproduced by sitting patient up in bed.  Skin: Skin is warm and dry.  Psychiatric: She has a normal mood and affect.    ED Course  Procedures (including critical care time) Labs Review Labs Reviewed  CBC WITH DIFFERENTIAL/PLATELET - Abnormal; Notable for the following:    RDW 15.6 (*)    All other components within normal limits   BASIC METABOLIC PANEL - Abnormal; Notable for the following:    Glucose, Bld 102 (*)    BUN 27 (*)    Creatinine, Ser 1.31 (*)    GFR calc non Af Amer 46 (*)    GFR calc Af Amer 53 (*)    All other components within normal limits  URINALYSIS, ROUTINE W REFLEX MICROSCOPIC  I-STAT TROPOININ, ED    Imaging Review No results found.   EKG Interpretation   Date/Time:  Thursday February 14 2015 19:58:07 EDT Ventricular Rate:  86 PR Interval:  138 QRS Duration: 118 QT Interval:  420 QTC Calculation: 502 R Axis:   -120 Text Interpretation:  Atrial-sensed ventricular-paced rhythm When compared  with ECG of 01/07/2014 No significant change was found Confirmed by [facility]   MD, Nicholos Johns [phone]) on 02/14/2015 10:35:46 PM      MDM   Final diagnoses:  None    1. Dizziness 2. Dehydration  Positive orthostasis - IV fluids ordered. DDx: vertigo vs cardiogenic vs dehydration (viral illness).   12:30 - re-eval:  She is feeling some better with IV fluids. Doubt Vertigo. EKG, labs studies, ICD interrogation all negative. Doubt cardiogenic. HR normal with fluids.   Will continue to hydrate with ordered liter fluids and re-evaluate.    1:20:  Patient ambulatory without dizziness. Feels significantly better and ready for discharge home.   Elpidio Anis, PA-C 02/15/15 0120  Samuel Jester, DO 02/15/15 1309

## 2015-02-14 NOTE — ED Notes (Signed)
Onset 3 days ago intermittent lightheaded and dizziness with left side neck pain. Alert answering and following commands appropriate. Started taking lisinopril this week. States in the past felt same symptoms and was off medication for a while.

## 2015-02-15 ENCOUNTER — Telehealth: Payer: Self-pay | Admitting: Cardiovascular Disease

## 2015-02-15 NOTE — Discharge Instructions (Signed)

## 2015-02-15 NOTE — ED Notes (Signed)
Melvenia Beam, PA-C, at the bedside. Discussed patient has ambulated twice unassisted.

## 2015-02-15 NOTE — Telephone Encounter (Signed)
Spoke with pt. Was seen in ED for dizziness yesterday.  Is at primary care office at this time.  Appt made for pt to see Dr. [name] on March 01, 2015 at 10:45.

## 2015-02-15 NOTE — Telephone Encounter (Signed)
New message  Pt called was seen in the hospital for Dizziness. Requests a call back to see if she can get a sooner appt with Dr. [name]. Offered PA. and Next available appt. Pt declined. Request a call back from the nurse.

## 2015-02-28 NOTE — Progress Notes (Signed)
Chief Complaint  Patient presents with  . Dizziness  . Shortness of Breath   History of Present Illness: 54 yo AAF with h/o NICM, prior VF arrest August 2011, PAF here today for cardiac follow up. She was admitted to [facility] August 2011 after an out of hospital V. Fib arrest. She was cooled on the Arctic sun protocol and underwent a cardiac cath which showed no evidence of obstructive CAD. Echo showed EF of 15%. Cardiac MRI showed EF of 29%. 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 She had an ICD implanted by Dr. Graciela Husbands on July 18, 2010. She has had chronic chest pain since her cardiac arrest. Echo May 2014 with LVEF=50%. She has been seen in the CHF clinic and has been released. Positive drug screen last year for cocaine.   She is here for folllow up. She was seen in the ED 02/14/15 for dizziness. This has resolved. She thinks this was related to her sinuses. No exertional chest pain. Weight is stable. No near syncope, syncope, orthopnea or PND. She has been taking all of her medications. She uses an extra Lasix if needed for LE swelling.   Primary Care Physician: [facility]  Past Medical History  Diagnosis Date  . Other left bundle branch block   . Automatic implantable cardiac defibrillator in situ     a. 06/2010 s/p  s/p SJM 3231-40 Bi V ICD Ser # [id number].  Marland Kitchen Chronic systolic heart failure   . Anemia, unspecified   . Paroxysmal supraventricular tachycardia   . Nonischemic cardiomyopathy     a. 06/2010 OOH VF arrest;  b. 06/2010 Cath: nonobs dzs, EF 15-20%, glob HK;  c. 06/2010 Echo: EF 15%, diff HK, Gr 2 DD;  d. 06/2010 Cardiac MRI EF of 29%, no scar;  e. 06/2010 s/p SJM 3231-40 Bi V ICD Ser # [id number].  Marland Kitchen Atrial fibrillation     a. This occurred in setting of resuscitation efforts during VF arrest.  . Cardiac arrest     a. 06/2010  . History of tobacco abuse   . History of cocaine abuse     remote  . Thyroid  disease   . SUBSTANCE ABUSE, MULTIPLE 08/13/2010    Qualifier: Diagnosis of  By: Denyse Amass CMA, Carol    . Hypertension   . Depression     ok  . CHF (congestive heart failure)   . Shortness of breath     with exertion  . Pacemaker   . Automatic implantable cardioverter-defibrillator in situ   . GERD (gastroesophageal reflux disease)     Past Surgical History  Procedure Laterality Date  . Cardiac defibrillator placement      [facility] CD [id number] Q defib, serial number [id number]  . Cardiac defibrillator placement    . Open reduction internal fixation (orif) distal radial fracture Left 05/07/2014    Procedure: OPEN REDUCTION INTERNAL FIXATION (ORIF) DISTAL RADIAL FRACTURE;  Surgeon: Jodi Marble, MD;  Location: [facility] OR;  Service: Orthopedics;  Laterality: Left;    Current Outpatient Prescriptions  Medication Sig Dispense Refill  . albuterol (PROVENTIL HFA;VENTOLIN HFA) 108 (90 BASE) MCG/ACT inhaler Inhale 2 puffs into the lungs every 4 (four) hours as needed. For shortness of breath or wheezing    . antipyrine-benzocaine (AURALGAN) otic solution Place 3-4 drops into the right ear every 2 (two) hours as needed for ear pain. 10 mL 0  . aspirin 81 MG chewable  tablet Chew 81 mg by mouth daily.    . carvedilol (COREG) 25 MG tablet Take 0.5 tablets (12.5 mg total) by mouth 2 (two) times daily with a meal. 30 tablet 6  . diphenhydrAMINE (BENADRYL) 25 MG tablet Take 1 tablet (25 mg total) by mouth every 6 (six) hours as needed for itching (Rash). 30 tablet 0  . furosemide (LASIX) 40 MG tablet Take 1 tablet (40 mg total) by mouth daily. 30 tablet 6  . ibuprofen (ADVIL,MOTRIN) 200 MG tablet Take 200 mg by mouth every 6 (six) hours as needed for mild pain.    . isosorbide-hydrALAZINE (BIDIL) 20-37.5 MG per tablet Take 1 tablet by mouth 2 (two) times daily. 180 tablet 3  . lisinopril (PRINIVIL,ZESTRIL) 5 MG tablet Take 1 tablet (5 mg total) by mouth daily. 30 tablet 6  . methocarbamol (ROBAXIN) 500 MG  tablet Take 1 tablet (500 mg total) by mouth 2 (two) times daily. 20 tablet 0  . Multiple Vitamin (MULTIVITAMIN WITH MINERALS) TABS Take 1 tablet by mouth daily.    . naproxen (NAPROSYN) 500 MG tablet Take 1 tablet (500 mg total) by mouth 2 (two) times daily. 30 tablet 0  . ondansetron (ZOFRAN-ODT) 4 MG disintegrating tablet Take 1 tablet (4 mg total) by mouth every 8 (eight) hours as needed for nausea or vomiting. 30 tablet 0  . oxyCODONE-acetaminophen (PERCOCET) 5-325 MG per tablet Take 1-2 tablets by mouth every 6 (six) hours as needed for severe pain. 6 tablet 0  . oxyCODONE-acetaminophen (ROXICET) 5-325 MG per tablet Take 1-2 tablets by mouth every 4 (four) hours as needed. 80 tablet 0  . polyethylene glycol (MIRALAX / GLYCOLAX) packet Take 17 g by mouth daily as needed (constipation).     . potassium chloride SA (K-DUR,KLOR-CON) 20 MEQ tablet Take 1 tablet (20 mEq total) by mouth daily. 30 tablet 6  . ranitidine (ZANTAC) 150 MG tablet Take 150 mg by mouth daily as needed for heartburn.    . traMADol (ULTRAM) 50 MG tablet Take 1 tablet (50 mg total) by mouth every 6 (six) hours as needed. FOR PAIN 30 tablet 0   No current facility-administered medications for this visit.    No Known Allergies  History   Social History  . Marital Status: Married    Spouse Name: N/A  . Number of Children: 4  . Years of Education: N/A   Occupational History  . Disablity    Social History Main Topics  . Smoking status: Former Smoker    Quit date: 07/28/2011  . Smokeless tobacco: Never Used  . Alcohol Use: No  . Drug Use: No     Comment: history of cocaine abuse last used 2008  . Sexual Activity: Yes    Birth Control/ Protection: IUD   Other Topics Concern  . Not on file   Social History Narrative    Family History  Problem Relation Age of Onset  . Heart disease Father   . Hypertension Father   . Hypertension Mother   . Heart disease Mother     Review of Systems:  As stated in the  HPI and otherwise negative.   BP 126/76 mmHg  Pulse 94  Ht  (1.6 m)  Wt 147 lb (66.679 kg)  BMI 26.05 kg/m2  SpO2 98%  Physical Examination: General: Well developed, well nourished, NAD HEENT: OP clear, mucus membranes moist SKIN: warm, dry. No rashes. Neuro: No focal deficits Musculoskeletal: Muscle strength 5/5 all ext Psychiatric: Mood and affect  normal Neck: No JVD, no carotid bruits, no thyromegaly, no lymphadenopathy. Lungs:Clear bilaterally, no wheezes, rhonci, crackles Cardiovascular: Regular rate and rhythm. No murmurs, gallops or rubs. Abdomen:Soft. Bowel sounds present. Non-tender.  Extremities: No lower extremity edema. Pulses are 2 + in the bilateral DP/PT.  Echo May 2014: Left ventricle: The cavity size was normal. Wall thickness was normal. Systolic function was mildly to moderately reduced. The estimated ejection fraction was in the range of 40% to 45% (50% by overread of Dr. Gala Romney). Features are consistent with a pseudonormal left ventricular filling pattern, with concomitant abnormal relaxation and increased filling pressure (grade 2 diastolic dysfunction). - Pulmonary arteries: PA peak pressure: 67mm Hg (S).  EKG:  EKG is not ordered today.  Recent Labs: 05/07/2014: ALT 12 02/14/2015: BUN 27*; Creatinine 1.31*; Hemoglobin 12.9; Platelets 290; Potassium 4.1; Sodium 138   Lipid Panel    Component Value Date/Time   CHOL 179 11/10/2012 1402   TRIG 97.0 11/10/2012 1402   HDL 34.80* 11/10/2012 1402   CHOLHDL 5 11/10/2012 1402   VLDL 19.4 11/10/2012 1402   LDLCALC 125* 11/10/2012 1402     Wt Readings from Last 3 Encounters:  03/01/15 147 lb (66.679 kg)  06/02/14 142 lb 9 oz (64.666 kg)  05/30/14 147 lb 12.8 oz (67.042 kg)     Other studies Reviewed: Additional studies/ records that were reviewed today include: ED records   Assessment and Plan:   1. Non ischemic cardiomyopathy: Echo May 2014 with LVEF=50%. She is on good medical therapy.  Blood pressure is well controlled. No changes in therapy today.   2. SYSTOLIC HEART FAILURE, CHRONIC: Volume status is normal today. Weight has been stable.Continue to follow daily weights at home.  Will continue Lasix daily and potassium supplementation. Dr. Graciela Husbands follows her ICD.   3. PAF: No recurrence.   4. History of cardiac arrest: ICD in place.   5. Substance abuse: UDS positive 2015 for cocaine. I counseled her on this today.   Current medicines are reviewed at length with the patient today.  The patient does not have concerns regarding medicines.  The following changes have been made:  no change  Labs/ tests ordered today include: echo  Orders Placed This Encounter  Procedures  . 2D Echocardiogram without contrast    Disposition:   FU with me in 6 months  Signed, Verne Carrow, MD 03/01/2015 12:43 PM    [facility] [address] [place], [place], [place]  [address] Phone: [phone]; Fax: [phone]

## 2015-03-01 ENCOUNTER — Ambulatory Visit (INDEPENDENT_AMBULATORY_CARE_PROVIDER_SITE_OTHER): Payer: Medicaid Other | Admitting: Cardiovascular Disease

## 2015-03-01 ENCOUNTER — Encounter: Payer: Self-pay | Admitting: Cardiovascular Disease

## 2015-03-01 VITALS — BP 126/76 | HR 94 | Ht 63.0 in | Wt 147.0 lb

## 2015-03-01 DIAGNOSIS — I429 Cardiomyopathy, unspecified: Secondary | ICD-10-CM | POA: Diagnosis not present

## 2015-03-01 DIAGNOSIS — I5022 Chronic systolic (congestive) heart failure: Secondary | ICD-10-CM | POA: Diagnosis not present

## 2015-03-01 DIAGNOSIS — I428 Other cardiomyopathies: Secondary | ICD-10-CM

## 2015-03-01 NOTE — Patient Instructions (Signed)

## 2015-03-05 ENCOUNTER — Other Ambulatory Visit (HOSPITAL_COMMUNITY): Payer: Medicaid Other

## 2015-03-13 ENCOUNTER — Ambulatory Visit: Payer: Medicaid Other | Admitting: Obstetrics & Gynecology

## 2015-03-13 ENCOUNTER — Other Ambulatory Visit (HOSPITAL_COMMUNITY): Payer: Medicaid Other

## 2015-03-15 ENCOUNTER — Other Ambulatory Visit (HOSPITAL_COMMUNITY): Payer: Medicaid Other

## 2015-03-18 ENCOUNTER — Ambulatory Visit (HOSPITAL_COMMUNITY): Payer: Medicaid Other | Attending: Cardiovascular Disease

## 2015-03-20 ENCOUNTER — Encounter (HOSPITAL_COMMUNITY): Payer: Self-pay | Admitting: Cardiovascular Disease

## 2015-04-05 ENCOUNTER — Ambulatory Visit: Payer: Medicaid Other | Admitting: Obstetrics and Gynecology

## 2015-04-23 ENCOUNTER — Ambulatory Visit (HOSPITAL_COMMUNITY): Payer: Medicaid Other | Attending: Cardiovascular Disease

## 2015-04-23 ENCOUNTER — Other Ambulatory Visit: Payer: Self-pay

## 2015-04-23 DIAGNOSIS — I429 Cardiomyopathy, unspecified: Secondary | ICD-10-CM | POA: Diagnosis not present

## 2015-04-23 DIAGNOSIS — I340 Nonrheumatic mitral (valve) insufficiency: Secondary | ICD-10-CM | POA: Insufficient documentation

## 2015-04-23 DIAGNOSIS — I428 Other cardiomyopathies: Secondary | ICD-10-CM

## 2015-05-06 ENCOUNTER — Ambulatory Visit (INDEPENDENT_AMBULATORY_CARE_PROVIDER_SITE_OTHER): Payer: Medicaid Other | Admitting: Obstetrics and Gynecology

## 2015-05-06 ENCOUNTER — Encounter: Payer: Self-pay | Admitting: Obstetrics and Gynecology

## 2015-05-06 VITALS — BP 142/90 | HR 83 | Temp 98.5°F | Ht 63.0 in | Wt 145.2 lb

## 2015-05-06 DIAGNOSIS — Z30431 Encounter for routine checking of intrauterine contraceptive device: Secondary | ICD-10-CM | POA: Diagnosis not present

## 2015-05-06 NOTE — Progress Notes (Signed)
Patient ID: Angela Payne, female   DOB: [birth year], 54 y.o.   MRN: [id number] 54 yo G4P4 with Mirena induced amenorrhea presenting today for IUD check. Patient has had the IUD in place since 2012 and has it checked several times a year just to make sure it is still there. She states that [facility] performed a pelvic exam and did not visualized the strings. She does not think that it has fallen out. She denies any abnormal bleeding or pelvic pain. She denies hot flushes, night sweats, mood swings. Patient is without any complaints.   Past Medical History  Diagnosis Date  . Other left bundle branch block   . Automatic implantable cardiac defibrillator in situ     a. 07-26-2010 s/p  s/p SJM 3231-40 Bi V ICD Ser # [id number].  Marland Kitchen Chronic systolic heart failure   . Anemia, unspecified   . Paroxysmal supraventricular tachycardia   . Nonischemic cardiomyopathy     a. 2010-07-26 OOH VF arrest;  b. 07-26-10 Cath: nonobs dzs, EF 15-20%, glob HK;  c. 07-26-10 Echo: EF 15%, diff HK, Gr 2 DD;  d. 07-26-10 Cardiac MRI EF of 29%, no scar;  e. 2010/07/26 s/p SJM 3231-40 Bi V ICD Ser # [id number].  Marland Kitchen Atrial fibrillation     a. This occurred in setting of resuscitation efforts during VF arrest.  . Cardiac arrest     a. 07/26/10  . History of tobacco abuse   . History of cocaine abuse     remote  . Thyroid disease   . SUBSTANCE ABUSE, MULTIPLE 08/13/2010    Qualifier: Diagnosis of  By: Denyse Amass CMA, Carol    . Hypertension   . Depression     ok  . CHF (congestive heart failure)   . Shortness of breath     with exertion  . Pacemaker   . Automatic implantable cardioverter-defibrillator in situ   . GERD (gastroesophageal reflux disease)    Past Surgical History  Procedure Laterality Date  . Cardiac defibrillator placement      [facility] CD [id number] Q defib, serial number [id number]  . Cardiac defibrillator placement    . Open reduction internal fixation (orif) distal radial fracture Left 05/07/2014    Procedure: OPEN REDUCTION INTERNAL  FIXATION (ORIF) DISTAL RADIAL FRACTURE;  Surgeon: Jodi Marble, MD;  Location: [facility] OR;  Service: Orthopedics;  Laterality: Left;   Family History  Problem Relation Age of Onset  . Heart disease Father   . Hypertension Father   . Hypertension Mother   . Heart disease Mother    History  Substance Use Topics  . Smoking status: Former Smoker    Quit date: 07/28/2011  . Smokeless tobacco: Never Used  . Alcohol Use: No   ROS See pertinent in HPI  GENERAL: Well-developed, well-nourished female in no acute distress.  ABDOMEN: Soft, nontender, nondistended. No organomegaly. PELVIC: Normal external female genitalia. Vagina is pink and atrophic. Normal appearing cervix. IUD strings not visualized. Uterus is normal in size. No adnexal mass or tenderness. EXTREMITIES: No cyanosis, clubbing, or edema, 2+ distal pulses.    A/P 54 yo here for IUD check - IUD strings not visualized - Will order pelvic ultrasound for placement - Patient will be contacted with any abnormal results. - rtc prn

## 2015-05-08 ENCOUNTER — Encounter: Payer: Medicaid Other | Admitting: Internal Medicine

## 2015-05-09 ENCOUNTER — Ambulatory Visit (HOSPITAL_COMMUNITY): Admission: RE | Admit: 2015-05-09 | Payer: Medicaid Other | Source: Ambulatory Visit

## 2015-05-13 ENCOUNTER — Ambulatory Visit (HOSPITAL_COMMUNITY): Admission: RE | Admit: 2015-05-13 | Payer: Medicaid Other | Source: Ambulatory Visit

## 2015-07-10 ENCOUNTER — Encounter: Payer: Self-pay | Admitting: Family Medicine

## 2015-07-10 ENCOUNTER — Ambulatory Visit (INDEPENDENT_AMBULATORY_CARE_PROVIDER_SITE_OTHER): Payer: Medicaid Other | Admitting: Family Medicine

## 2015-07-10 VITALS — BP 150/84 | HR 78 | Temp 97.8°F | Ht 63.0 in | Wt 151.2 lb

## 2015-07-10 DIAGNOSIS — Z30432 Encounter for removal of intrauterine contraceptive device: Secondary | ICD-10-CM

## 2015-07-10 NOTE — Patient Instructions (Signed)
Menopause Menopause is the normal time of life when menstrual periods stop completely. Menopause is complete when you have missed 12 consecutive menstrual periods. It usually occurs between the ages of 48 years and 55 years. Very rarely does a woman develop menopause before the age of 40 years. At menopause, your ovaries stop producing the female hormones estrogen and progesterone. This can cause undesirable symptoms and also affect your health. Sometimes the symptoms may occur 4-5 years before the menopause begins. There is no relationship between menopause and:  Oral contraceptives.  Number of children you had.  Race.  The age your menstrual periods started (menarche). Heavy smokers and very thin women may develop menopause earlier in life. CAUSES  The ovaries stop producing the female hormones estrogen and progesterone.  Other causes include:  Surgery to remove both ovaries.  The ovaries stop functioning for no known reason.  Tumors of the pituitary gland in the brain.  Medical disease that affects the ovaries and hormone production.  Radiation treatment to the abdomen or pelvis.  Chemotherapy that affects the ovaries. SYMPTOMS   Hot flashes.  Night sweats.  Decrease in sex drive.  Vaginal dryness and thinning of the vagina causing painful intercourse.  Dryness of the skin and developing wrinkles.  Headaches.  Tiredness.  Irritability.  Memory problems.  Weight gain.  Bladder infections.  Hair growth of the face and chest.  Infertility. More serious symptoms include:  Loss of bone (osteoporosis) causing breaks (fractures).  Depression.  Hardening and narrowing of the arteries (atherosclerosis) causing heart attacks and strokes. DIAGNOSIS   When the menstrual periods have stopped for 12 straight months.  Physical exam.  Hormone studies of the blood. TREATMENT  There are many treatment choices and nearly as many questions about them. The  decisions to treat or not to treat menopausal changes is an individual choice made with your health care provider. Your health care provider can discuss the treatments with you. Together, you can decide which treatment will work best for you. Your treatment choices may include:   Hormone therapy (estrogen and progesterone).  Non-hormonal medicines.  Treating the individual symptoms with medicine (for example antidepressants for depression).  Herbal medicines that may help specific symptoms.  Counseling by a psychiatrist or psychologist.  Group therapy.  Lifestyle changes including:  Eating healthy.  Regular exercise.  Limiting caffeine and alcohol.  Stress management and meditation.  No treatment. HOME CARE INSTRUCTIONS   Take the medicine your health care provider gives you as directed.  Get plenty of sleep and rest.  Exercise regularly.  Eat a diet that contains calcium (good for the bones) and soy products (acts like estrogen hormone).  Avoid alcoholic beverages.  Do not smoke.  If you have hot flashes, dress in layers.  Take supplements, calcium, and vitamin D to strengthen bones.  You can use over-the-counter lubricants or moisturizers for vaginal dryness.  Group therapy is sometimes very helpful.  Acupuncture may be helpful in some cases. SEEK MEDICAL CARE IF:   You are not sure you are in menopause.  You are having menopausal symptoms and need advice and treatment.  You are still having menstrual periods after age 55 years.  You have pain with intercourse.  Menopause is complete (no menstrual period for 12 months) and you develop vaginal bleeding.  You need a referral to a specialist (gynecologist, psychiatrist, or psychologist) for treatment. SEEK IMMEDIATE MEDICAL CARE IF:   You have severe depression.  You have excessive vaginal bleeding.    You fell and think you have a broken bone.  You have pain when you urinate.  You develop leg or  chest pain.  You have a fast pounding heart beat (palpitations).  You have severe headaches.  You develop vision problems.  You feel a lump in your breast.  You have abdominal pain or severe indigestion. Document Released: 02/06/2004 Document Revised: 07/19/2013 Document Reviewed: 06/15/2013 ExitCare Patient Information 2015 [employer]. This information is not intended to replace advice given to you by your health care provider. Make sure you discuss any questions you have with your health care provider.  

## 2015-07-10 NOTE — Progress Notes (Signed)
    Subjective:    Patient ID: Angela Payne is a 54 y.o. female presenting with IUD removal  on 07/10/2015  HPI: Pt. Here for IUD removal.  She did not keep her last u/s appointment to confirm IUD insertion.  Review of Systems  Constitutional: Negative for fever and chills.  Respiratory: Negative for shortness of breath.   Cardiovascular: Negative for chest pain.  Gastrointestinal: Negative for nausea, vomiting and abdominal pain.  Genitourinary: Negative for dysuria.  Skin: Negative for rash.      Objective:    BP 150/84 mmHg  Pulse 78  Temp(Src) 97.8 F (36.6 C) (Oral)  Ht 5\' 3"  (1.6 m)  Wt 151 lb 3.2 oz (68.584 kg)  BMI 26.79 kg/m2 Physical Exam  Constitutional: She is oriented to person, place, and time. She appears well-developed and well-nourished. No distress.  HENT:  Head: Normocephalic and atraumatic.  Eyes: No scleral icterus.  Neck: Neck supple.  Cardiovascular: Normal rate.   Pulmonary/Chest: Effort normal.  Abdominal: Soft.  Neurological: She is alert and oriented to person, place, and time.  Skin: Skin is warm and dry.  Psychiatric: She has a normal mood and affect.    Procedure: Speculum placed inside vagina.  Cervix visualized. Cervix is stenotic. Small dilator used to open cervix. Cytobrush used to find strings.  Strings grasped with ring forceps.  IUD removed intact.      Assessment & Plan:    Encounter for IUD removal Return if symptoms worsen or fail to improve.   Domenique Southers S 07/10/2015 4:13 PM

## 2015-07-14 ENCOUNTER — Emergency Department (HOSPITAL_COMMUNITY)
Admission: EM | Admit: 2015-07-14 | Discharge: 2015-07-14 | Discharge status: Absent without leave | Payer: Medicaid Other

## 2015-08-13 ENCOUNTER — Other Ambulatory Visit: Payer: Self-pay

## 2015-08-13 ENCOUNTER — Encounter: Payer: Self-pay | Admitting: Internal Medicine

## 2015-08-13 ENCOUNTER — Ambulatory Visit (INDEPENDENT_AMBULATORY_CARE_PROVIDER_SITE_OTHER): Payer: Medicaid Other | Admitting: Internal Medicine

## 2015-08-13 VITALS — BP 148/86 | HR 63 | Ht 63.0 in | Wt 150.2 lb

## 2015-08-13 DIAGNOSIS — I5022 Chronic systolic (congestive) heart failure: Secondary | ICD-10-CM | POA: Diagnosis not present

## 2015-08-13 DIAGNOSIS — T82110A Breakdown (mechanical) of cardiac electrode, initial encounter: Secondary | ICD-10-CM | POA: Diagnosis not present

## 2015-08-13 DIAGNOSIS — I4901 Ventricular fibrillation: Secondary | ICD-10-CM | POA: Diagnosis not present

## 2015-08-13 DIAGNOSIS — Z95810 Presence of automatic (implantable) cardiac defibrillator: Secondary | ICD-10-CM

## 2015-08-13 DIAGNOSIS — I428 Other cardiomyopathies: Secondary | ICD-10-CM

## 2015-08-13 DIAGNOSIS — I429 Cardiomyopathy, unspecified: Secondary | ICD-10-CM | POA: Diagnosis not present

## 2015-08-13 HISTORY — DX: Breakdown (mechanical) of cardiac electrode, initial encounter: T82.110A

## 2015-08-13 LAB — CUP PACEART INCLINIC DEVICE CHECK
Battery Remaining Longevity: 24 mo
Brady Statistic RA Percent Paced: 1 %
Brady Statistic RV Percent Paced: 94 %
HighPow Impedance: 38 Ohm
Lead Channel Impedance Value: 337.5 Ohm
Lead Channel Pacing Threshold Amplitude: 0.75 V
Lead Channel Pacing Threshold Pulse Width: 0.5 ms
Lead Channel Pacing Threshold Pulse Width: 0.5 ms
Lead Channel Pacing Threshold Pulse Width: 0.5 ms
Lead Channel Pacing Threshold Pulse Width: 0.6 ms
Lead Channel Pacing Threshold Pulse Width: 0.6 ms
Lead Channel Sensing Intrinsic Amplitude: 12 mV
Lead Channel Sensing Intrinsic Amplitude: 2.8 mV
Lead Channel Setting Pacing Amplitude: 1.5 V
Lead Channel Setting Pacing Amplitude: 2.5 V
Lead Channel Setting Pacing Pulse Width: 0.6 ms
Lead Channel Setting Sensing Sensitivity: 0.5 mV
MDC IDC MSMT LEADCHNL LV IMPEDANCE VALUE: 912.5 Ohm
MDC IDC MSMT LEADCHNL LV PACING THRESHOLD AMPLITUDE: 1.25 V
MDC IDC MSMT LEADCHNL LV PACING THRESHOLD AMPLITUDE: 1.25 V
MDC IDC MSMT LEADCHNL RA PACING THRESHOLD AMPLITUDE: 0.5 V
MDC IDC MSMT LEADCHNL RV IMPEDANCE VALUE: 450 Ohm
MDC IDC MSMT LEADCHNL RV PACING THRESHOLD AMPLITUDE: 0.75 V
MDC IDC SESS DTM: 20160913104616
MDC IDC SET LEADCHNL RV PACING AMPLITUDE: 2.5 V
MDC IDC SET LEADCHNL RV PACING PULSEWIDTH: 0.5 ms
Pulse Gen Serial Number: 621486
Zone Setting Detection Interval: 250 ms
Zone Setting Detection Interval: 300 ms

## 2015-08-13 MED ORDER — CARVEDILOL 25 MG PO TABS: 25.0000 mg | ORAL_TABLET | Freq: Two times a day (BID) | ORAL | Status: DC

## 2015-08-13 NOTE — Patient Instructions (Signed)
Medication Instructions: - Increase coreg (carvedilol) to 25 mg one tablet by mouth twice daily  Labwork: - none  Procedures/Testing: - none  Follow-Up: - Remote monitoring is used to monitor your Pacemaker of ICD from home. This monitoring reduces the number of office visits required to check your device to one time per year. It allows [place] to keep an eye on the functioning of your device to ensure it is working properly. You are scheduled for a device check from home on 11/12/15. You may send your transmission at any time that day. If you have a wireless device, the transmission will be sent automatically. After your physician reviews your transmission, you will receive a postcard with your next transmission date.  - Your physician wants you to follow-up in: 1 year with Gypsy Balsam, NP for Dr. Graciela Husbands. You will receive a reminder letter in the mail two months in advance. If you don't receive a letter, please call our office to schedule the follow-up appointment.  Any Additional Special Instructions Will Be Listed Below (If Applicable). - none

## 2015-08-13 NOTE — Progress Notes (Signed)
Patient ID: Angela Payne MRN: [id number], DOB/AGE: 53-24-1962   Date of Visit: 08/13/2015  Primary Physician: Burtis Junes, MD Primary Cardiologist: [name], MD Primary EP: Graciela Husbands, MD Reason for Visit: EP/device follow-up  History of Present Illness  Angela Payne is a 54 y.o. female with aborted VF arrest, NICM, chronic systolic HF s/p CRT-D implant and PSVT who presents today for routine electrophysiology followup.   He palpitations. Intermittent modest dyspnea on exertion. Occasional peripheral edema. She notes no association between those 2; she also notes no association with sodium intake.  Echoc is ardiogram 5/16 demonstrated EF 35-40%-unchanged  Modest renal insufficiency creatinine 1.3 last assessed 3/16 at that point was up from 1.12     Past Medical History Past Medical History  Diagnosis Date  . Other left bundle branch block   . Automatic implantable cardiac defibrillator in situ     a. August 17, 2010 s/p  s/p SJM 3231-40 Bi V ICD Ser # [id number].  Marland Kitchen Chronic systolic heart failure   . Anemia, unspecified   . Paroxysmal supraventricular tachycardia   . Nonischemic cardiomyopathy     a. 08/17/2010 OOH VF arrest;  b. Aug 17, 2010 Cath: nonobs dzs, EF 15-20%, glob HK;  c. 08-17-10 Echo: EF 15%, diff HK, Gr 2 DD;  d. 17-Aug-2010 Cardiac MRI EF of 29%, no scar;  e. 2010-08-17 s/p SJM 3231-40 Bi V ICD Ser # [id number].  Marland Kitchen Atrial fibrillation     a. This occurred in setting of resuscitation efforts during VF arrest.  . Cardiac arrest     a. Aug 17, 2010  . History of tobacco abuse   . History of cocaine abuse     remote  . Thyroid disease   . SUBSTANCE ABUSE, MULTIPLE 08/13/2010    Qualifier: Diagnosis of  By: Denyse Amass CMA, Carol    . Hypertension   . Depression     ok  . CHF (congestive heart failure)   . Shortness of breath     with exertion  . Pacemaker   . Automatic implantable cardioverter-defibrillator in situ   . GERD (gastroesophageal reflux disease)     Past Surgical  History Past Surgical History  Procedure Laterality Date  . Cardiac defibrillator placement      [facility] CD [id number] Q defib, serial number [id number]  . Cardiac defibrillator placement    . Open reduction internal fixation (orif) distal radial fracture Left 05/07/2014    Procedure: OPEN REDUCTION INTERNAL FIXATION (ORIF) DISTAL RADIAL FRACTURE;  Surgeon: Jodi Marble, MD;  Location: [facility] OR;  Service: Orthopedics;  Laterality: Left;    Allergies/Intolerances No Known Allergies  Current Home Medications Current Outpatient Prescriptions  Medication Sig Dispense Refill  . albuterol (PROVENTIL HFA;VENTOLIN HFA) 108 (90 BASE) MCG/ACT inhaler Inhale 2 puffs into the lungs every 4 (four) hours as needed. For shortness of breath or wheezing    . aspirin 81 MG chewable tablet Chew 81 mg by mouth daily.    . carvedilol (COREG) 25 MG tablet Take 0.5 tablets (12.5 mg total) by mouth 2 (two) times daily with a meal. 30 tablet 6  . diphenhydrAMINE (BENADRYL) 25 MG tablet Take 1 tablet (25 mg total) by mouth every 6 (six) hours as needed for itching (Rash). 30 tablet 0  . furosemide (LASIX) 40 MG tablet Take 1 tablet (40 mg total) by mouth daily. 30 tablet 6  . ibuprofen (ADVIL,MOTRIN) 200 MG tablet Take 200 mg by mouth every 6 (six) hours as needed for mild pain.    Marland Kitchen  isosorbide-hydrALAZINE (BIDIL) 20-37.5 MG per tablet Take 1 tablet by mouth 2 (two) times daily. 180 tablet 3  . lisinopril (PRINIVIL,ZESTRIL) 5 MG tablet Take 1 tablet (5 mg total) by mouth daily. 30 tablet 6  . methocarbamol (ROBAXIN) 500 MG tablet Take 500 mg by mouth 2 (two) times daily as needed for muscle spasms.    . Multiple Vitamin (MULTIVITAMIN WITH MINERALS) TABS Take 1 tablet by mouth daily.    . naproxen (NAPROSYN) 500 MG tablet Take 500 mg by mouth 2 (two) times daily as needed (pain).    . ondansetron (ZOFRAN-ODT) 4 MG disintegrating tablet Take 1 tablet (4 mg total) by mouth every 8 (eight) hours as needed for nausea or  vomiting. 30 tablet 0  . polyethylene glycol (MIRALAX / GLYCOLAX) packet Take 17 g by mouth daily as needed (constipation).     . potassium chloride SA (K-DUR,KLOR-CON) 20 MEQ tablet Take 1 tablet (20 mEq total) by mouth daily. 30 tablet 6  . ranitidine (ZANTAC) 150 MG tablet Take 150 mg by mouth daily as needed for heartburn.    . traMADol (ULTRAM) 50 MG tablet Take 1 tablet (50 mg total) by mouth every 6 (six) hours as needed. FOR PAIN 30 tablet 0   No current facility-administered medications for this visit.    Social History Social History   Social History  . Marital Status: Married    Spouse Name: N/A  . Number of Children: 4  . Years of Education: N/A   Occupational History  . Disablity    Social History Main Topics  . Smoking status: Former Smoker    Quit date: 07/28/2011  . Smokeless tobacco: Never Used  . Alcohol Use: No  . Drug Use: No     Comment: history of cocaine abuse last used 2008  . Sexual Activity: Yes    Birth Control/ Protection: IUD   Other Topics Concern  . Not on file   Social History Narrative     Review of Systems General: No chills, fever, night sweats or weight changes Cardiovascular: No chest pain, dyspnea on exertion, edema, orthopnea, palpitations, paroxysmal nocturnal dyspnea Dermatological: No rash, lesions or masses Respiratory: No cough, dyspnea Urologic: No hematuria, dysuria Abdominal: No nausea, vomiting, diarrhea, bright red blood per rectum, melena, or hematemesis Neurologic: No visual changes, weakness, changes in mental status All other systems reviewed and are otherwise negative except as noted above.  Physical Exam Vitals: Blood pressure 148/86, pulse 63, height  (1.6 m), weight 150 lb 3.2 oz (68.13 kg).  General: Well developed, well appearing 54 y.o. female in no acute distress. HEENT: Normocephalic, atraumatic. EOMs intact. Sclera nonicteric. Oropharynx clear.  Neck: Supple. No JVD. Lungs: Respirations regular and  unlabored, CTA bilaterally. No wheezes, rales or rhonchi. Heart: RRR. S1, S2 present. No murmurs, rub, S3 or S4. Abdomen: Soft, non-distended.   Extremities: No clubbing, cyanosis or edema. PT/Radials 2+ and equal bilaterally. Psych: Normal affect. Neuro: Alert and oriented X 3. Moves all extremities spontaneously. Skin: Left upper chest / implant site intact and well healed.    ECG demonstrated AV pacing at 63 Intervals 15/13/45 S wave in lead 1  Assessment and Plan  Cardiac arrest-aborted   Nonischemic cardiomyopathy  Implantable defibrillator-CRT-[employer]  Hypertension  Renal insufficiceicny  Lead afailure  Euvolemic continue current meds  She does intermittently however accumulate edema. We discussed the importance of low-salt  Will increase carvedilol 12.5--25.      she said she was not able to  increase her lisinopril. There is an issue related to renal function; she was not able to stay today to get her renal function reassessed. She will need this done when she comes to see Dr. Colon Branch  There was noise on her defibrillator occurring 6/15. Recurrent on both leads. This would suggest external noise or device related issue; as it is not occurred subsequently, I suspect the former

## 2015-09-05 ENCOUNTER — Telehealth: Payer: Self-pay | Admitting: Cardiovascular Disease

## 2015-09-05 ENCOUNTER — Encounter: Payer: Medicaid Other | Admitting: Cardiovascular Disease

## 2015-09-05 ENCOUNTER — Other Ambulatory Visit: Payer: Medicaid Other

## 2015-09-05 NOTE — Progress Notes (Signed)
No show

## 2015-09-05 NOTE — Telephone Encounter (Signed)
No show for appt today

## 2015-09-08 ENCOUNTER — Encounter (HOSPITAL_COMMUNITY): Payer: Self-pay | Admitting: Emergency Medicine

## 2015-09-08 ENCOUNTER — Emergency Department (HOSPITAL_COMMUNITY)
Admission: EM | Admit: 2015-09-08 | Discharge: 2015-09-08 | Disposition: A | Discharge status: Home / Self Care | Payer: Medicaid Other | Attending: Emergency Medicine | Admitting: Emergency Medicine

## 2015-09-08 DIAGNOSIS — Z79899 Other long term (current) drug therapy: Secondary | ICD-10-CM | POA: Insufficient documentation

## 2015-09-08 DIAGNOSIS — K219 Gastro-esophageal reflux disease without esophagitis: Secondary | ICD-10-CM | POA: Insufficient documentation

## 2015-09-08 DIAGNOSIS — Z862 Personal history of diseases of the blood and blood-forming organs and certain disorders involving the immune mechanism: Secondary | ICD-10-CM | POA: Insufficient documentation

## 2015-09-08 DIAGNOSIS — I5022 Chronic systolic (congestive) heart failure: Secondary | ICD-10-CM | POA: Diagnosis not present

## 2015-09-08 DIAGNOSIS — Z7982 Long term (current) use of aspirin: Secondary | ICD-10-CM | POA: Diagnosis not present

## 2015-09-08 DIAGNOSIS — M25442 Effusion, left hand: Secondary | ICD-10-CM

## 2015-09-08 DIAGNOSIS — I10 Essential (primary) hypertension: Secondary | ICD-10-CM | POA: Insufficient documentation

## 2015-09-08 DIAGNOSIS — Z8639 Personal history of other endocrine, nutritional and metabolic disease: Secondary | ICD-10-CM | POA: Diagnosis not present

## 2015-09-08 DIAGNOSIS — M79642 Pain in left hand: Secondary | ICD-10-CM | POA: Diagnosis present

## 2015-09-08 DIAGNOSIS — F329 Major depressive disorder, single episode, unspecified: Secondary | ICD-10-CM | POA: Insufficient documentation

## 2015-09-08 DIAGNOSIS — R2232 Localized swelling, mass and lump, left upper limb: Secondary | ICD-10-CM | POA: Diagnosis not present

## 2015-09-08 DIAGNOSIS — Z87891 Personal history of nicotine dependence: Secondary | ICD-10-CM | POA: Diagnosis not present

## 2015-09-08 DIAGNOSIS — Z950 Presence of cardiac pacemaker: Secondary | ICD-10-CM | POA: Diagnosis not present

## 2015-09-08 MED ORDER — HYDROCORTISONE 1 % EX CREA: TOPICAL_CREAM | CUTANEOUS | Status: DC

## 2015-09-08 MED ORDER — BACITRACIN ZINC 500 UNIT/GM EX OINT: 1.0000 "application " | TOPICAL_OINTMENT | Freq: Two times a day (BID) | CUTANEOUS | Status: DC

## 2015-09-08 NOTE — ED Notes (Signed)
Pt. Stated, this morning my left finger tip was itching and then it started to swell.

## 2015-09-08 NOTE — ED Provider Notes (Signed)
CSN: [id number]     Arrival date & time 09/08/15  0941 History  By signing my name below, I, Arianna Nassar, attest that this documentation has been prepared under the direction and in the presence of Jamarco Zaldivar, PA-C. Electronically Signed: Octavia Heir, ED Scribe. 09/08/2015. 10:08 AM.     Chief Complaint  Patient presents with  . Hand Pain    index finger pain     The history is provided by the patient. No language interpreter was used.   HPI Comments: Angela Payne is a 54 y.o. female who presents to the Emergency Department complaining of sudden onset, constant left index finger pain that occurred this morning. Pt notes this morning her left finger tip and finger nail was itching and now her finger is swollen and "hard". She reports digging in her pocket book this morning when it feels as if "something went in her finger". No numbness. Not itching any longer. No drainage. Pt made a small cut over the swelling area to see if she can see anything in it   Past Medical History  Diagnosis Date  . Other left bundle branch block   . Automatic implantable cardiac defibrillator in situ     a. 06/2010 s/p  s/p SJM 3231-40 Bi V ICD Ser # [id number].  Marland Kitchen Chronic systolic heart failure (HCC)   . Anemia, unspecified   . Paroxysmal supraventricular tachycardia (HCC)   . Nonischemic cardiomyopathy (HCC)     a. 06/2010 OOH VF arrest;  b. 06/2010 Cath: nonobs dzs, EF 15-20%, glob HK;  c. 06/2010 Echo: EF 15%, diff HK, Gr 2 DD;  d. 06/2010 Cardiac MRI EF of 29%, no scar;  e. 06/2010 s/p SJM 3231-40 Bi V ICD Ser # [id number].  Marland Kitchen Atrial fibrillation (HCC)     a. This occurred in setting of resuscitation efforts during VF arrest.  . Cardiac arrest (HCC)     a. 06/2010  . History of tobacco abuse   . History of cocaine abuse     remote  . Thyroid disease   . SUBSTANCE ABUSE, MULTIPLE 08/13/2010    Qualifier: Diagnosis of  By: Denyse Amass CMA, Carol    . Hypertension   . Depression     ok  . CHF (congestive  heart failure) (HCC)   . Shortness of breath     with exertion  . Pacemaker   . Automatic implantable cardioverter-defibrillator in situ   . GERD (gastroesophageal reflux disease)   . ICD (implantable cardioverter-defibrillator) lead failure--noise of both leads 6/15 isolated 08/13/2015   Past Surgical History  Procedure Laterality Date  . Cardiac defibrillator placement      [facility] [place] CD [id number] Q defib, serial number [id number]  . Cardiac defibrillator placement    . Open reduction internal fixation (orif) distal radial fracture Left 05/07/2014    Procedure: OPEN REDUCTION INTERNAL FIXATION (ORIF) DISTAL RADIAL FRACTURE;  Surgeon: Jodi Marble, MD;  Location: [facility] OR;  Service: Orthopedics;  Laterality: Left;   Family History  Problem Relation Age of Onset  . Heart disease Father   . Hypertension Father   . Hypertension Mother   . Heart disease Mother    Social History  Substance Use Topics  . Smoking status: Former Smoker    Quit date: 07/28/2011  . Smokeless tobacco: Never Used  . Alcohol Use: No   OB History    Gravida Para Term Preterm AB TAB SAB Ectopic Multiple Living   4 4 4  4     Review of Systems  Musculoskeletal:       Left index finger pain  Skin: Positive for rash.      Allergies  Review of patient's allergies indicates no known allergies.  Home Medications   Prior to Admission medications   Medication Sig Start Date End Date Taking? Authorizing Provider  albuterol (PROVENTIL HFA;VENTOLIN HFA) 108 (90 BASE) MCG/ACT inhaler Inhale 2 puffs into the lungs every 4 (four) hours as needed. For shortness of breath or wheezing    Historical Provider, MD  aspirin 81 MG chewable tablet Chew 81 mg by mouth daily.    Historical Provider, MD  carvedilol (COREG) 25 MG tablet Take 1 tablet (25 mg total) by mouth 2 (two) times daily with a meal. 08/13/15   Duke Salvia, MD  diphenhydrAMINE (BENADRYL) 25 MG tablet Take 1 tablet (25 mg total) by mouth every 6  (six) hours as needed for itching (Rash). 05/27/13   Antony Madura, PA-C  furosemide (LASIX) 40 MG tablet Take 1 tablet (40 mg total) by mouth daily. 09/19/14   Kathleene Hazel, MD  ibuprofen (ADVIL,MOTRIN) 200 MG tablet Take 200 mg by mouth every 6 (six) hours as needed for mild pain.    Historical Provider, MD  isosorbide-hydrALAZINE (BIDIL) 20-37.5 MG per tablet Take 1 tablet by mouth 2 (two) times daily. 11/10/12   Duke Salvia, MD  lisinopril (PRINIVIL,ZESTRIL) 5 MG tablet Take 1 tablet (5 mg total) by mouth daily. 09/19/14   Kathleene Hazel, MD  methocarbamol (ROBAXIN) 500 MG tablet Take 500 mg by mouth 2 (two) times daily as needed for muscle spasms.    Historical Provider, MD  Multiple Vitamin (MULTIVITAMIN WITH MINERALS) TABS Take 1 tablet by mouth daily.    Historical Provider, MD  naproxen (NAPROSYN) 500 MG tablet Take 500 mg by mouth 2 (two) times daily as needed (pain).    Historical Provider, MD  ondansetron (ZOFRAN-ODT) 4 MG disintegrating tablet Take 1 tablet (4 mg total) by mouth every 8 (eight) hours as needed for nausea or vomiting. 08/08/14   Ozella Rocks, MD  polyethylene glycol [facility] Ethelene Hal) packet Take 17 g by mouth daily as needed (constipation).  10/28/12   Rolan Bucco, MD  potassium chloride SA (K-DUR,KLOR-CON) 20 MEQ tablet Take 1 tablet (20 mEq total) by mouth daily. 09/19/14   Kathleene Hazel, MD  ranitidine (ZANTAC) 150 MG tablet Take 150 mg by mouth daily as needed for heartburn.    Historical Provider, MD  traMADol (ULTRAM) 50 MG tablet Take 1 tablet (50 mg total) by mouth every 6 (six) hours as needed. FOR PAIN 01/07/14   Linwood Dibbles, MD   Triage vitals: BP 159/61 mmHg  Pulse 85  Temp(Src) 97.6 F (36.4 C)  Resp 17  Ht 5\' 3"  (1.6 m)  Wt 150 lb 5 oz (68.181 kg)  BMI 26.63 kg/m2  SpO2 100% Physical Exam  Constitutional: She appears well-developed and well-nourished. No distress.  HENT:  Head: Normocephalic and atraumatic.  Eyes:  Right eye exhibits no discharge. Left eye exhibits no discharge.  Pulmonary/Chest: Effort normal. No respiratory distress.  Musculoskeletal:  Tiny laceration, superficial to the distal left index finger, there is surrounding erythema, diameter less than 1 cm. No induration. Nail is normal. No drainage.  Neurological: She is alert. Coordination normal.  Skin: No rash noted. She is not diaphoretic. There is erythema.  Psychiatric: She has a normal mood and affect. Her behavior is normal.  Nursing note and vitals reviewed.   ED Course  Procedures  DIAGNOSTIC STUDIES: Oxygen Saturation is 100% on RA, normal by my interpretation.  COORDINATION OF CARE:  10:08 AM Discussed treatment plan which includes soak finger in warm soapy water and use hydrocortisone cream with pt at bedside and pt agreed to plan.  Labs Review Labs Reviewed - No data to display  Imaging Review No results found. I have personally reviewed and evaluated these images and lab results as part of my medical decision-making.   EKG Interpretation None      MDM   Final diagnoses:  Finger joint swelling, left   Patient with mild swelling to the distal tip of left index finger. States started after she was going through her purse with her hand. Question tiny splinter versus insect bite. There is no tenderness to palpation over the area. There is no drainage. Swelling does not cross DIP joint. Will start of bacitracin and hydrocortisone cream. Soaks. Follow-up as needed.  Filed Vitals:   09/08/15 0947  BP: 159/61  Pulse: 85  Temp: 97.6 F (36.4 C)  Resp: 17  Height:  (1.6 m)  Weight: 150 lb 5 oz (68.181 kg)  SpO2: 100%   I personally performed the services described in this documentation, which was scribed in my presence. The recorded information has been reviewed and is accurate.   Jaynie Crumble, PA-C 09/08/15 1021  Nelva Nay, MD 09/08/15 1534

## 2015-09-08 NOTE — Discharge Instructions (Signed)
Bacitracin and hydrocortisone cream as prescribed. Warm water soaks several times a day. Follow-up as needed. Return if swelling is worsening.

## 2015-09-12 ENCOUNTER — Encounter: Payer: Self-pay | Admitting: Cardiovascular Disease

## 2015-10-30 ENCOUNTER — Other Ambulatory Visit: Payer: Self-pay

## 2015-10-30 MED ORDER — FUROSEMIDE 40 MG PO TABS: 40.0000 mg | ORAL_TABLET | Freq: Every day | ORAL | Status: DC

## 2015-11-12 ENCOUNTER — Encounter: Payer: Medicaid Other | Admitting: *Deleted

## 2015-11-12 ENCOUNTER — Telehealth: Payer: Self-pay | Admitting: Cardiology

## 2015-11-12 NOTE — Telephone Encounter (Signed)
LMOVM reminding pt to send remote transmission.   

## 2015-11-13 ENCOUNTER — Encounter: Payer: Self-pay | Admitting: Cardiology

## 2015-11-14 ENCOUNTER — Telehealth: Payer: Self-pay | Admitting: Cardiovascular Disease

## 2015-11-14 ENCOUNTER — Ambulatory Visit (INDEPENDENT_AMBULATORY_CARE_PROVIDER_SITE_OTHER): Payer: Medicaid Other | Admitting: *Deleted

## 2015-11-14 ENCOUNTER — Encounter: Payer: Self-pay | Admitting: Internal Medicine

## 2015-11-14 VITALS — BP 138/86 | HR 63

## 2015-11-14 DIAGNOSIS — I4901 Ventricular fibrillation: Secondary | ICD-10-CM | POA: Diagnosis not present

## 2015-11-14 DIAGNOSIS — T82110A Breakdown (mechanical) of cardiac electrode, initial encounter: Secondary | ICD-10-CM | POA: Diagnosis not present

## 2015-11-14 DIAGNOSIS — I5022 Chronic systolic (congestive) heart failure: Secondary | ICD-10-CM | POA: Diagnosis not present

## 2015-11-14 LAB — CUP PACEART INCLINIC DEVICE CHECK
Battery Remaining Longevity: 22.8
Date Time Interrogation Session: 20161215125621
HighPow Impedance: 39.6272
Implantable Lead Implant Date: 20110819
Implantable Lead Location: 753859
Implantable Lead Model: 7121
Lead Channel Impedance Value: 337.5 Ohm
Lead Channel Impedance Value: 900 Ohm
Lead Channel Pacing Threshold Amplitude: 0.5 V
Lead Channel Pacing Threshold Amplitude: 0.75 V
Lead Channel Pacing Threshold Amplitude: 1.25 V
Lead Channel Pacing Threshold Pulse Width: 0.5 ms
Lead Channel Pacing Threshold Pulse Width: 0.5 ms
Lead Channel Pacing Threshold Pulse Width: 0.6 ms
Lead Channel Pacing Threshold Pulse Width: 0.6 ms
Lead Channel Sensing Intrinsic Amplitude: 12 mV
Lead Channel Sensing Intrinsic Amplitude: 2.5 mV
Lead Channel Setting Pacing Amplitude: 2.5 V
Lead Channel Setting Pacing Amplitude: 2.5 V
Lead Channel Setting Pacing Pulse Width: 0.5 ms
Lead Channel Setting Pacing Pulse Width: 0.6 ms
MDC IDC LEAD IMPLANT DT: 20110819
MDC IDC LEAD IMPLANT DT: 20110819
MDC IDC LEAD LOCATION: 753858
MDC IDC LEAD LOCATION: 753860
MDC IDC MSMT LEADCHNL LV PACING THRESHOLD AMPLITUDE: 1.25 V
MDC IDC MSMT LEADCHNL RA PACING THRESHOLD AMPLITUDE: 0.5 V
MDC IDC MSMT LEADCHNL RA PACING THRESHOLD PULSEWIDTH: 0.5 ms
MDC IDC MSMT LEADCHNL RV IMPEDANCE VALUE: 462.5 Ohm
MDC IDC MSMT LEADCHNL RV PACING THRESHOLD AMPLITUDE: 0.75 V
MDC IDC MSMT LEADCHNL RV PACING THRESHOLD PULSEWIDTH: 0.5 ms
MDC IDC PG SERIAL: 621486
MDC IDC SET LEADCHNL RA PACING AMPLITUDE: 1.625
MDC IDC SET LEADCHNL RV SENSING SENSITIVITY: 0.5 mV
MDC IDC STAT BRADY RA PERCENT PACED: 3.4 %
MDC IDC STAT BRADY RV PERCENT PACED: 97 %

## 2015-11-14 NOTE — Telephone Encounter (Signed)
New message ° ° ° ° ° °Talk to the nurse.  She would not tell me what she wanted °

## 2015-11-14 NOTE — Progress Notes (Signed)
CRT-D device check in office. Thresholds and sensing consistent with previous device measurements. Lead impedance trends stable over time. No mode switch episodes recorded. 1 ventricular arrhythmia episode recorded- 15 seconds, pk V , EGM shows ? AVNRT- will have SK review. Patient bi-ventricularly pacing 97% of the time. Device programmed with appropriate safety margins. Heart failure diagnostics reviewed- corvue abnormal mid Dec x 8 days. Audible/vibratory alerts not demonstrated for patient per request. No changes made this session. Estimated longevity 1.9 years.  Patient enrolled in remote follow up- demonstrated merlin monitor + cell adapter. Explained importance of remote monitoring due to [facility]. Data processing manager. Pt verbalizes understanding.

## 2015-11-14 NOTE — Telephone Encounter (Signed)
LVMTCB

## 2015-11-15 ENCOUNTER — Other Ambulatory Visit: Payer: Self-pay | Admitting: *Deleted

## 2015-11-15 MED ORDER — POTASSIUM CHLORIDE CRYS ER 20 MEQ PO TBCR: 20.0000 meq | EXTENDED_RELEASE_TABLET | Freq: Every day | ORAL | Status: DC

## 2015-11-15 NOTE — Telephone Encounter (Signed)
Spoke with pt and told her we did not prescribe Naproxen for her.  She realized it was filled by her primary care doctor and will contact them.  Also discussed symptoms below of dizziness and some blurred vision.  I asked her to contact primary care for evaluation of these symptoms and she is agreeable with this plan.

## 2015-11-15 NOTE — Telephone Encounter (Signed)
Intermittent dizziness and blurred Vision for past couple months This week has been pretty constant No weakness of arms and legs Right ear, c/o little problem, "pops" a lot. Is wondering if it is her glasses; she keeps changing them around a lot Is going to get her eyes checked.  Denies any headache, difficulty with speech or any unilateral weakness or numbness.  No changes in diet or medications  Instructed if symptoms become worse, has any difficulty with speech or unilateral extremity numbness or weakness she needs to seek medical care immediatly. Refilled potassium RX  Routing to Dr. Rich Reining

## 2015-11-15 NOTE — Telephone Encounter (Signed)
New Message   *STAT* If patient is at the pharmacy, call can be transferred to refill team.   1. Which medications need to be refilled? (please list name of each medication and dose if known) naproxen (NAPROSYN) 500 MG tablet   2. Which pharmacy/location (including street and city if local pharmacy) is medication to be sent to? [facility] on [address]  3. Do they need a 30 day or 90 day supply? 90 day supply

## 2015-11-26 NOTE — Telephone Encounter (Signed)
Agree. Thanks, chris 

## 2015-11-28 ENCOUNTER — Emergency Department (HOSPITAL_COMMUNITY)
Admission: EM | Admit: 2015-11-28 | Discharge: 2015-11-28 | Disposition: A | Discharge status: Home / Self Care | Payer: Medicaid Other

## 2015-11-28 NOTE — ED Notes (Signed)
Called for triage x 1 no answer in lobby 

## 2015-12-03 ENCOUNTER — Telehealth: Payer: Self-pay | Admitting: Internal Medicine

## 2015-12-03 NOTE — Telephone Encounter (Signed)
Patient c/o a new "dip" at the top of her CRT-D site. Patient states that she assesses the area 3-4 x's a week and just noticed this change. Patient denies any fever or chills. Patient denies any swelling or other visual changes in device area. An appt was made for pt to see the device clinic on 1/11 @ 0930 for site assessment.  Patient encouraged to set up her Engineer, materials.  I encouraged patient to call back if she notices any other changes prior to her appt next week. Patient voiced understanding.

## 2015-12-03 NOTE — Telephone Encounter (Signed)
New Message   Pt stated that her defib has "changed shape". Please call back and discuss.

## 2015-12-11 ENCOUNTER — Ambulatory Visit: Payer: Medicaid Other

## 2016-01-03 ENCOUNTER — Encounter: Payer: Self-pay | Admitting: Internal Medicine

## 2016-02-05 ENCOUNTER — Encounter: Payer: Self-pay | Admitting: Internal Medicine

## 2016-02-19 ENCOUNTER — Encounter: Payer: Self-pay | Admitting: Internal Medicine

## 2016-02-19 ENCOUNTER — Ambulatory Visit (INDEPENDENT_AMBULATORY_CARE_PROVIDER_SITE_OTHER): Payer: Medicaid Other | Admitting: *Deleted

## 2016-02-19 DIAGNOSIS — I5022 Chronic systolic (congestive) heart failure: Secondary | ICD-10-CM

## 2016-02-19 DIAGNOSIS — I429 Cardiomyopathy, unspecified: Secondary | ICD-10-CM | POA: Diagnosis not present

## 2016-02-19 DIAGNOSIS — I428 Other cardiomyopathies: Secondary | ICD-10-CM

## 2016-02-19 LAB — CUP PACEART INCLINIC DEVICE CHECK
Brady Statistic RV Percent Paced: 96 %
HIGH POWER IMPEDANCE MEASURED VALUE: 41.0431
Implantable Lead Implant Date: 20110819
Implantable Lead Location: 753858
Implantable Lead Location: 753859
Implantable Lead Location: 753860
Implantable Lead Model: 7121
Lead Channel Impedance Value: 312.5 Ohm
Lead Channel Impedance Value: 462.5 Ohm
Lead Channel Impedance Value: 950 Ohm
Lead Channel Pacing Threshold Amplitude: 0.75 V
Lead Channel Pacing Threshold Amplitude: 1.25 V
Lead Channel Pacing Threshold Pulse Width: 0.5 ms
Lead Channel Sensing Intrinsic Amplitude: 2.6 mV
Lead Channel Setting Pacing Amplitude: 2.5 V
Lead Channel Setting Pacing Amplitude: 2.5 V
Lead Channel Setting Pacing Pulse Width: 0.5 ms
Lead Channel Setting Pacing Pulse Width: 0.6 ms
MDC IDC LEAD IMPLANT DT: 20110819
MDC IDC LEAD IMPLANT DT: 20110819
MDC IDC MSMT BATTERY REMAINING LONGEVITY: 21.6
MDC IDC MSMT LEADCHNL LV PACING THRESHOLD AMPLITUDE: 1.25 V
MDC IDC MSMT LEADCHNL LV PACING THRESHOLD PULSEWIDTH: 0.6 ms
MDC IDC MSMT LEADCHNL LV PACING THRESHOLD PULSEWIDTH: 0.6 ms
MDC IDC MSMT LEADCHNL RA PACING THRESHOLD AMPLITUDE: 0.5 V
MDC IDC MSMT LEADCHNL RA PACING THRESHOLD PULSEWIDTH: 0.5 ms
MDC IDC MSMT LEADCHNL RV PACING THRESHOLD AMPLITUDE: 0.75 V
MDC IDC MSMT LEADCHNL RV PACING THRESHOLD PULSEWIDTH: 0.5 ms
MDC IDC MSMT LEADCHNL RV SENSING INTR AMPL: 12 mV
MDC IDC SESS DTM: 20170322105125
MDC IDC SET LEADCHNL RA PACING AMPLITUDE: 1.5 V
MDC IDC SET LEADCHNL RV SENSING SENSITIVITY: 0.5 mV
MDC IDC STAT BRADY RA PERCENT PACED: 0.88 %
Pulse Gen Serial Number: 621486

## 2016-02-19 NOTE — Progress Notes (Signed)
CRT-D device check in office. Thresholds and sensing consistent with previous device measurements. Lead impedance trends stable over time. (1) mode switch episode recorded (<1%) x 8 sec @ 244/93--AT/AFL. No ventricular arrhythmia episodes recorded. Patient bi-ventricularly pacing 96% of the time. Device programmed with appropriate safety margins. Heart failure diagnostics reviewed and trends are stable for patient. Vibratory alerts demonstrated for patient. No changes made this session. Estimated longevity 1.8 years. Patient education completed including shock plan. Patient education also completed regarding device advisory and importance of remote monitoring, but patient states that she prefers ov. Device site assessed and area is free of redness, edema, or ecchymosis. Patient will follow up with the Device Clinic in 3 months and with SK in 07-2016. 

## 2016-02-19 NOTE — Patient Instructions (Signed)
CRT-D device check in office. Thresholds and sensing consistent with previous device measurements. Lead impedance trends stable over time. (1) mode switch episode recorded (<1%) x 8 sec @ 244/93--AT/AFL. No ventricular arrhythmia episodes recorded. Patient bi-ventricularly pacing 96% of the time. Device programmed with appropriate safety margins. Heart failure diagnostics reviewed and trends are stable for patient. Vibratory alerts demonstrated for patient. No changes made this session. Estimated longevity 1.8 years. Patient education completed including shock plan. Patient education also completed regarding device advisory and importance of remote monitoring, but patient states that she prefers ov. Device site assessed and area is free of redness, edema, or ecchymosis. Patient will follow up with the Device Clinic in 3 months and with SK in 07-2016.

## 2016-04-18 ENCOUNTER — Other Ambulatory Visit: Payer: Self-pay | Admitting: Cardiovascular Disease

## 2016-05-27 ENCOUNTER — Encounter: Payer: Self-pay | Admitting: Internal Medicine

## 2016-07-02 ENCOUNTER — Telehealth: Payer: Self-pay | Admitting: Cardiovascular Disease

## 2016-07-02 ENCOUNTER — Ambulatory Visit: Payer: Medicaid Other | Admitting: Cardiovascular Disease

## 2016-07-02 NOTE — Telephone Encounter (Signed)
New message      The pt feels it time for her to have surgery to get her battery changed in her device.   She would like to be called the pt is uncertain. No other information provided   The pt wants to speak with the nurse

## 2016-07-02 NOTE — Progress Notes (Deleted)
No chief complaint on file.  History of Present Illness: [name] with h/o NICM, prior VF arrest 2011-09-16PAF here today for cardiac follow up. She was admitted to [name] Sep 16, 2011after an out of hospital V. Fib arrest. She was cooled on the Arctic sun protocol and underwent a cardiac cath which showed no evidence of obstructive CAD. Echo showed EF of 15%. Cardiac MRI showed EF of 29%. 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 She had an ICD implanted by Dr. Graciela Payne on July 18, 2010. She has had chronic chest pain since her cardiac arrest. Echo May 2014 with LVEF=50%. She has been seen in the CHF clinic and has been released.   She is here for folllow up. No exertional chest pain. Weight is stable. No near syncope, syncope, orthopnea or PND. She has been taking all of her medications. She uses an extra Lasix if needed for LE swelling.   Primary Care Physician:  Angela Junes, MD (Inactive)   Past Medical History:  Diagnosis Date  . Anemia, unspecified   . Atrial fibrillation (HCC)    a. This occurred in setting of resuscitation efforts during VF arrest.  . Automatic implantable cardiac defibrillator in situ    a. 08/15/10 s/p  s/p SJM 3231-40 Bi V ICD Ser # [id number].  Marland Kitchen Automatic implantable cardioverter-defibrillator in situ   . Cardiac arrest (HCC)    a. 2010-08-15  . CHF (congestive heart failure) (HCC)   . Chronic systolic heart failure (HCC)   . Depression    ok  . GERD (gastroesophageal reflux disease)   . History of cocaine abuse    remote  . History of tobacco abuse   . Hypertension   . ICD (implantable cardioverter-defibrillator) lead failure--noise of both leads 6/15 isolated 08/13/2015  . Nonischemic cardiomyopathy (HCC)    a. 2010-08-15 OOH VF arrest;  b. 08/15/2010 Cath: nonobs dzs, EF 15-20%, glob HK;  c. 2010/08/15 Echo: EF 15%, diff HK, Gr 2 DD;  d. 15-Aug-2010 Cardiac MRI EF of 29%, no scar;  e. 08-15-2010 s/p SJM  3231-40 Bi V ICD Ser # [id number].  Marland Kitchen Other left bundle branch block   . Pacemaker   . Paroxysmal supraventricular tachycardia (HCC)   . Shortness of breath    with exertion  . SUBSTANCE ABUSE, MULTIPLE 08/13/2010   Qualifier: Diagnosis of  By: Angela Payne, Angela Payne    . Thyroid disease     Past Surgical History:  Procedure Laterality Date  . CARDIAC DEFIBRILLATOR PLACEMENT     [address] [place] CD [id number] Q defib, serial number [id number]  . CARDIAC DEFIBRILLATOR PLACEMENT    . OPEN REDUCTION INTERNAL FIXATION (ORIF) DISTAL RADIAL FRACTURE Left 05/07/2014   Procedure: OPEN REDUCTION INTERNAL FIXATION (ORIF) DISTAL RADIAL FRACTURE;  Surgeon: Angela Marble, MD;  Location: [facility] OR;  Service: Orthopedics;  Laterality: Left;    Current Outpatient Prescriptions  Medication Sig Dispense Refill  . albuterol (PROVENTIL HFA;VENTOLIN HFA) 108 (90 BASE) MCG/ACT inhaler Inhale 2 puffs into the lungs every 4 (four) hours as needed. For shortness of breath or wheezing    . aspirin 81 MG chewable tablet Chew 81 mg by mouth daily.    . bacitracin ointment Apply 1 application topically 2 (two) times daily. 120 g 0  . carvedilol (COREG) 25 MG tablet Take 1 tablet (25 mg total) by mouth 2 (two) times daily with a meal. 30 tablet 6  .  diphenhydrAMINE (BENADRYL) 25 MG tablet Take 1 tablet (25 mg total) by mouth every 6 (six) hours as needed for itching (Rash). 30 tablet 0  . furosemide (LASIX) 40 MG tablet Take 1 tablet (40 mg total) by mouth daily. 30 tablet 11  . hydrocortisone cream 1 % Apply to affected area 2 times daily 15 g 0  . ibuprofen (ADVIL,MOTRIN) 200 MG tablet Take 200 mg by mouth every 6 (six) hours as needed for mild pain.    . isosorbide-hydrALAZINE (BIDIL) 20-37.5 MG per tablet Take 1 tablet by mouth 2 (two) times daily. 180 tablet 3  . lisinopril (PRINIVIL,ZESTRIL) 5 MG tablet take 1 tablet by mouth once daily 30 tablet 2  . methocarbamol (ROBAXIN) 500 MG tablet Take 500 mg by mouth 2 (two) times daily as  needed for muscle spasms.    . Multiple Vitamin (MULTIVITAMIN WITH MINERALS) TABS Take 1 tablet by mouth daily.    . naproxen (NAPROSYN) 500 MG tablet Take 500 mg by mouth 2 (two) times daily as needed (pain).    . ondansetron (ZOFRAN-ODT) 4 MG disintegrating tablet Take 1 tablet (4 mg total) by mouth every 8 (eight) hours as needed for nausea or vomiting. 30 tablet 0  . polyethylene glycol (MIRALAX / GLYCOLAX) packet Take 17 g by mouth daily as needed (constipation).     . potassium chloride SA (K-DUR,KLOR-CON) 20 MEQ tablet Take 1 tablet (20 mEq total) by mouth daily. 30 tablet 9  . ranitidine (ZANTAC) 150 MG tablet Take 150 mg by mouth daily as needed for heartburn.    . traMADol (ULTRAM) 50 MG tablet Take 1 tablet (50 mg total) by mouth every 6 (six) hours as needed. FOR PAIN 30 tablet 0   No current facility-administered medications for this visit.     No Known Allergies  Social History   Social History  . Marital status: Married    Spouse name: N/A  . Number of children: 4  . Years of education: N/A   Occupational History  . Disablity    Social History Main Topics  . Smoking status: Former Smoker    Quit date: 07/28/2011  . Smokeless tobacco: Never Used  . Alcohol use No  . Drug use: No     Comment: history of cocaine abuse last used 2008  . Sexual activity: Yes    Birth control/ protection: IUD   Other Topics Concern  . Not on file   Social History Narrative  . No narrative on file    Family History  Problem Relation Age of Onset  . Heart disease Father   . Hypertension Father   . Hypertension Mother   . Heart disease Mother     Review of Systems:  As stated in the HPI and otherwise negative.   There were no vitals taken for this visit.  Physical Examination: General: Well developed, well nourished, NAD  HEENT: OP clear, mucus membranes moist  SKIN: warm, dry. No rashes. Neuro: No focal deficits  Musculoskeletal: Muscle strength 5/5 all ext    Psychiatric: Mood and affect normal  Neck: No JVD, no carotid bruits, no thyromegaly, no lymphadenopathy.  Lungs:Clear bilaterally, no wheezes, rhonci, crackles Cardiovascular: Regular rate and rhythm. No murmurs, gallops or rubs. Abdomen:Soft. Bowel sounds present. Non-tender.  Extremities: No lower extremity edema. Pulses are 2 + in the bilateral DP/PT.  Echo May 2014: Left ventricle: The cavity size was normal. Wall thickness was normal. Systolic function was mildly to moderately reduced. The estimated ejection  fraction was in the range of 40% to 45% (50% by overread of Dr. Gala Romney). Features are consistent with a pseudonormal left ventricular filling pattern, with concomitant abnormal relaxation and increased filling pressure (grade 2 diastolic dysfunction). - Pulmonary arteries: PA peak pressure: 32mm Hg (S).  EKG:  EKG is not *** ordered today.  Recent Labs: No results found for requested labs within last 8760 hours.   Lipid Panel    Component Value Date/Time   CHOL 179 11/10/2012 1402   TRIG 97.0 11/10/2012 1402   HDL 34.80 (L) 11/10/2012 1402   CHOLHDL 5 11/10/2012 1402   VLDL 19.4 11/10/2012 1402   LDLCALC 125 (H) 11/10/2012 1402     Wt Readings from Last 3 Encounters:  09/08/15 150 lb 5 oz (68.2 kg)  08/13/15 150 lb 3.2 oz (68.1 kg)  07/10/15 151 lb 3.2 oz (68.6 kg)     Other studies Reviewed: Additional studies/ records that were reviewed today include: ED records   Assessment and Plan:   1. Non ischemic cardiomyopathy: Echo May 2014 with LVEF=50%. She is on good medical therapy. Blood pressure is well controlled. No changes in therapy today.   2. SYSTOLIC HEART FAILURE, CHRONIC: Volume status is normal today. Weight has been stable.Continue to follow daily weights at home.  Will continue Lasix daily and potassium supplementation. Dr. Graciela Payne follows her ICD.   3. PAF: No recurrence.   4. History of cardiac arrest: ICD in place.   5. Substance abuse:  *** She has not used cocaine in the last 2 years.   Current medicines are reviewed at length with the patient today.  The patient does not have concerns regarding medicines.  The following changes have been made:  no change  Labs/ tests ordered today include: echo  No orders of the defined types were placed in this encounter.   Disposition:   FU with me in 6 months  Signed, Verne Carrow, MD 07/02/2016 7:41 AM    [facility] [address] [place], [place], [place]  [address] Phone: [phone]; Fax: [phone]

## 2016-07-02 NOTE — Telephone Encounter (Signed)
Informed patient that her device still had 21 months remaining on her battery in 01-2016. Patient voiced understanding.  Patient also asked about her upcoming follow up. I informed patient that she had an appointment scheduled with Dr.McAlhany in 07-2016, but still needed to f/u with Gypsy Balsam, NP in 07-2016 to have her device checked. Patient voiced understanding.  Will forward to Glynda Jaeger for scheduling.

## 2016-07-24 ENCOUNTER — Encounter (HOSPITAL_COMMUNITY): Payer: Self-pay | Admitting: Family Medicine

## 2016-07-24 ENCOUNTER — Ambulatory Visit (HOSPITAL_COMMUNITY)
Admission: EM | Admit: 2016-07-24 | Discharge: 2016-07-24 | Disposition: A | Discharge status: Home / Self Care | Payer: Medicaid Other | Attending: Family Medicine | Admitting: Family Medicine

## 2016-07-24 DIAGNOSIS — Z8674 Personal history of sudden cardiac arrest: Secondary | ICD-10-CM | POA: Insufficient documentation

## 2016-07-24 DIAGNOSIS — Z79899 Other long term (current) drug therapy: Secondary | ICD-10-CM | POA: Diagnosis not present

## 2016-07-24 DIAGNOSIS — I110 Hypertensive heart disease with heart failure: Secondary | ICD-10-CM | POA: Insufficient documentation

## 2016-07-24 DIAGNOSIS — E079 Disorder of thyroid, unspecified: Secondary | ICD-10-CM | POA: Insufficient documentation

## 2016-07-24 DIAGNOSIS — B9689 Other specified bacterial agents as the cause of diseases classified elsewhere: Secondary | ICD-10-CM | POA: Insufficient documentation

## 2016-07-24 DIAGNOSIS — A499 Bacterial infection, unspecified: Secondary | ICD-10-CM | POA: Diagnosis not present

## 2016-07-24 DIAGNOSIS — K219 Gastro-esophageal reflux disease without esophagitis: Secondary | ICD-10-CM | POA: Insufficient documentation

## 2016-07-24 DIAGNOSIS — Z95810 Presence of automatic (implantable) cardiac defibrillator: Secondary | ICD-10-CM | POA: Insufficient documentation

## 2016-07-24 DIAGNOSIS — I428 Other cardiomyopathies: Secondary | ICD-10-CM | POA: Insufficient documentation

## 2016-07-24 DIAGNOSIS — N760 Acute vaginitis: Secondary | ICD-10-CM | POA: Diagnosis not present

## 2016-07-24 DIAGNOSIS — Z8249 Family history of ischemic heart disease and other diseases of the circulatory system: Secondary | ICD-10-CM | POA: Diagnosis not present

## 2016-07-24 DIAGNOSIS — N898 Other specified noninflammatory disorders of vagina: Secondary | ICD-10-CM | POA: Diagnosis present

## 2016-07-24 DIAGNOSIS — Z87891 Personal history of nicotine dependence: Secondary | ICD-10-CM | POA: Insufficient documentation

## 2016-07-24 DIAGNOSIS — I5022 Chronic systolic (congestive) heart failure: Secondary | ICD-10-CM | POA: Diagnosis not present

## 2016-07-24 DIAGNOSIS — Z7982 Long term (current) use of aspirin: Secondary | ICD-10-CM | POA: Insufficient documentation

## 2016-07-24 MED ORDER — METRONIDAZOLE 500 MG PO TABS: 500.0000 mg | ORAL_TABLET | Freq: Two times a day (BID) | ORAL | 0 refills | Status: DC

## 2016-07-24 NOTE — ED Provider Notes (Signed)
CSN: [id number]     Arrival date & time 07/24/16  1018 History   First MD Initiated Contact with Patient 07/24/16 1111     Chief Complaint  Patient presents with  . Vaginal Discharge   (Consider location/radiation/quality/duration/timing/severity/associated sxs/prior Treatment) HPI Vaginal Discharge Patient reports that discharge started 1 week ago.  She notes that discharge appears yellow and runny.  She endorses vaginal odors.  She denies vaginal pruritis, abnormal vaginal bleeding, dysuria, hematuria, pelvic pain, nausea, vomiting, fevers.   She has used Monistat with no relief.   No history of STIs.  She is sexually active and does use condoms.  Contraception: does not use.  No LMP recorded. Patient is not currently having periods (had IUD removed)  Past Medical History:  Diagnosis Date  . Anemia, unspecified   . Atrial fibrillation (HCC)    a. This occurred in setting of resuscitation efforts during VF arrest.  . Automatic implantable cardiac defibrillator in situ    a. 06/2010 s/p  s/p SJM 3231-40 Bi V ICD Ser # [id number].  Marland Kitchen Automatic implantable cardioverter-defibrillator in situ   . Cardiac arrest (HCC)    a. 06/2010  . CHF (congestive heart failure) (HCC)   . Chronic systolic heart failure (HCC)   . Depression    ok  . GERD (gastroesophageal reflux disease)   . History of cocaine abuse    remote  . History of tobacco abuse   . Hypertension   . ICD (implantable cardioverter-defibrillator) lead failure--noise of both leads 6/15 isolated 08/13/2015  . Nonischemic cardiomyopathy (HCC)    a. 06/2010 OOH VF arrest;  b. 06/2010 Cath: nonobs dzs, EF 15-20%, glob HK;  c. 06/2010 Echo: EF 15%, diff HK, Gr 2 DD;  d. 06/2010 Cardiac MRI EF of 29%, no scar;  e. 06/2010 s/p SJM 3231-40 Bi V ICD Ser # [id number].  Marland Kitchen Other left bundle branch block   . Pacemaker   . Paroxysmal supraventricular tachycardia (HCC)   . Shortness of breath    with exertion  . SUBSTANCE ABUSE, MULTIPLE 08/13/2010   Qualifier: Diagnosis of  By: Denyse Amass CMA, Carol    . Thyroid disease    Past Surgical History:  Procedure Laterality Date  . CARDIAC DEFIBRILLATOR PLACEMENT     [address] [place] CD [id number] Q defib, serial number [id number]  . CARDIAC DEFIBRILLATOR PLACEMENT    . OPEN REDUCTION INTERNAL FIXATION (ORIF) DISTAL RADIAL FRACTURE Left 05/07/2014   Procedure: OPEN REDUCTION INTERNAL FIXATION (ORIF) DISTAL RADIAL FRACTURE;  Surgeon: Jodi Marble, MD;  Location: [facility] OR;  Service: Orthopedics;  Laterality: Left;   Family History  Problem Relation Age of Onset  . Heart disease Father   . Hypertension Father   . Hypertension Mother   . Heart disease Mother    Social History  Substance Use Topics  . Smoking status: Former Smoker    Quit date: 07/28/2011  . Smokeless tobacco: Never Used  . Alcohol use No   OB History    Gravida Para Term Preterm AB Living   4 4 4     4    SAB TAB Ectopic Multiple Live Births                 Review of Systems  Constitutional: Negative for chills, fatigue and fever.  Genitourinary: Positive for vaginal discharge. Negative for dyspareunia, dysuria, genital sores, hematuria, vaginal bleeding and vaginal pain.  Skin: Negative for rash.    Allergies  Review of patient's allergies indicates no known  allergies.  Home Medications   Prior to Admission medications   Medication Sig Start Date End Date Taking? Authorizing Provider  albuterol (PROVENTIL HFA;VENTOLIN HFA) 108 (90 BASE) MCG/ACT inhaler Inhale 2 puffs into the lungs every 4 (four) hours as needed. For shortness of breath or wheezing    Historical Provider, MD  aspirin 81 MG chewable tablet Chew 81 mg by mouth daily.    Historical Provider, MD  bacitracin ointment Apply 1 application topically 2 (two) times daily. 09/08/15   Tatyana Kirichenko, PA-C  carvedilol (COREG) 25 MG tablet Take 1 tablet (25 mg total) by mouth 2 (two) times daily with a meal. 08/13/15   Duke SalviaSteven C Klein, MD  diphenhydrAMINE (BENADRYL) 25  MG tablet Take 1 tablet (25 mg total) by mouth every 6 (six) hours as needed for itching (Rash). 05/27/13   Antony MaduraKelly Humes, PA-C  furosemide (LASIX) 40 MG tablet Take 1 tablet (40 mg total) by mouth daily. 10/30/15   Kathleene Hazelhristopher D McAlhany, MD  hydrocortisone cream 1 % Apply to affected area 2 times daily 09/08/15   Tatyana Kirichenko, PA-C  ibuprofen (ADVIL,MOTRIN) 200 MG tablet Take 200 mg by mouth every 6 (six) hours as needed for mild pain.    Historical Provider, MD  isosorbide-hydrALAZINE (BIDIL) 20-37.5 MG per tablet Take 1 tablet by mouth 2 (two) times daily. 11/10/12   Duke SalviaSteven C Klein, MD  lisinopril (PRINIVIL,ZESTRIL) 5 MG tablet take 1 tablet by mouth once daily 04/20/16   Kathleene Hazelhristopher D McAlhany, MD  methocarbamol (ROBAXIN) 500 MG tablet Take 500 mg by mouth 2 (two) times daily as needed for muscle spasms.    Historical Provider, MD  metroNIDAZOLE (FLAGYL) 500 MG tablet Take 1 tablet (500 mg total) by mouth 2 (two) times daily. 07/24/16   Raliegh IpAshly M Durelle Zepeda, DO  Multiple Vitamin (MULTIVITAMIN WITH MINERALS) TABS Take 1 tablet by mouth daily.    Historical Provider, MD  naproxen (NAPROSYN) 500 MG tablet Take 500 mg by mouth 2 (two) times daily as needed (pain).    Historical Provider, MD  ondansetron (ZOFRAN-ODT) 4 MG disintegrating tablet Take 1 tablet (4 mg total) by mouth every 8 (eight) hours as needed for nausea or vomiting. 08/08/14   Ozella Rocksavid J Merrell, MD  polyethylene glycol [facility](MIRALAX / Ethelene HalGLYCOLAX) packet Take 17 g by mouth daily as needed (constipation).  10/28/12   Rolan BuccoMelanie Belfi, MD  potassium chloride SA (K-DUR,KLOR-CON) 20 MEQ tablet Take 1 tablet (20 mEq total) by mouth daily. 11/15/15   Kathleene Hazelhristopher D McAlhany, MD  ranitidine (ZANTAC) 150 MG tablet Take 150 mg by mouth daily as needed for heartburn.    Historical Provider, MD  traMADol (ULTRAM) 50 MG tablet Take 1 tablet (50 mg total) by mouth every 6 (six) hours as needed. FOR PAIN 01/07/14   Linwood DibblesJon Knapp, MD   Meds Ordered and Administered this  Visit  Medications - No data to display  BP 141/81   Pulse 81   Temp 98.2 F (36.8 C)   Resp 18   SpO2 100%  No data found.   Physical Exam  Constitutional: She appears well-developed and well-nourished. No distress.  Eyes: EOM are normal.  Neck: Normal range of motion.  Cardiovascular: Normal rate.   Pulmonary/Chest: Effort normal.  Abdominal: Soft. She exhibits no distension. There is no tenderness.  Genitourinary: Uterus normal. Cervix exhibits no motion tenderness and no friability. Right adnexum displays no mass and no tenderness. Left adnexum displays no mass and no tenderness. No erythema, tenderness or bleeding in  the vagina. Vaginal discharge found.  Genitourinary Comments: Cervical os difficult to see, posterior facing cervix.  Musculoskeletal: Normal range of motion.  Skin: She is not diaphoretic.   Urgent Care Course   Clinical Course   Procedures (including critical care time)  Labs Review Labs Reviewed  CERVICOVAGINAL ANCILLARY ONLY    Imaging Review No results found.   MDM   1. BV (bacterial vaginosis)    Angela Payne is a 55 y.o. female that presents with vaginal discharge.  Clinically appears to have BV, but cannot r/o STIs.  Will send for GC/CT/Trich/Wet prep.  No systemic signs of illness.  Discharged with Flagyl BID x7 days.  Follow up with PCP prn.  Meds ordered this encounter  Medications  . metroNIDAZOLE (FLAGYL) 500 MG tablet    Sig: Take 1 tablet (500 mg total) by mouth 2 (two) times daily.    Dispense:  14 tablet    Refill:  0   This patient was discussed with Barbra Sarks, NP, who agrees with my assessment and plan.  Ciara Kagan M. Nadine Counts, DO PGY-3, [facility]         Raliegh Ip, DO 07/24/16 1134

## 2016-07-24 NOTE — Discharge Instructions (Signed)
You will be contacted if any of your results are positive.  Do not drink alcohol while taking your antibiotic.

## 2016-07-24 NOTE — ED Triage Notes (Signed)
Pt here for vaginal discharge that is yellow, creamy with foul odor.

## 2016-07-27 LAB — CERVICOVAGINAL ANCILLARY ONLY
Chlamydia: NEGATIVE
Neisseria Gonorrhea: NEGATIVE

## 2016-07-28 LAB — CERVICOVAGINAL ANCILLARY ONLY: WET PREP (BD AFFIRM): POSITIVE — AB

## 2016-07-29 ENCOUNTER — Emergency Department (HOSPITAL_COMMUNITY)
Admission: EM | Admit: 2016-07-29 | Discharge: 2016-07-29 | Discharge status: Against Medical Advice | Payer: Medicaid Other

## 2016-07-30 ENCOUNTER — Telehealth (HOSPITAL_COMMUNITY): Payer: Self-pay | Admitting: Emergency Medicine

## 2016-07-30 NOTE — Telephone Encounter (Signed)
-----   Message from Eustace Moore, MD sent at 07/29/2016  4:17 PM EDT ----- Please let patient know that tests for gardnerella (bacterial vaginosis) and trichomonas were positive.  Rx metronidazole was given at [facility] visit 07/24/16.  Recheck or followup with PCP for further evaluation if symptoms persist.  LM

## 2016-07-30 NOTE — Telephone Encounter (Signed)
Called pt and notified of recent lab results from visit 8/25 Pt ID'd properly... Reports feeling better and sx are subsiding Adv pt if sx are not getting better to return or to f/u w/PCP Education on safe sex given Also adv pt to notify partner(s) Pt verb understanding.

## 2016-08-10 NOTE — Progress Notes (Deleted)
Electrophysiology Office Note Date: 08/10/2016  ID:  Angela Payne, DOB [birth year], MRN [id number]  PCP: Burtis Junes, MD (Inactive) Primary Cardiologist: [name] Electrophysiologist: Graciela Husbands  CC: Routine ICD follow-up  Angela Payne is a 55 y.o. female seen today for Dr Graciela Husbands.  She presents today for routine electrophysiology followup.  Since last being seen in our clinic, the patient reports doing very well. She denies chest pain, palpitations, dyspnea, PND, orthopnea, nausea, vomiting, dizziness, syncope, edema, weight gain, or early satiety.  She has not had ICD shocks.   Device History: STJ CRTD implanted 2011 for VF arrest, non ischemic cardiomyopathy History of appropriate therapy: No History of AAD therapy: No   Past Medical History:  Diagnosis Date  . Anemia, unspecified   . Atrial fibrillation (HCC)    a. This occurred in setting of resuscitation efforts during VF arrest.  . Automatic implantable cardiac defibrillator in situ    a. 20-Aug-2010 s/p  s/p SJM 3231-40 Bi V ICD Ser # [id number].  Marland Kitchen Automatic implantable cardioverter-defibrillator in situ   . Cardiac arrest (HCC)    a. 2010-08-20  . CHF (congestive heart failure) (HCC)   . Chronic systolic heart failure (HCC)   . Depression    ok  . GERD (gastroesophageal reflux disease)   . History of cocaine abuse    remote  . History of tobacco abuse   . Hypertension   . ICD (implantable cardioverter-defibrillator) lead failure--noise of both leads 6/15 isolated 08/13/2015  . Nonischemic cardiomyopathy (HCC)    a. Aug 20, 2010 OOH VF arrest;  b. 08-20-10 Cath: nonobs dzs, EF 15-20%, glob HK;  c. 08-20-10 Echo: EF 15%, diff HK, Gr 2 DD;  d. 2010/08/20 Cardiac MRI EF of 29%, no scar;  e. 2010/08/20 s/p SJM 3231-40 Bi V ICD Ser # [id number].  Marland Kitchen Other left bundle branch block   . Pacemaker   . Paroxysmal supraventricular tachycardia (HCC)   . Shortness of breath    with exertion  . SUBSTANCE ABUSE, MULTIPLE 08/13/2010   Qualifier: Diagnosis  of  By: Denyse Amass CMA, Carol    . Thyroid disease    Past Surgical History:  Procedure Laterality Date  . CARDIAC DEFIBRILLATOR PLACEMENT     [address] [place] [id number] Q defib, serial number [id number]  . CARDIAC DEFIBRILLATOR PLACEMENT    . OPEN REDUCTION INTERNAL FIXATION (ORIF) DISTAL RADIAL FRACTURE Left 05/07/2014   Procedure: OPEN REDUCTION INTERNAL FIXATION (ORIF) DISTAL RADIAL FRACTURE;  Surgeon: Jodi Marble, MD;  Location: [facility] OR;  Service: Orthopedics;  Laterality: Left;    Current Outpatient Prescriptions  Medication Sig Dispense Refill  . albuterol (PROVENTIL HFA;VENTOLIN HFA) 108 (90 BASE) MCG/ACT inhaler Inhale 2 puffs into the lungs every 4 (four) hours as needed. For shortness of breath or wheezing    . aspirin 81 MG chewable tablet Chew 81 mg by mouth daily.    . bacitracin ointment Apply 1 application topically 2 (two) times daily. 120 g 0  . carvedilol (COREG) 25 MG tablet Take 1 tablet (25 mg total) by mouth 2 (two) times daily with a meal. 30 tablet 6  . diphenhydrAMINE (BENADRYL) 25 MG tablet Take 1 tablet (25 mg total) by mouth every 6 (six) hours as needed for itching (Rash). 30 tablet 0  . furosemide (LASIX) 40 MG tablet Take 1 tablet (40 mg total) by mouth daily. 30 tablet 11  . hydrocortisone cream 1 % Apply to affected area 2 times daily 15 g 0  . ibuprofen (  ADVIL,MOTRIN) 200 MG tablet Take 200 mg by mouth every 6 (six) hours as needed for mild pain.    . isosorbide-hydrALAZINE (BIDIL) 20-37.5 MG per tablet Take 1 tablet by mouth 2 (two) times daily. 180 tablet 3  . lisinopril (PRINIVIL,ZESTRIL) 5 MG tablet take 1 tablet by mouth once daily 30 tablet 2  . methocarbamol (ROBAXIN) 500 MG tablet Take 500 mg by mouth 2 (two) times daily as needed for muscle spasms.    . metroNIDAZOLE (FLAGYL) 500 MG tablet Take 1 tablet (500 mg total) by mouth 2 (two) times daily. 14 tablet 0  . Multiple Vitamin (MULTIVITAMIN WITH MINERALS) TABS Take 1 tablet by mouth daily.    . naproxen  (NAPROSYN) 500 MG tablet Take 500 mg by mouth 2 (two) times daily as needed (pain).    . ondansetron (ZOFRAN-ODT) 4 MG disintegrating tablet Take 1 tablet (4 mg total) by mouth every 8 (eight) hours as needed for nausea or vomiting. 30 tablet 0  . polyethylene glycol (MIRALAX / GLYCOLAX) packet Take 17 g by mouth daily as needed (constipation).     . potassium chloride SA (K-DUR,KLOR-CON) 20 MEQ tablet Take 1 tablet (20 mEq total) by mouth daily. 30 tablet 9  . ranitidine (ZANTAC) 150 MG tablet Take 150 mg by mouth daily as needed for heartburn.    . traMADol (ULTRAM) 50 MG tablet Take 1 tablet (50 mg total) by mouth every 6 (six) hours as needed. FOR PAIN 30 tablet 0   No current facility-administered medications for this visit.     Allergies:   Review of patient's allergies indicates no known allergies.   Social History: Social History   Social History  . Marital status: Married    Spouse name: N/A  . Number of children: 4  . Years of education: N/A   Occupational History  . Disablity    Social History Main Topics  . Smoking status: Former Smoker    Quit date: 07/28/2011  . Smokeless tobacco: Never Used  . Alcohol use No  . Drug use: No     Comment: history of cocaine abuse last used 2008  . Sexual activity: Yes    Birth control/ protection: IUD   Other Topics Concern  . Not on file   Social History Narrative  . No narrative on file    Family History: Family History  Problem Relation Age of Onset  . Heart disease Father   . Hypertension Father   . Hypertension Mother   . Heart disease Mother     Review of Systems: All other systems reviewed and are otherwise negative except as noted above.   Physical Exam: VS:  There were no vitals taken for this visit. , BMI There is no height or weight on file to calculate BMI.  GEN- The patient is well appearing, alert and oriented x 3 today.   HEENT: normocephalic, atraumatic; sclera clear, conjunctiva pink; hearing  intact; oropharynx clear; neck supple, no JVP Lymph- no cervical lymphadenopathy Lungs- Clear to ausculation bilaterally, normal work of breathing.  No wheezes, rales, rhonchi Heart- Regular rate and rhythm, no murmurs, rubs or gallops, PMI not laterally displaced GI- soft, non-tender, non-distended, bowel sounds present, no hepatosplenomegaly Extremities- no clubbing, cyanosis, or edema; DP/PT/radial pulses 2+ bilaterally MS- no significant deformity or atrophy Skin- warm and dry, no rash or lesion; ICD pocket well healed Psych- euthymic mood, full affect Neuro- strength and sensation are intact  ICD interrogation- reviewed in detail today,  See PACEART  report  EKG:  EKG is ordered today. The ekg ordered today shows ***  Recent Labs: No results found for requested labs within last 8760 hours.   Wt Readings from Last 3 Encounters:  09/08/15 150 lb 5 oz (68.2 kg)  08/13/15 150 lb 3.2 oz (68.1 kg)  07/10/15 151 lb 3.2 oz (68.6 kg)     Other studies Reviewed: Additional studies/ records that were reviewed today include: Dr Odessa Fleming office notes  Assessment and Plan:  1.  Chronic systolic dysfunction euvolemic today Stable on an appropriate medical regimen Normal ICD function See Pace Art report No changes today BMET, BNP today  I have discussed SJM Fortify Assura advisary with the patient today. She understands that recommendation from SJM is to not replace the device at this time. The patient is not device dependant.  The patient was implanted for secondary prevention.  Vibratory alert demonstrated today.  She is actively remotely monitored and understands the importance of compliance today.   2.  VF arrest No recurrent ventricular arrhthymias on device interrogation today  3.  HTN Stable No change required today  4.  Prior cocaine abuse None since 2008   Current medicines are reviewed at length with the patient today.   The patient does not have concerns regarding  her medicines.  The following changes were made today:  ***  Labs/ tests ordered today include: BMET, BNP No orders of the defined types were placed in this encounter.    Disposition:   Follow up with Merlin transmissions, Dr Graciela Husbands 1 year, Dr [name] as scheduled      Signed, Gypsy Balsam, NP 08/10/2016 10:37 AM  [facility] [address] [place] [address] [phone] (office) [phone] (fax

## 2016-08-12 ENCOUNTER — Encounter: Payer: Medicaid Other | Admitting: Nurse Practitioner

## 2016-08-13 ENCOUNTER — Encounter: Payer: Self-pay | Admitting: Nurse Practitioner

## 2016-08-14 ENCOUNTER — Other Ambulatory Visit: Payer: Self-pay | Admitting: Internal Medicine

## 2016-08-24 ENCOUNTER — Ambulatory Visit (INDEPENDENT_AMBULATORY_CARE_PROVIDER_SITE_OTHER): Payer: Medicaid Other | Admitting: Cardiovascular Disease

## 2016-08-24 ENCOUNTER — Ambulatory Visit (INDEPENDENT_AMBULATORY_CARE_PROVIDER_SITE_OTHER): Payer: Medicaid Other | Admitting: *Deleted

## 2016-08-24 ENCOUNTER — Encounter: Payer: Self-pay | Admitting: Cardiovascular Disease

## 2016-08-24 VITALS — BP 130/90 | HR 63 | Ht 63.0 in | Wt 137.0 lb

## 2016-08-24 DIAGNOSIS — I429 Cardiomyopathy, unspecified: Secondary | ICD-10-CM | POA: Diagnosis not present

## 2016-08-24 DIAGNOSIS — I480 Paroxysmal atrial fibrillation: Secondary | ICD-10-CM

## 2016-08-24 DIAGNOSIS — I4901 Ventricular fibrillation: Secondary | ICD-10-CM | POA: Diagnosis not present

## 2016-08-24 DIAGNOSIS — I428 Other cardiomyopathies: Secondary | ICD-10-CM

## 2016-08-24 DIAGNOSIS — I5022 Chronic systolic (congestive) heart failure: Secondary | ICD-10-CM

## 2016-08-24 LAB — CUP PACEART INCLINIC DEVICE CHECK
Battery Remaining Longevity: 15.6
HIGH POWER IMPEDANCE MEASURED VALUE: 41.9286
Implantable Lead Implant Date: 20110819
Implantable Lead Location: 753858
Implantable Lead Location: 753859
Implantable Lead Location: 753860
Lead Channel Impedance Value: 337.5 Ohm
Lead Channel Impedance Value: 475 Ohm
Lead Channel Impedance Value: 825 Ohm
Lead Channel Pacing Threshold Amplitude: 0.5 V
Lead Channel Pacing Threshold Amplitude: 0.75 V
Lead Channel Pacing Threshold Amplitude: 0.75 V
Lead Channel Pacing Threshold Amplitude: 1.75 V
Lead Channel Pacing Threshold Amplitude: 1.75 V
Lead Channel Pacing Threshold Pulse Width: 0.5 ms
Lead Channel Pacing Threshold Pulse Width: 0.5 ms
Lead Channel Pacing Threshold Pulse Width: 0.5 ms
Lead Channel Pacing Threshold Pulse Width: 0.6 ms
Lead Channel Pacing Threshold Pulse Width: 0.6 ms
Lead Channel Sensing Intrinsic Amplitude: 12 mV
Lead Channel Setting Pacing Amplitude: 1.625
Lead Channel Setting Pacing Amplitude: 2.5 V
Lead Channel Setting Pacing Pulse Width: 0.5 ms
Lead Channel Setting Pacing Pulse Width: 0.6 ms
Lead Channel Setting Sensing Sensitivity: 0.5 mV
MDC IDC LEAD IMPLANT DT: 20110819
MDC IDC LEAD IMPLANT DT: 20110819
MDC IDC LEAD MODEL: 7121
MDC IDC MSMT LEADCHNL RA PACING THRESHOLD AMPLITUDE: 0.5 V
MDC IDC MSMT LEADCHNL RA SENSING INTR AMPL: 3.1 mV
MDC IDC MSMT LEADCHNL RV PACING THRESHOLD PULSEWIDTH: 0.5 ms
MDC IDC PG SERIAL: 621486
MDC IDC SESS DTM: 20170925111710
MDC IDC SET LEADCHNL LV PACING AMPLITUDE: 2.5 V
MDC IDC STAT BRADY RA PERCENT PACED: 3.3 %
MDC IDC STAT BRADY RV PERCENT PACED: 96 %

## 2016-08-24 NOTE — Patient Instructions (Signed)
Medication Instructions:  Your physician recommends that you continue on your current medications as directed. Please refer to the Current Medication list given to you today.   Labwork: Your physician recommends that you return for lab work on October 4,2017. The lab opens at 7:30 AM   Testing/Procedures: none  Follow-Up: Your physician wants you to follow-up in: 12 months.  You will receive a reminder letter in the mail two months in advance. If you don't receive a letter, please call our office to schedule the follow-up appointment.   Any Other Special Instructions Will Be Listed Below (If Applicable).     If you need a refill on your cardiac medications before your next appointment, please call your pharmacy.

## 2016-08-24 NOTE — Progress Notes (Signed)
CRT-D device check in office. Thresholds and sensing consistent with previous device measurements. Lead impedance trends stable over time. (4) mode switch episodes recorded (<1%)--max dur. 26min26sec, Max A 190--AT per EGMs. No ventricular arrhythmia episodes recorded. Patient bi-ventricularly pacing 96% of the time. Device programmed with appropriate safety margins. Heart failure diagnostics reviewed and trends are stable for patient. Vibratory alerts demonstrated for patient. No changes made this session. Estimated longevity 1.3 years. Patient will follow up with AS in 3 months. Patient education completed including shock plan.

## 2016-08-24 NOTE — Progress Notes (Signed)
Chief Complaint  Patient presents with  . Follow-up   History of Present Illness: [name] with h/o non-ischemic cardiomyopathy, prior VF arrest August 2011, PAF here today for cardiac follow up. She was admitted to [facility] August 2011 after an out of hospital V. Fib arrest. She was cooled on the Arctic sun protocol and underwent a cardiac cath during the admission in August 2011 which showed no evidence of obstructive CAD. Echo showed EF of 15%. Cardiac MRI in 2011 showed EF of 29%. Her cardiomyopathy was presumed to be non-ischemic, possibly from the history of cocaine abuse. She had an ICD implanted by Dr. Graciela Husbands on July 18, 2010. She has had chronic chest pain since her cardiac arrest. Echo May 2016 with LVEF=35-40%.   She is here for folllow up. No exertional chest pain. Weight is stable. No near syncope, syncope, orthopnea or PND. She has been taking all of her medications. She uses an extra Lasix if needed for LE swelling.   Primary Care Physician:  Formerly [facility].    Past Medical History:  Diagnosis Date  . Anemia, unspecified   . Atrial fibrillation (HCC)    a. This occurred in setting of resuscitation efforts during VF arrest.  . Automatic implantable cardiac defibrillator in situ    a. 06/2010 s/p  s/p SJM 3231-40 Bi V ICD Ser # [id number].  Marland Kitchen Automatic implantable cardioverter-defibrillator in situ   . Cardiac arrest (HCC)    a. 06/2010  . CHF (congestive heart failure) (HCC)   . Chronic systolic heart failure (HCC)   . Depression    ok  . GERD (gastroesophageal reflux disease)   . History of cocaine abuse    remote  . History of tobacco abuse   . Hypertension   . ICD (implantable cardioverter-defibrillator) lead failure--noise of both leads 6/15 isolated 08/13/2015  . Nonischemic cardiomyopathy (HCC)    a. 06/2010 OOH VF arrest;  b. 06/2010 Cath: nonobs dzs, EF 15-20%, glob HK;  c. 06/2010 Echo: EF 15%, diff HK, Gr 2 DD;  d. 06/2010 Cardiac MRI EF of 29%, no  scar;  e. 06/2010 s/p SJM 3231-40 Bi V ICD Ser # [id number].  Marland Kitchen Other left bundle branch block   . Pacemaker   . Paroxysmal supraventricular tachycardia (HCC)   . Shortness of breath    with exertion  . SUBSTANCE ABUSE, MULTIPLE 08/13/2010   Qualifier: Diagnosis of  By: Denyse Amass CMA, Carol    . Thyroid disease     Past Surgical History:  Procedure Laterality Date  . CARDIAC DEFIBRILLATOR PLACEMENT     [address] [place] [id number] Q defib, serial number [id number]  . CARDIAC DEFIBRILLATOR PLACEMENT    . OPEN REDUCTION INTERNAL FIXATION (ORIF) DISTAL RADIAL FRACTURE Left 05/07/2014   Procedure: OPEN REDUCTION INTERNAL FIXATION (ORIF) DISTAL RADIAL FRACTURE;  Surgeon: Jodi Marble, MD;  Location: [facility] OR;  Service: Orthopedics;  Laterality: Left;    Current Outpatient Prescriptions  Medication Sig Dispense Refill  . albuterol (PROVENTIL HFA;VENTOLIN HFA) 108 (90 BASE) MCG/ACT inhaler Inhale 2 puffs into the lungs every 4 (four) hours as needed. For shortness of breath or wheezing    . aspirin 81 MG chewable tablet Chew 81 mg by mouth daily.    . bacitracin ointment Apply 1 application topically 2 (two) times daily. 120 g 0  . carvedilol (COREG) 25 MG tablet take 1 tablet by mouth twice a day with meals 30 tablet 6  . diphenhydrAMINE (BENADRYL) 25 MG  tablet Take 1 tablet (25 mg total) by mouth every 6 (six) hours as needed for itching (Rash). 30 tablet 0  . furosemide (LASIX) 40 MG tablet Take 1 tablet (40 mg total) by mouth daily. 30 tablet 11  . hydrocortisone cream 1 % Apply to affected area 2 times daily 15 g 0  . ibuprofen (ADVIL,MOTRIN) 200 MG tablet Take 200 mg by mouth every 6 (six) hours as needed for mild pain.    . isosorbide-hydrALAZINE (BIDIL) 20-37.5 MG per tablet Take 1 tablet by mouth 2 (two) times daily. 180 tablet 3  . lisinopril (PRINIVIL,ZESTRIL) 5 MG tablet take 1 tablet by mouth once daily 30 tablet 2  . methocarbamol (ROBAXIN) 500 MG tablet Take 500 mg by mouth 2 (two) times daily as  needed for muscle spasms.    . metroNIDAZOLE (FLAGYL) 500 MG tablet Take 1 tablet (500 mg total) by mouth 2 (two) times daily. 14 tablet 0  . Multiple Vitamin (MULTIVITAMIN WITH MINERALS) TABS Take 1 tablet by mouth daily.    . naproxen (NAPROSYN) 500 MG tablet Take 500 mg by mouth 2 (two) times daily as needed (pain).    . ondansetron (ZOFRAN-ODT) 4 MG disintegrating tablet Take 1 tablet (4 mg total) by mouth every 8 (eight) hours as needed for nausea or vomiting. 30 tablet 0  . polyethylene glycol (MIRALAX / GLYCOLAX) packet Take 17 g by mouth daily as needed (constipation).     . potassium chloride SA (K-DUR,KLOR-CON) 20 MEQ tablet Take 1 tablet (20 mEq total) by mouth daily. 30 tablet 9  . ranitidine (ZANTAC) 150 MG tablet Take 150 mg by mouth daily as needed for heartburn.    . traMADol (ULTRAM) 50 MG tablet Take 1 tablet (50 mg total) by mouth every 6 (six) hours as needed. FOR PAIN 30 tablet 0   No current facility-administered medications for this visit.     No Known Allergies  Social History   Social History  . Marital status: Married    Spouse name: N/A  . Number of children: 4  . Years of education: N/A   Occupational History  . Disablity    Social History Main Topics  . Smoking status: Former Smoker    Quit date: 07/28/2011  . Smokeless tobacco: Never Used  . Alcohol use No  . Drug use: No     Comment: history of cocaine abuse last used 2008  . Sexual activity: Yes    Birth control/ protection: IUD   Other Topics Concern  . Not on file   Social History Narrative  . No narrative on file    Family History  Problem Relation Age of Onset  . Heart disease Father   . Hypertension Father   . Hypertension Mother   . Heart disease Mother     Review of Systems:  As stated in the HPI and otherwise negative.   BP 130/90 (BP Location: Left Arm, Patient Position: Sitting, Cuff Size: Normal)   Pulse 63   Ht 5\' 3"  (1.6 m)   Wt 62.1 kg (137 lb)   BMI 24.27 kg/m    Physical Examination: General: Well developed, well nourished, NAD  HEENT: OP clear, mucus membranes moist  SKIN: warm, dry. No rashes. Neuro: No focal deficits  Musculoskeletal: Muscle strength 5/5 all ext  Psychiatric: Mood and affect normal  Neck: No JVD, no carotid bruits, no thyromegaly, no lymphadenopathy.  Lungs:Clear bilaterally, no wheezes, rhonci, crackles Cardiovascular: Regular rate and rhythm. No murmurs, gallops or  rubs. Abdomen:Soft. Bowel sounds present. Non-tender.  Extremities: No lower extremity edema. Pulses are 2 + in the bilateral DP/PT.  Echo May 2016: Left ventricle: The cavity size was moderately dilated. Wall   thickness was normal. Systolic function was moderately reduced.   The estimated ejection fraction was in the range of 35% to 40%.   Diffuse hypokinesis. - Mitral valve: There was mild regurgitation. - Atrial septum: No defect or patent foramen ovale was identified.   EKG:  EKG is ordered today.  The EKG demonstrates sinus, V-paced.  Recent Labs: No results found for requested labs within last 8760 hours.   Lipid Panel    Component Value Date/Time   CHOL 179 11/10/2012 1402   TRIG 97.0 11/10/2012 1402   HDL 34.80 (L) 11/10/2012 1402   CHOLHDL 5 11/10/2012 1402   VLDL 19.4 11/10/2012 1402   LDLCALC 125 (H) 11/10/2012 1402     Wt Readings from Last 3 Encounters:  08/24/16 62.1 kg (137 lb)  09/08/15 68.2 kg (150 lb 5 oz)  08/13/15 68.1 kg (150 lb 3.2 oz)     Other studies Reviewed: Additional studies/ records that were reviewed today include: ED records   Assessment and Plan:   1. Non ischemic cardiomyopathy: Echo May 2016 with LVEF=35-40%. She is on good medical therapy. Blood pressure is well controlled. No changes in therapy today. Will arrange BMET.   2. SYSTOLIC HEART FAILURE, CHRONIC: Volume status is normal today. Weight has been stable. Continue to follow daily weights at home.  Will continue Lasix daily and potassium  supplementation. Dr. Graciela HusbandsKlein follows her ICD.   3. PAF: No recurrence. She is in sinus today  4. History of cardiac arrest: ICD in place. Will check ICD today.   5. 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    Current medicines are reviewed at length with the patient today.  The patient does not have concerns regarding medicines.  The following changes have been made:  no change  Labs/ tests ordered today include: echo  Orders Placed This Encounter  Procedures  . Basic Metabolic Panel (BMET)    Disposition:   FU with me in 12 months  Signed, Verne Carrowhristopher Lisanne Ponce, MD 08/24/2016 1:30 PM    [facility] [address], [place], [place]  [id number] Phone: [phone]; Fax: [phone]

## 2016-08-28 NOTE — Addendum Note (Signed)
Addended by: Dossie Arbour on: 08/28/2016 08:15 AM   Modules accepted: Orders

## 2016-09-02 ENCOUNTER — Other Ambulatory Visit: Payer: Medicaid Other

## 2016-11-11 ENCOUNTER — Encounter: Payer: Self-pay | Admitting: Nurse Practitioner

## 2016-11-18 NOTE — Progress Notes (Deleted)
Electrophysiology Office Note Date: 11/18/2016  ID:  Thresea, Lathrop 1961-08-27, MRN [id number]  PCP: Burtis Junes, MD (Inactive) Primary Cardiologist: [name] Electrophysiologist: Graciela Husbands  CC: Routine ICD follow-up  Orlene A Tempesta is a 55 y.o. female seen today for Dr Graciela Husbands.  He presents today for routine electrophysiology followup.  Since last being seen in our clinic, the patient reports doing very well. He denies chest pain, palpitations, dyspnea, PND, orthopnea, nausea, vomiting, dizziness, syncope, edema, weight gain, or early satiety.  He has not had ICD shocks.   Device History: STJ CRTD implanted 2011 for VF arrest, NICM History of appropriate therapy: No History of AAD therapy: No   Past Medical History:  Diagnosis Date  . Anemia, unspecified   . Atrial fibrillation (HCC)    a. This occurred in setting of resuscitation efforts during VF arrest.  . Automatic implantable cardiac defibrillator in situ    a. Aug 11, 2010 s/p  s/p SJM 3231-40 Bi V ICD Ser # [id number].  Marland Kitchen Automatic implantable cardioverter-defibrillator in situ   . Cardiac arrest (HCC)    a. 2010/08/11  . CHF (congestive heart failure) (HCC)   . Chronic systolic heart failure (HCC)   . Depression    ok  . GERD (gastroesophageal reflux disease)   . History of cocaine abuse    remote  . History of tobacco abuse   . Hypertension   . ICD (implantable cardioverter-defibrillator) lead failure--noise of both leads 6/15 isolated 08/13/2015  . Nonischemic cardiomyopathy (HCC)    a. 11-Aug-2010 OOH VF arrest;  b. 08/11/10 Cath: nonobs dzs, EF 15-20%, glob HK;  c. 08/11/2010 Echo: EF 15%, diff HK, Gr 2 DD;  d. 11-Aug-2010 Cardiac MRI EF of 29%, no scar;  e. 08-11-10 s/p SJM 3231-40 Bi V ICD Ser # [id number].  Marland Kitchen Other left bundle branch block   . Pacemaker   . Paroxysmal supraventricular tachycardia (HCC)   . Shortness of breath    with exertion  . SUBSTANCE ABUSE, MULTIPLE 08/13/2010   Qualifier: Diagnosis of  By: Denyse Amass CMA,  Carol    . Thyroid disease    Past Surgical History:  Procedure Laterality Date  . CARDIAC DEFIBRILLATOR PLACEMENT     [address] [place] [id number] Q defib, serial number [id number]  . CARDIAC DEFIBRILLATOR PLACEMENT    . OPEN REDUCTION INTERNAL FIXATION (ORIF) DISTAL RADIAL FRACTURE Left 05/07/2014   Procedure: OPEN REDUCTION INTERNAL FIXATION (ORIF) DISTAL RADIAL FRACTURE;  Surgeon: Jodi Marble, MD;  Location: [facility] OR;  Service: Orthopedics;  Laterality: Left;    Current Outpatient Prescriptions  Medication Sig Dispense Refill  . albuterol (PROVENTIL HFA;VENTOLIN HFA) 108 (90 BASE) MCG/ACT inhaler Inhale 2 puffs into the lungs every 4 (four) hours as needed. For shortness of breath or wheezing    . aspirin 81 MG chewable tablet Chew 81 mg by mouth daily.    . bacitracin ointment Apply 1 application topically 2 (two) times daily. 120 g 0  . carvedilol (COREG) 25 MG tablet take 1 tablet by mouth twice a day with meals 30 tablet 6  . diphenhydrAMINE (BENADRYL) 25 MG tablet Take 1 tablet (25 mg total) by mouth every 6 (six) hours as needed for itching (Rash). 30 tablet 0  . furosemide (LASIX) 40 MG tablet Take 1 tablet (40 mg total) by mouth daily. 30 tablet 11  . hydrocortisone cream 1 % Apply to affected area 2 times daily 15 g 0  . ibuprofen (ADVIL,MOTRIN) 200 MG tablet Take 200 mg  by mouth every 6 (six) hours as needed for mild pain.    . isosorbide-hydrALAZINE (BIDIL) 20-37.5 MG per tablet Take 1 tablet by mouth 2 (two) times daily. 180 tablet 3  . lisinopril (PRINIVIL,ZESTRIL) 5 MG tablet take 1 tablet by mouth once daily 30 tablet 2  . methocarbamol (ROBAXIN) 500 MG tablet Take 500 mg by mouth 2 (two) times daily as needed for muscle spasms.    . metroNIDAZOLE (FLAGYL) 500 MG tablet Take 1 tablet (500 mg total) by mouth 2 (two) times daily. 14 tablet 0  . Multiple Vitamin (MULTIVITAMIN WITH MINERALS) TABS Take 1 tablet by mouth daily.    . naproxen (NAPROSYN) 500 MG tablet Take 500 mg by mouth  2 (two) times daily as needed (pain).    . ondansetron (ZOFRAN-ODT) 4 MG disintegrating tablet Take 1 tablet (4 mg total) by mouth every 8 (eight) hours as needed for nausea or vomiting. 30 tablet 0  . polyethylene glycol (MIRALAX / GLYCOLAX) packet Take 17 g by mouth daily as needed (constipation).     . potassium chloride SA (K-DUR,KLOR-CON) 20 MEQ tablet Take 1 tablet (20 mEq total) by mouth daily. 30 tablet 9  . ranitidine (ZANTAC) 150 MG tablet Take 150 mg by mouth daily as needed for heartburn.    . traMADol (ULTRAM) 50 MG tablet Take 1 tablet (50 mg total) by mouth every 6 (six) hours as needed. FOR PAIN 30 tablet 0   No current facility-administered medications for this visit.     Allergies:   Patient has no known allergies.   Social History: Social History   Social History  . Marital status: Married    Spouse name: N/A  . Number of children: 4  . Years of education: N/A   Occupational History  . Disablity    Social History Main Topics  . Smoking status: Former Smoker    Quit date: 07/28/2011  . Smokeless tobacco: Never Used  . Alcohol use No  . Drug use: No     Comment: history of cocaine abuse last used 2008  . Sexual activity: Yes    Birth control/ protection: IUD   Other Topics Concern  . Not on file   Social History Narrative  . No narrative on file    Family History: Family History  Problem Relation Age of Onset  . Heart disease Father   . Hypertension Father   . Hypertension Mother   . Heart disease Mother     Review of Systems: All other systems reviewed and are otherwise negative except as noted above.   Physical Exam: VS:  There were no vitals taken for this visit. , BMI There is no height or weight on file to calculate BMI.  GEN- The patient is well appearing, alert and oriented x 3 today.   HEENT: normocephalic, atraumatic; sclera clear, conjunctiva pink; hearing intact; oropharynx clear; neck supple, no JVP Lymph- no cervical  lymphadenopathy Lungs- Clear to ausculation bilaterally, normal work of breathing.  No wheezes, rales, rhonchi Heart- Regular rate and rhythm, no murmurs, rubs or gallops, PMI not laterally displaced GI- soft, non-tender, non-distended, bowel sounds present, no hepatosplenomegaly Extremities- no clubbing, cyanosis, or edema; DP/PT/radial pulses 2+ bilaterally MS- no significant deformity or atrophy Skin- warm and dry, no rash or lesion; ICD pocket well healed Psych- euthymic mood, full affect Neuro- strength and sensation are intact  ICD interrogation- reviewed in detail today,  See PACEART report  EKG:  EKG is not ordered today.  Recent Labs: No results found for requested labs within last 8760 hours.   Wt Readings from Last 3 Encounters:  08/24/16 137 lb (62.1 kg)  09/08/15 150 lb 5 oz (68.2 kg)  08/13/15 150 lb 3.2 oz (68.1 kg)     Other studies Reviewed: Additional studies/ records that were reviewed today include: Dr Graciela Husbands and Dr Gibson Ramp office notes  Assessment and Plan:  1.  Chronic systolic dysfunction euvolemic today Stable on an appropriate medical regimen Normal ICD function See Pace Art report No changes today Will enroll in ICM clinic today   2.  Prior VF arrest No recurrent ventricular arrhythmias on device interrogation today  3.  HTN Stable No change required today  Current medicines are reviewed at length with the patient today.   The patient does not have concerns regarding her medicines.  The following changes were made today:  none  Labs/ tests ordered today include: none No orders of the defined types were placed in this encounter.    Disposition:   Follow up with Arsenio Katz, Dr Graciela Husbands in 1 year, ICM clinic, Dr [name] as scheduled    Signed, Gypsy Balsam, NP 11/18/2016 7:30 AM  [facility] [address] [place] [address] [phone] (office) [phone] (fax

## 2016-11-19 ENCOUNTER — Encounter: Payer: Medicaid Other | Admitting: Nurse Practitioner

## 2016-11-20 ENCOUNTER — Encounter: Payer: Self-pay | Admitting: Nurse Practitioner

## 2016-12-03 ENCOUNTER — Telehealth: Payer: Self-pay | Admitting: Cardiovascular Disease

## 2016-12-03 MED ORDER — FUROSEMIDE 40 MG PO TABS: 40.0000 mg | ORAL_TABLET | Freq: Every day | ORAL | 2 refills | Status: DC

## 2016-12-03 MED ORDER — POTASSIUM CHLORIDE CRYS ER 20 MEQ PO TBCR: 20.0000 meq | EXTENDED_RELEASE_TABLET | Freq: Every day | ORAL | 3 refills | Status: DC

## 2016-12-03 MED ORDER — CARVEDILOL 25 MG PO TABS: 25.0000 mg | ORAL_TABLET | Freq: Two times a day (BID) | ORAL | 2 refills | Status: DC

## 2016-12-03 NOTE — Telephone Encounter (Signed)
Pt calling for refills of Carvedilol 25mg , Lasix 40mg , and Klor-con 20 meq to a new pharmacy and would like this changed in her records to [facility] in [place]

## 2016-12-03 NOTE — Telephone Encounter (Signed)
Pt would like a nurse call and didn't want to say why she was calling as it was personal and she was around a lot of people-pls call  [phone]

## 2016-12-03 NOTE — Telephone Encounter (Signed)
I spoke with Angela Payne. She is calling to set up follow up appt with EP.  Angela Payne in recalls to see Gypsy Balsam, NP.  Appt made for February 7,2018 at 8:20
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
## 2017-01-06 ENCOUNTER — Ambulatory Visit (INDEPENDENT_AMBULATORY_CARE_PROVIDER_SITE_OTHER): Payer: Medicaid Other | Admitting: Nurse Practitioner

## 2017-01-06 ENCOUNTER — Encounter (INDEPENDENT_AMBULATORY_CARE_PROVIDER_SITE_OTHER): Payer: Self-pay

## 2017-01-06 ENCOUNTER — Encounter: Payer: Self-pay | Admitting: Nurse Practitioner

## 2017-01-06 VITALS — BP 128/78 | HR 72 | Ht 63.0 in | Wt 131.0 lb

## 2017-01-06 DIAGNOSIS — I480 Paroxysmal atrial fibrillation: Secondary | ICD-10-CM | POA: Diagnosis not present

## 2017-01-06 DIAGNOSIS — I5022 Chronic systolic (congestive) heart failure: Secondary | ICD-10-CM

## 2017-01-06 DIAGNOSIS — I4901 Ventricular fibrillation: Secondary | ICD-10-CM

## 2017-01-06 LAB — BASIC METABOLIC PANEL
BUN / CREAT RATIO: 13 (ref 9–23)
BUN: 16 mg/dL (ref 6–24)
CALCIUM: 9.4 mg/dL (ref 8.7–10.2)
CHLORIDE: 101 mmol/L (ref 96–106)
CO2: 24 mmol/L (ref 18–29)
Creatinine, Ser: 1.19 mg/dL — ABNORMAL HIGH (ref 0.57–1.00)
GFR calc Af Amer: 60 mL/min/{1.73_m2} (ref >59)
GFR calc non Af Amer: 52 mL/min/{1.73_m2} — ABNORMAL LOW (ref >59)
GLUCOSE: 72 mg/dL (ref 65–99)
Potassium: 3.9 mmol/L (ref 3.5–5.2)
Sodium: 139 mmol/L (ref 134–144)

## 2017-01-06 NOTE — Patient Instructions (Signed)
Medication Instructions:  Your physician recommends that you continue on your current medications as directed. Please refer to the Current Medication list given to you today.   Labwork: Bmet today  Testing/Procedures: None ordered  Follow-Up: Remote monitoring is used to monitor your Pacemaker of ICD from home. This monitoring reduces the number of office visits required to check your device to one time per year. It allows [place] to keep an eye on the functioning of your device to ensure it is working properly. You are scheduled for a device check from home on 04/07/17. You may send your transmission at any time that day. If you have a wireless device, the transmission will be sent automatically. After your physician reviews your transmission, you will receive a postcard with your next transmission date.   Your physician wants you to follow-up in: 1 year with Dr.Klein You will receive a reminder letter in the mail two months in advance. If you don't receive a letter, please call our office to schedule the follow-up appointment.   Any Other Special Instructions Will Be Listed Below (If Applicable).     If you need a refill on your cardiac medications before your next appointment, please call your pharmacy.

## 2017-01-07 LAB — CUP PACEART INCLINIC DEVICE CHECK
Implantable Lead Implant Date: 20110819
Implantable Lead Implant Date: 20110819
Implantable Lead Location: 753858
Implantable Lead Model: 7121
MDC IDC LEAD IMPLANT DT: 20110819
MDC IDC LEAD LOCATION: 753859
MDC IDC LEAD LOCATION: 753860
MDC IDC PG IMPLANT DT: 20110819
MDC IDC PG SERIAL: 621486
MDC IDC SESS DTM: 20180208081752

## 2017-01-18 ENCOUNTER — Telehealth: Payer: Self-pay | Admitting: Cardiology

## 2017-01-18 NOTE — Telephone Encounter (Signed)
LMOVM requesting that pt send manual transmission b/c home monitor has not updated in at least 7 days.    

## 2017-01-25 ENCOUNTER — Telehealth: Payer: Self-pay | Admitting: Cardiology

## 2017-01-25 NOTE — Telephone Encounter (Signed)
Spoke w/ pt and requested that she send a manual transmission b/c her home monitor has not updated in at least 7 days.   

## 2017-02-03 ENCOUNTER — Telehealth: Payer: Self-pay | Admitting: Physician Assistant

## 2017-02-03 NOTE — Telephone Encounter (Signed)
Paged by answering service, patient having intermittent chest tightness after using cocaine about 2 days ago. Associated with SOB. No syncope. Advised to go to ER for further evaluation. Patient was disagreed with plan. Discussed coronary vasospasm and cardiac death. She will think about further evaluation.

## 2017-02-05 ENCOUNTER — Telehealth: Payer: Self-pay | Admitting: Cardiology

## 2017-02-05 NOTE — Telephone Encounter (Signed)
Spoke w/ pt about disconnected monitor. She stated that she doesn't want to use her home monitor b/c it scares her. I informed pt of the importance of having the monitor hooked up and stressed the importance w/ the battery recall on pt ICD. Pt stated that she felt the device vibrate a week or so ago. Offered pt an appt for today or Monday. Pt refused said she was not in the area. Pt stated that she would hook monitor up. Pt agreed to an appt w/ device clinic on 04-07-17 at 9:320 AM b/c she does not want to use her home monitor.

## 2017-02-17 ENCOUNTER — Telehealth: Payer: Self-pay | Admitting: Cardiovascular Disease

## 2017-02-17 NOTE — Telephone Encounter (Signed)
Spoke with patient who adamantly declined using the home monitor. I explained necessity due to advisory warning. Patient verbalized understanding but stated she wanted in clinic checks only. She is currently scheduled for a device clinic check 04/07/2017. I confirmed this appointment with patient.

## 2017-02-17 NOTE — Telephone Encounter (Signed)
New Message  Pt voiced wanting someone to give her a call in regards to her monitor/device.  Please f/u

## 2017-04-15 ENCOUNTER — Ambulatory Visit (INDEPENDENT_AMBULATORY_CARE_PROVIDER_SITE_OTHER): Payer: Medicaid Other | Admitting: *Deleted

## 2017-04-15 DIAGNOSIS — I4901 Ventricular fibrillation: Secondary | ICD-10-CM | POA: Diagnosis not present

## 2017-04-15 LAB — CUP PACEART INCLINIC DEVICE CHECK
HIGH POWER IMPEDANCE MEASURED VALUE: 38.4359
Implantable Lead Implant Date: 20110819
Implantable Lead Implant Date: 20110819
Implantable Lead Location: 753858
Implantable Lead Location: 753860
Lead Channel Impedance Value: 350 Ohm
Lead Channel Impedance Value: 837.5 Ohm
Lead Channel Pacing Threshold Amplitude: 0.5 V
Lead Channel Pacing Threshold Amplitude: 1.25 V
Lead Channel Pacing Threshold Pulse Width: 0.5 ms
Lead Channel Pacing Threshold Pulse Width: 0.5 ms
Lead Channel Sensing Intrinsic Amplitude: 2.4 mV
Lead Channel Setting Pacing Amplitude: 1.5 V
Lead Channel Setting Sensing Sensitivity: 0.5 mV
MDC IDC LEAD IMPLANT DT: 20110819
MDC IDC LEAD LOCATION: 753859
MDC IDC MSMT LEADCHNL LV PACING THRESHOLD AMPLITUDE: 1.25 V
MDC IDC MSMT LEADCHNL LV PACING THRESHOLD PULSEWIDTH: 0.6 ms
MDC IDC MSMT LEADCHNL LV PACING THRESHOLD PULSEWIDTH: 0.6 ms
MDC IDC MSMT LEADCHNL RV IMPEDANCE VALUE: 487.5 Ohm
MDC IDC MSMT LEADCHNL RV PACING THRESHOLD AMPLITUDE: 0.75 V
MDC IDC MSMT LEADCHNL RV SENSING INTR AMPL: 12 mV
MDC IDC PG IMPLANT DT: 20110819
MDC IDC SESS DTM: 20180517105519
MDC IDC SET LEADCHNL LV PACING AMPLITUDE: 2.5 V
MDC IDC SET LEADCHNL LV PACING PULSEWIDTH: 0.6 ms
MDC IDC SET LEADCHNL RV PACING AMPLITUDE: 2.5 V
MDC IDC SET LEADCHNL RV PACING PULSEWIDTH: 0.5 ms
MDC IDC STAT BRADY RA PERCENT PACED: 3.7 %
MDC IDC STAT BRADY RV PERCENT PACED: 97 %
Pulse Gen Serial Number: 621486

## 2017-04-15 NOTE — Progress Notes (Signed)
CRT-D device check in office. Thresholds and sensing consistent with previous device measurements. Lead impedance trends stable over time. 1 mode switch episode recorded (<1% burden)-- ECG appears AT, duration 6 seconds, Peak A rate 183 bpm. No ventricular arrhythmia episodes recorded. Patient bi-ventricularly pacing 97% of the time. Device programmed with appropriate safety margins. Heart failure diagnostics reviewed and trends are stable for patient. Vibratory alerts demonstrated for patient. No changes made this session. Estimated longevity 7.2 months. Patient education completed including shock plan. ROV with SK in 6 months.

## 2017-04-21 ENCOUNTER — Telehealth: Payer: Self-pay | Admitting: Cardiovascular Disease

## 2017-04-21 NOTE — Telephone Encounter (Signed)
New message     Pt wants a call back regarding her health (all she would say)

## 2017-04-21 NOTE — Telephone Encounter (Signed)
I spoke with pt. She is asking if Dr. [name] would make key targeting referral for HUD housing for her.  She states she does not have a primary care physician.  She has no additional information on how to make this referral and who to contact. She will check on this and call us back with more information.

## 2017-06-03 ENCOUNTER — Telehealth: Payer: Self-pay | Admitting: Cardiovascular Disease

## 2017-06-03 NOTE — Telephone Encounter (Signed)
New message   Pt states she requests a call back from a nurse. I asked for further details and she stated that the nurse will know and refused further explanation.

## 2017-06-08 NOTE — Telephone Encounter (Signed)
I spoke with pt who is requesting updated disability letter. This has to be updated every 3 years.  Last written on 08/11/12.  Pt would like letter mailed to her.  Address in chart confirmed with pt. Pt also reports she has been feeling tired lately and is requesting sooner appt than planned appt in October.  She cannot be here until 7/13 due to transportation issues.  I scheduled her to see Dr. [name] on 06/11/17 at 10:40

## 2017-06-10 NOTE — Progress Notes (Signed)
Chief Complaint  Patient presents with  . Fatigue   History of Present Illness: 56 yo female with history of non-ischemic cardiomyopathy, prior VF arrest August 2011, PAF here today for cardiac follow up. She was admitted to [facility] August 2011 after an out of hospital V. Fib arrest. She was cooled on the Arctic sun protocol and underwent a cardiac cath during the admission in August 2011 which showed no evidence of obstructive CAD. Echo showed EF of 15%. Cardiac MRI in 2011 showed EF of 29%. Her cardiomyopathy was presumed to be non-ischemic, possibly from the history of cocaine abuse. She had an ICD implanted by Dr. Graciela Husbands on July 18, 2010. She has had chronic chest pain since her cardiac arrest. Echo May 2016 with LVEF=35-40%.   She is here today for follow up. The patient denies any chest pain, dyspnea, palpitations, orthopnea, PND, dizziness, near syncope or syncope. She has rare LE edema. She seems to have more fatigue lately.    Primary Care Physician: Burtis Junes, MD (Inactive)  Past Medical History:  Diagnosis Date  . Anemia, unspecified   . Atrial fibrillation (HCC)    a. This occurred in setting of resuscitation efforts during VF arrest.  . Automatic implantable cardiac defibrillator in situ    a. 06/2010 s/p  s/p SJM 3231-40 Bi V ICD Ser # [id number].  Marland Kitchen Automatic implantable cardioverter-defibrillator in situ   . Cardiac arrest (HCC)    a. 06/2010  . CHF (congestive heart failure) (HCC)   . Chronic systolic heart failure (HCC)   . Depression    ok  . GERD (gastroesophageal reflux disease)   . History of cocaine abuse    remote  . History of tobacco abuse   . Hypertension   . ICD (implantable cardioverter-defibrillator) lead failure--noise of both leads 6/15 isolated 08/13/2015  . Nonischemic cardiomyopathy (HCC)    a. 06/2010 OOH VF arrest;  b. 06/2010 Cath: nonobs dzs, EF 15-20%, glob HK;  c. 06/2010 Echo: EF 15%, diff HK, Gr 2 DD;  d. 06/2010 Cardiac MRI  EF of 29%, no scar;  e. 06/2010 s/p SJM 3231-40 Bi V ICD Ser # [id number].  Marland Kitchen Other left bundle branch block   . Pacemaker   . Paroxysmal supraventricular tachycardia (HCC)   . Shortness of breath    with exertion  . SUBSTANCE ABUSE, MULTIPLE 08/13/2010   Qualifier: Diagnosis of  By: Denyse Amass CMA, Carol    . Thyroid disease     Past Surgical History:  Procedure Laterality Date  . CARDIAC DEFIBRILLATOR PLACEMENT     [address] [place] CD [id number] Q defib, serial number [id number]  . CARDIAC DEFIBRILLATOR PLACEMENT    . OPEN REDUCTION INTERNAL FIXATION (ORIF) DISTAL RADIAL FRACTURE Left 05/07/2014   Procedure: OPEN REDUCTION INTERNAL FIXATION (ORIF) DISTAL RADIAL FRACTURE;  Surgeon: Jodi Marble, MD;  Location: [facility] OR;  Service: Orthopedics;  Laterality: Left;    Current Outpatient Prescriptions  Medication Sig Dispense Refill  . albuterol (PROVENTIL HFA;VENTOLIN HFA) 108 (90 BASE) MCG/ACT inhaler Inhale 2 puffs into the lungs every 4 (four) hours as needed. For shortness of breath or wheezing    . aspirin 81 MG chewable tablet Chew 81 mg by mouth daily.    . bacitracin ointment Apply 1 application topically 2 (two) times daily. 120 g 0  . carvedilol (COREG) 25 MG tablet Take 1 tablet (25 mg total) by mouth 2 (two) times daily with a meal. 180 tablet 2  . diphenhydrAMINE (  BENADRYL) 25 MG tablet Take 1 tablet (25 mg total) by mouth every 6 (six) hours as needed for itching (Rash). 30 tablet 0  . furosemide (LASIX) 40 MG tablet Take 1 tablet (40 mg total) by mouth daily. 90 tablet 2  . hydrocortisone cream 1 % Apply to affected area 2 times daily 15 g 0  . ibuprofen (ADVIL,MOTRIN) 200 MG tablet Take 200 mg by mouth every 6 (six) hours as needed for mild pain.    . isosorbide-hydrALAZINE (BIDIL) 20-37.5 MG per tablet Take 1 tablet by mouth 2 (two) times daily. 180 tablet 3  . lisinopril (PRINIVIL,ZESTRIL) 5 MG tablet take 1 tablet by mouth once daily 30 tablet 2  . methocarbamol (ROBAXIN) 500 MG tablet Take  500 mg by mouth 2 (two) times daily as needed for muscle spasms.    . metroNIDAZOLE (FLAGYL) 500 MG tablet Take 1 tablet (500 mg total) by mouth 2 (two) times daily. 14 tablet 0  . Multiple Vitamin (MULTIVITAMIN WITH MINERALS) TABS Take 1 tablet by mouth daily.    . naproxen (NAPROSYN) 500 MG tablet Take 500 mg by mouth 2 (two) times daily as needed (pain).    . ondansetron (ZOFRAN-ODT) 4 MG disintegrating tablet Take 1 tablet (4 mg total) by mouth every 8 (eight) hours as needed for nausea or vomiting. 30 tablet 0  . polyethylene glycol (MIRALAX / GLYCOLAX) packet Take 17 g by mouth daily as needed (constipation).     . potassium chloride SA (K-DUR,KLOR-CON) 20 MEQ tablet Take 1 tablet (20 mEq total) by mouth daily. 90 tablet 3  . ranitidine (ZANTAC) 150 MG tablet Take 150 mg by mouth daily as needed for heartburn.    . traMADol (ULTRAM) 50 MG tablet Take 1 tablet (50 mg total) by mouth every 6 (six) hours as needed. FOR PAIN 30 tablet 0   No current facility-administered medications for this visit.     No Known Allergies  Social History   Social History  . Marital status: Married    Spouse name: N/A  . Number of children: 4  . Years of education: N/A   Occupational History  . Disablity    Social History Main Topics  . Smoking status: Former Smoker    Quit date: 07/28/2011  . Smokeless tobacco: Never Used  . Alcohol use No  . Drug use: No     Comment: history of cocaine abuse last used 2008  . Sexual activity: Yes    Birth control/ protection: IUD   Other Topics Concern  . Not on file   Social History Narrative  . No narrative on file    Family History  Problem Relation Age of Onset  . Heart disease Father   . Hypertension Father   . Hypertension Mother   . Heart disease Mother     Review of Systems:  As stated in the HPI and otherwise negative.   BP 126/84   Pulse 60   Ht 5\' 3"  (1.6 m)   Wt 125 lb 6.4 oz (56.9 kg)   SpO2 99%   BMI 22.21 kg/m   Physical  Examination:  General: Well developed, well nourished, NAD  HEENT: OP clear, mucus membranes moist  SKIN: warm, dry. No rashes. Neuro: No focal deficits  Musculoskeletal: Muscle strength 5/5 all ext  Psychiatric: Mood and affect normal  Neck: No JVD, no carotid bruits, no thyromegaly, no lymphadenopathy.  Lungs:Clear bilaterally, no wheezes, rhonci, crackles Cardiovascular: Regular rate and rhythm. No murmurs, gallops or  rubs. Abdomen:Soft. Bowel sounds present. Non-tender.  Extremities: No lower extremity edema. Pulses are 2 + in the bilateral DP/PT.  Echo May 2016: Left ventricle: The cavity size was moderately dilated. Wall   thickness was normal. Systolic function was moderately reduced.   The estimated ejection fraction was in the range of 35% to 40%.   Diffuse hypokinesis. - Mitral valve: There was mild regurgitation. - Atrial septum: No defect or patent foramen ovale was identified.   EKG:  EKG is ordered today.  The EKG demonstrates AV dual paced rhythm  Recent Labs: 01/06/2017: BUN 16; Creatinine, Ser 1.19; Potassium 3.9; Sodium 139   Lipid Panel    Component Value Date/Time   CHOL 179 11/10/2012 1402   TRIG 97.0 11/10/2012 1402   HDL 34.80 (L) 11/10/2012 1402   CHOLHDL 5 11/10/2012 1402   VLDL 19.4 11/10/2012 1402   LDLCALC 125 (H) 11/10/2012 1402     Wt Readings from Last 3 Encounters:  06/11/17 125 lb 6.4 oz (56.9 kg)  01/06/17 131 lb (59.4 kg)  08/24/16 137 lb (62.1 kg)     Other studies Reviewed: Additional studies/ records that were reviewed today include: ED records   Assessment and Plan:   1. Non ischemic cardiomyopathy: She is having more fatigue. Echo in 2016 with LVEF 35-40%. Will continue Coreg and Lisinopril.  ICD in place. Will repeat echo now.   2. Chronic systolic CHF: Volume status is stable. Weight is stable. Continue daily Lasix.    3. Atrial fib, paroxysmal: Sinus today.   4. Substance abuse: In remission  Current medicines are  reviewed at length with the patient today.  The patient does not have concerns regarding medicines.  The following changes have been made:  no change  Labs/ tests ordered today include: echo  Orders Placed This Encounter  Procedures  . EKG 12-Lead  . ECHOCARDIOGRAM COMPLETE    Disposition:   FU with me in 12 months  Signed, Verne Carrow, MD 06/11/2017 11:10 AM    [facility] [address] [place], [place], [place]  [address] Phone: [phone]; Fax: [phone]

## 2017-06-11 ENCOUNTER — Ambulatory Visit (INDEPENDENT_AMBULATORY_CARE_PROVIDER_SITE_OTHER): Payer: Medicaid Other | Admitting: Cardiovascular Disease

## 2017-06-11 ENCOUNTER — Encounter: Payer: Self-pay | Admitting: Cardiovascular Disease

## 2017-06-11 VITALS — BP 126/84 | HR 60 | Ht 63.0 in | Wt 125.4 lb

## 2017-06-11 DIAGNOSIS — I480 Paroxysmal atrial fibrillation: Secondary | ICD-10-CM

## 2017-06-11 DIAGNOSIS — I428 Other cardiomyopathies: Secondary | ICD-10-CM | POA: Diagnosis not present

## 2017-06-11 DIAGNOSIS — I5022 Chronic systolic (congestive) heart failure: Secondary | ICD-10-CM

## 2017-06-11 NOTE — Patient Instructions (Signed)

## 2017-06-11 NOTE — Telephone Encounter (Signed)
New letter given to pt at office visit today

## 2017-06-22 ENCOUNTER — Other Ambulatory Visit (HOSPITAL_COMMUNITY): Payer: Medicaid Other

## 2017-06-23 ENCOUNTER — Telehealth: Payer: Self-pay | Admitting: Cardiovascular Disease

## 2017-06-23 NOTE — Telephone Encounter (Signed)
Patient aware we have not received at fax from [facility]

## 2017-06-23 NOTE — Telephone Encounter (Signed)
Patient calling to see if paperwork was sent to social security.

## 2017-07-01 ENCOUNTER — Telehealth: Payer: Self-pay | Admitting: Cardiovascular Disease

## 2017-07-01 NOTE — Telephone Encounter (Signed)
Called patient back. Emailed her release to [email or web address]  to complete so records could be sent to [facility].

## 2017-07-13 ENCOUNTER — Ambulatory Visit (HOSPITAL_COMMUNITY): Payer: Medicaid Other | Attending: Cardiovascular Disease

## 2017-07-13 ENCOUNTER — Encounter (INDEPENDENT_AMBULATORY_CARE_PROVIDER_SITE_OTHER): Payer: Self-pay

## 2017-07-13 ENCOUNTER — Other Ambulatory Visit: Payer: Self-pay

## 2017-07-13 DIAGNOSIS — I447 Left bundle-branch block, unspecified: Secondary | ICD-10-CM | POA: Diagnosis not present

## 2017-07-13 DIAGNOSIS — Z8249 Family history of ischemic heart disease and other diseases of the circulatory system: Secondary | ICD-10-CM | POA: Insufficient documentation

## 2017-07-13 DIAGNOSIS — I471 Supraventricular tachycardia: Secondary | ICD-10-CM | POA: Diagnosis not present

## 2017-07-13 DIAGNOSIS — I509 Heart failure, unspecified: Secondary | ICD-10-CM | POA: Insufficient documentation

## 2017-07-13 DIAGNOSIS — Z950 Presence of cardiac pacemaker: Secondary | ICD-10-CM | POA: Diagnosis not present

## 2017-07-13 DIAGNOSIS — I110 Hypertensive heart disease with heart failure: Secondary | ICD-10-CM | POA: Insufficient documentation

## 2017-07-13 DIAGNOSIS — Z87891 Personal history of nicotine dependence: Secondary | ICD-10-CM | POA: Diagnosis not present

## 2017-07-13 DIAGNOSIS — I428 Other cardiomyopathies: Secondary | ICD-10-CM | POA: Diagnosis present

## 2017-07-13 DIAGNOSIS — I4891 Unspecified atrial fibrillation: Secondary | ICD-10-CM | POA: Insufficient documentation

## 2017-07-13 DIAGNOSIS — I081 Rheumatic disorders of both mitral and tricuspid valves: Secondary | ICD-10-CM | POA: Diagnosis not present

## 2017-07-14 ENCOUNTER — Telehealth: Payer: Self-pay | Admitting: *Deleted

## 2017-07-14 NOTE — Telephone Encounter (Signed)
Appointment made for pt to come into the device clinic 8/27 to have her device checked.

## 2017-07-14 NOTE — Telephone Encounter (Signed)
I spoke with pt and reviewed echo results with her.  She is asking  when she is due to have her device checked.  Will ask device clinic to contact her.

## 2017-07-27 ENCOUNTER — Encounter: Payer: Self-pay | Admitting: Internal Medicine

## 2017-09-02 ENCOUNTER — Ambulatory Visit: Payer: Medicaid Other | Admitting: Cardiovascular Disease

## 2017-09-17 ENCOUNTER — Telehealth: Payer: Self-pay | Admitting: Cardiovascular Disease

## 2017-09-17 NOTE — Telephone Encounter (Signed)
New message     pt wants to know if she can take 1 a day for women multivitamin?

## 2017-09-17 NOTE — Telephone Encounter (Signed)
Left message to call back  

## 2017-09-20 ENCOUNTER — Ambulatory Visit (INDEPENDENT_AMBULATORY_CARE_PROVIDER_SITE_OTHER): Payer: Medicaid Other | Admitting: *Deleted

## 2017-09-20 VITALS — BP 123/75

## 2017-09-20 DIAGNOSIS — I428 Other cardiomyopathies: Secondary | ICD-10-CM

## 2017-09-20 NOTE — Telephone Encounter (Signed)
I spoke with pt and told her it would be OK to take a multivitamin.

## 2017-09-21 LAB — CUP PACEART INCLINIC DEVICE CHECK
Battery Remaining Longevity: 2 mo
Brady Statistic RA Percent Paced: 6.4 %
HighPow Impedance: 40.1734
Implantable Lead Implant Date: 20110819
Implantable Lead Implant Date: 20110819
Implantable Lead Location: 753858
Implantable Lead Location: 753860
Implantable Lead Model: 7121
Lead Channel Impedance Value: 362.5 Ohm
Lead Channel Pacing Threshold Amplitude: 0.5 V
Lead Channel Pacing Threshold Amplitude: 0.75 V
Lead Channel Pacing Threshold Amplitude: 0.75 V
Lead Channel Pacing Threshold Pulse Width: 0.5 ms
Lead Channel Pacing Threshold Pulse Width: 0.5 ms
Lead Channel Pacing Threshold Pulse Width: 0.6 ms
Lead Channel Sensing Intrinsic Amplitude: 12 mV
Lead Channel Sensing Intrinsic Amplitude: 2.8 mV
Lead Channel Setting Pacing Amplitude: 2.5 V
Lead Channel Setting Pacing Pulse Width: 0.5 ms
MDC IDC LEAD IMPLANT DT: 20110819
MDC IDC LEAD LOCATION: 753859
MDC IDC MSMT LEADCHNL LV IMPEDANCE VALUE: 837.5 Ohm
MDC IDC MSMT LEADCHNL LV PACING THRESHOLD AMPLITUDE: 1 V
MDC IDC MSMT LEADCHNL LV PACING THRESHOLD AMPLITUDE: 1 V
MDC IDC MSMT LEADCHNL LV PACING THRESHOLD PULSEWIDTH: 0.6 ms
MDC IDC MSMT LEADCHNL RA PACING THRESHOLD AMPLITUDE: 0.5 V
MDC IDC MSMT LEADCHNL RA PACING THRESHOLD PULSEWIDTH: 0.5 ms
MDC IDC MSMT LEADCHNL RA PACING THRESHOLD PULSEWIDTH: 0.5 ms
MDC IDC MSMT LEADCHNL RV IMPEDANCE VALUE: 525 Ohm
MDC IDC PG IMPLANT DT: 20110819
MDC IDC PG SERIAL: 621486
MDC IDC SESS DTM: 20181022184740
MDC IDC SET LEADCHNL LV PACING PULSEWIDTH: 0.6 ms
MDC IDC SET LEADCHNL RA PACING AMPLITUDE: 1.5 V
MDC IDC SET LEADCHNL RV PACING AMPLITUDE: 2.5 V
MDC IDC SET LEADCHNL RV SENSING SENSITIVITY: 0.5 mV
MDC IDC STAT BRADY RV PERCENT PACED: 97 %

## 2017-09-21 NOTE — Progress Notes (Signed)
CRT-D device check in office. Thresholds and sensing consistent with previous device measurements. Lead impedance trends stable over time. 4 mode switch episodes recorded - EGMs show atach/sinus tach. No ventricular arrhythmia episodes recorded. Patient bi-ventricularly pacing 97% of the time. Device programmed with appropriate safety margins. Heart failure diagnostics reviewed and trends are stable for patient. Vibratory alerts demonstrated for patient. Estimated longevity <70mo.  ROV in DC 11/19 - battery check only

## 2017-09-24 ENCOUNTER — Telehealth: Payer: Self-pay | Admitting: Cardiology

## 2017-09-24 NOTE — Telephone Encounter (Signed)
Patient called and had questions about her interrogations from 09-21-2017. Call routed to Device [employer].

## 2017-09-24 NOTE — Telephone Encounter (Signed)
Patient called in with questions related to her most recent device interrogation. She stated that she is aware that she is nearing ERI, and that she was concerned about her device "giving out on her". I explained to her that once she reaches ERI she should have 3 months of function. She states that the vibratory alert was demonstrated for her at her appt. I encouraged her to call if the alert is felt. She verbalized understanding. Patient also states that she was told that she had some "events". I told her that based on the documentation from 10/23 the events were where her HR sped up (AT/ST). I told her that Dr.Klein will review the episodes and we will call if anything further is recommended. Patient verbalized understanding.

## 2017-10-25 ENCOUNTER — Telehealth: Payer: Self-pay | Admitting: *Deleted

## 2017-10-25 NOTE — Telephone Encounter (Signed)
Informed patient that her device is at [facility] as of 10/22/17. Patient needs appt with SK or EP APP.   Will forward information to Glynda Jaeger for scheduling.

## 2017-10-26 ENCOUNTER — Telehealth: Payer: Self-pay | Admitting: *Deleted

## 2017-10-26 NOTE — Telephone Encounter (Signed)
LMOVM (DPR) regarding appointment scheduled for this Thursday.  Will advise patient that as ERI as of 10/22/17 has been confirmed by MOD from [facility] ED on 11/24, not necessary to have Device Clinic battery check unless vibratory alert is bothersome.  Scheduler aware that patient needs appointment with Dr. Graciela Husbands or EP APP for next available to discuss gen change.

## 2017-10-27 NOTE — Telephone Encounter (Signed)
Spoke with patient.  Scheduled her for OV with Dr. Graciela Husbands on 11/25/17 at 10:15am to discuss gen change.  Patient reports that the ERI vibratory alert does not bother her, so Device Clinic appointment on 11/29 canceled.  Informed patient that vibratory alert will automatically turn off after 16 days and that if it does bother her in the future, she should call the office.  Patient is agreeable to plan and appointment with Dr. Graciela Husbands. She denies additional questions or concerns at this time.

## 2017-11-16 ENCOUNTER — Telehealth: Payer: Self-pay | Admitting: Internal Medicine

## 2017-11-16 NOTE — Telephone Encounter (Signed)
Called patient back. Patient is not sure what the Department of Social Services needs for help with transportation. Informed patient to call DSS. Provided patient with our fax number incase they need to fax our office anything. Patient verbalized understanding.

## 2017-11-16 NOTE — Telephone Encounter (Signed)
New message  Pt verbalized that she is calling for the rn she needs to have a letter written for transportation to help her get to her appts

## 2017-11-25 ENCOUNTER — Ambulatory Visit (INDEPENDENT_AMBULATORY_CARE_PROVIDER_SITE_OTHER): Payer: Medicaid Other | Admitting: Internal Medicine

## 2017-11-25 VITALS — BP 154/92 | HR 80 | Ht 63.0 in | Wt 134.0 lb

## 2017-11-25 DIAGNOSIS — I428 Other cardiomyopathies: Secondary | ICD-10-CM | POA: Diagnosis not present

## 2017-11-25 DIAGNOSIS — I480 Paroxysmal atrial fibrillation: Secondary | ICD-10-CM

## 2017-11-25 DIAGNOSIS — Z01812 Encounter for preprocedural laboratory examination: Secondary | ICD-10-CM

## 2017-11-25 DIAGNOSIS — Z95810 Presence of automatic (implantable) cardiac defibrillator: Secondary | ICD-10-CM

## 2017-11-25 DIAGNOSIS — I5022 Chronic systolic (congestive) heart failure: Secondary | ICD-10-CM | POA: Diagnosis not present

## 2017-11-25 MED ORDER — LISINOPRIL 10 MG PO TABS: 10.0000 mg | ORAL_TABLET | Freq: Every day | ORAL | 11 refills | Status: DC

## 2017-11-25 NOTE — Progress Notes (Signed)
Patient Care Team: Bruna PotterBlount, Viviana SimplerAlvin Vincent, MD (Inactive) as PCP - General (Family Medicine) Mack Hookhompson, David, MD as Consulting Physician (Orthopedic Surgery)   HPI  Angela Payne is a 56 y.o. female With a chief complaint of aborted cardiac arrest status post CRT-D implantation in the context of nonischemic cardiomyopathy and no asked the question as to whether it was related to cocaine.  she was last seen by EP in 2016.  DATE TEST    8/11 cMRI   EF 29 % No DGE  5/16 Echo    EF 35-40 %   8/18 Echo  EF 40-45%    Denies chest pain but some fatigue and DOE  With PND  But no edema  BP at home 120-160  Date Cr K  2/18 1.19 3.9          Records and Results Reviewed   Past Medical History:  Diagnosis Date  . Anemia, unspecified   . Atrial fibrillation (HCC)    a. This occurred in setting of resuscitation efforts during VF arrest.  . Automatic implantable cardiac defibrillator in situ    a. 06/2010 s/p  s/p SJM 3231-40 Bi V ICD Ser # [id number].  Marland Kitchen. Automatic implantable cardioverter-defibrillator in situ   . Cardiac arrest (HCC)    a. 06/2010  . CHF (congestive heart failure) (HCC)   . Chronic systolic heart failure (HCC)   . Depression    ok  . GERD (gastroesophageal reflux disease)   . History of cocaine abuse    remote  . History of tobacco abuse   . Hypertension   . ICD (implantable cardioverter-defibrillator) lead failure--noise of both leads 6/15 isolated 08/13/2015  . Nonischemic cardiomyopathy (HCC)    a. 06/2010 OOH VF arrest;  b. 06/2010 Cath: nonobs dzs, EF 15-20%, glob HK;  c. 06/2010 Echo: EF 15%, diff HK, Gr 2 DD;  d. 06/2010 Cardiac MRI EF of 29%, no scar;  e. 06/2010 s/p SJM 3231-40 Bi V ICD Ser # [id number].  Marland Kitchen. Other left bundle branch block   . Pacemaker   . Paroxysmal supraventricular tachycardia (HCC)   . Shortness of breath    with exertion  . SUBSTANCE ABUSE, MULTIPLE 08/13/2010   Qualifier: Diagnosis of  By: Denyse AmassFiato, CMA, Carol    . Thyroid disease      Past Surgical History:  Procedure Laterality Date  . CARDIAC DEFIBRILLATOR PLACEMENT     [address] [place] [id number] Q defib, serial number [id number]  . CARDIAC DEFIBRILLATOR PLACEMENT    . OPEN REDUCTION INTERNAL FIXATION (ORIF) DISTAL RADIAL FRACTURE Left 05/07/2014   Procedure: OPEN REDUCTION INTERNAL FIXATION (ORIF) DISTAL RADIAL FRACTURE;  Surgeon: Jodi Marbleavid A Thompson, MD;  Location: [facility] OR;  Service: Orthopedics;  Laterality: Left;    Current Outpatient Medications  Medication Sig Dispense Refill  . albuterol (PROVENTIL HFA;VENTOLIN HFA) 108 (90 BASE) MCG/ACT inhaler Inhale 2 puffs into the lungs every 4 (four) hours as needed. For shortness of breath or wheezing    . aspirin 81 MG chewable tablet Chew 81 mg by mouth daily.    . bacitracin ointment Apply 1 application topically 2 (two) times daily. 120 g 0  . carvedilol (COREG) 25 MG tablet Take 1 tablet (25 mg total) by mouth 2 (two) times daily with a meal. 180 tablet 2  . diphenhydrAMINE (BENADRYL) 25 MG tablet Take 1 tablet (25 mg total) by mouth every 6 (six) hours as needed for itching (Rash). 30 tablet 0  .  furosemide (LASIX) 40 MG tablet Take 1 tablet (40 mg total) by mouth daily. 90 tablet 2  . hydrocortisone cream 1 % Apply to affected area 2 times daily 15 g 0  . ibuprofen (ADVIL,MOTRIN) 200 MG tablet Take 200 mg by mouth every 6 (six) hours as needed for mild pain.    . isosorbide-hydrALAZINE (BIDIL) 20-37.5 MG per tablet Take 1 tablet by mouth 2 (two) times daily. 180 tablet 3  . lisinopril (PRINIVIL,ZESTRIL) 5 MG tablet take 1 tablet by mouth once daily 30 tablet 2  . methocarbamol (ROBAXIN) 500 MG tablet Take 500 mg by mouth 2 (two) times daily as needed for muscle spasms.    . metroNIDAZOLE (FLAGYL) 500 MG tablet Take 1 tablet (500 mg total) by mouth 2 (two) times daily. 14 tablet 0  . Multiple Vitamin (MULTIVITAMIN WITH MINERALS) TABS Take 1 tablet by mouth daily.    . naproxen (NAPROSYN) 500 MG tablet Take 500 mg by mouth 2  (two) times daily as needed (pain).    . ondansetron (ZOFRAN-ODT) 4 MG disintegrating tablet Take 1 tablet (4 mg total) by mouth every 8 (eight) hours as needed for nausea or vomiting. 30 tablet 0  . polyethylene glycol (MIRALAX / GLYCOLAX) packet Take 17 g by mouth daily as needed (constipation).     . potassium chloride SA (K-DUR,KLOR-CON) 20 MEQ tablet Take 1 tablet (20 mEq total) by mouth daily. 90 tablet 3  . ranitidine (ZANTAC) 150 MG tablet Take 150 mg by mouth daily as needed for heartburn.    . traMADol (ULTRAM) 50 MG tablet Take 1 tablet (50 mg total) by mouth every 6 (six) hours as needed. FOR PAIN 30 tablet 0   No current facility-administered medications for this visit.     No Known Allergies    Review of Systems negative except from HPI and PMH  Physical Exam BP (!) 154/92   Pulse 80   Ht 5\' 3"  (1.6 m)   Wt 134 lb (60.8 kg)   SpO2 97%   BMI 23.74 kg/m  Well developed and nourished in no acute distress HENT normal Neck supple with JVP-flat Clear Regular rate and rhythm, no murmurs or gallops Abd-soft with active BS No Clubbing cyanosis edema Skin-warm and dry A & Oriented  Grossly normal sensory and motor function  ECG to assess resynchronization AV pacing 80 14/13/48    Assessment and  Plan  Aborted cardiac Arrest  NICM  CRT-D [employer] Jude   Hypertension  CHF chronic systolic   No interval arrhythmias.  Her device has reached ERI.  We have discussed the risks and benefits of ICD generator replacement in the context of appropriate use criteria.  We have discussed the issues of infection.  She is agreeable to proceeding.  We have reviewed the benefits and risks of generator replacement.  These include but are not limited to lead fracture and infection.  The patient understands, agrees  With shared decision making is willing to proceed.     Her blood pressure is elevated.  We discussed Entresto in the context of her cardiomyopathy as well as  Spironolactone.  For now, we will increase her lisinopril from 5--10.  She is a little bit reluctant to use Entresto given its newness.  Blood pressure is inadequately controlled with lisinopril, add spironolactone.  More than 50% of 40  min was spent in counseling related to the above    Current medicines are reviewed at length with the patient today .  The  patient does not  have concerns regarding medicines.

## 2017-11-25 NOTE — Patient Instructions (Addendum)
Medication Instructions: Your physician has recommended you make the following change in your medication:  -1) INCREASE Lisinopril 10 mg - Take 1 tablet (10 mg) by mouth daily  Labwork: Your physician recommends that you return for lab work on 12/22/17 for BMET and CBC  Procedures/Testing: Your physician has recommended that you have a defibrillator battery changed (gen change). An implantable cardioverter defibrillator (ICD) is a small device that is placed in your chest or, in rare cases, your abdomen. This device uses electrical pulses or shocks to help control life-threatening, irregular heartbeats that could lead the heart to suddenly stop beating (sudden cardiac arrest). Leads are attached to the ICD that goes into your heart. This is done in the hospital and usually requires an overnight stay. Please see the instruction sheet given to you today for more information   Follow-Up: Your physician recommends that you schedule a follow-up appointment in 10 - 14 days from 12/29/17 with the Device Clinic for a Wound Check  Your physician recommends that you schedule a follow-up appointment in 3 MONTHS from 12/29/17 with Dr. Graciela Husbands  If you need a refill on your cardiac medications before your next appointment, please call your pharmacy.

## 2017-12-22 ENCOUNTER — Other Ambulatory Visit: Payer: Medicaid Other

## 2017-12-23 ENCOUNTER — Other Ambulatory Visit: Payer: Medicaid Other

## 2017-12-23 DIAGNOSIS — Z01812 Encounter for preprocedural laboratory examination: Secondary | ICD-10-CM

## 2017-12-23 DIAGNOSIS — I428 Other cardiomyopathies: Secondary | ICD-10-CM

## 2017-12-23 DIAGNOSIS — I5022 Chronic systolic (congestive) heart failure: Secondary | ICD-10-CM

## 2017-12-23 DIAGNOSIS — I480 Paroxysmal atrial fibrillation: Secondary | ICD-10-CM

## 2017-12-23 DIAGNOSIS — Z95810 Presence of automatic (implantable) cardiac defibrillator: Secondary | ICD-10-CM

## 2017-12-24 LAB — CBC WITH DIFFERENTIAL/PLATELET
BASOS ABS: 0 10*3/uL (ref 0.0–0.2)
Basos: 0 %
EOS (ABSOLUTE): 0.2 10*3/uL (ref 0.0–0.4)
Eos: 3 %
Hematocrit: 39.9 % (ref 34.0–46.6)
Hemoglobin: 12.6 g/dL (ref 11.1–15.9)
Immature Grans (Abs): 0 10*3/uL (ref 0.0–0.1)
Immature Granulocytes: 0 %
Lymphocytes Absolute: 2 10*3/uL (ref 0.7–3.1)
Lymphs: 35 %
MCH: 26.5 pg — AB (ref 26.6–33.0)
MCHC: 31.6 g/dL (ref 31.5–35.7)
MCV: 84 fL (ref 79–97)
MONOS ABS: 0.4 10*3/uL (ref 0.1–0.9)
Monocytes: 6 %
NEUTROS PCT: 56 %
Neutrophils Absolute: 3.3 10*3/uL (ref 1.4–7.0)
Platelets: 231 10*3/uL (ref 150–379)
RBC: 4.76 x10E6/uL (ref 3.77–5.28)
RDW: 15.5 % — AB (ref 12.3–15.4)
WBC: 5.9 10*3/uL (ref 3.4–10.8)

## 2017-12-24 LAB — CUP PACEART INCLINIC DEVICE CHECK
HighPow Impedance: 39.1453
Implantable Lead Implant Date: 20110819
Implantable Lead Location: 753858
Implantable Lead Model: 7121
Implantable Pulse Generator Implant Date: 20110819
Lead Channel Pacing Threshold Amplitude: 0.75 V
Lead Channel Pacing Threshold Pulse Width: 0.5 ms
Lead Channel Pacing Threshold Pulse Width: 0.6 ms
Lead Channel Sensing Intrinsic Amplitude: 12 mV
Lead Channel Sensing Intrinsic Amplitude: 2.6 mV
Lead Channel Setting Pacing Amplitude: 2.5 V
Lead Channel Setting Pacing Pulse Width: 0.5 ms
Lead Channel Setting Pacing Pulse Width: 0.6 ms
MDC IDC LEAD IMPLANT DT: 20110819
MDC IDC LEAD IMPLANT DT: 20110819
MDC IDC LEAD LOCATION: 753859
MDC IDC LEAD LOCATION: 753860
MDC IDC MSMT BATTERY REMAINING LONGEVITY: 0 mo
MDC IDC MSMT LEADCHNL LV IMPEDANCE VALUE: 787.5 Ohm
MDC IDC MSMT LEADCHNL LV PACING THRESHOLD AMPLITUDE: 1 V
MDC IDC MSMT LEADCHNL RA IMPEDANCE VALUE: 337.5 Ohm
MDC IDC MSMT LEADCHNL RA PACING THRESHOLD AMPLITUDE: 0.5 V
MDC IDC MSMT LEADCHNL RA PACING THRESHOLD PULSEWIDTH: 0.5 ms
MDC IDC MSMT LEADCHNL RV IMPEDANCE VALUE: 462.5 Ohm
MDC IDC PG SERIAL: 621486
MDC IDC SESS DTM: 20181227154836
MDC IDC SET LEADCHNL RA PACING AMPLITUDE: 1.5 V
MDC IDC SET LEADCHNL RV PACING AMPLITUDE: 2.5 V
MDC IDC SET LEADCHNL RV SENSING SENSITIVITY: 0.5 mV
MDC IDC STAT BRADY RA PERCENT PACED: 6.9 %
MDC IDC STAT BRADY RV PERCENT PACED: 97 %

## 2017-12-24 LAB — BASIC METABOLIC PANEL
BUN / CREAT RATIO: 20 (ref 9–23)
BUN: 23 mg/dL (ref 6–24)
CHLORIDE: 108 mmol/L — AB (ref 96–106)
CO2: 22 mmol/L (ref 20–29)
Calcium: 8.7 mg/dL (ref 8.7–10.2)
Creatinine, Ser: 1.15 mg/dL — ABNORMAL HIGH (ref 0.57–1.00)
GFR calc Af Amer: 62 mL/min/{1.73_m2} (ref >59)
GFR calc non Af Amer: 54 mL/min/{1.73_m2} — ABNORMAL LOW (ref >59)
GLUCOSE: 93 mg/dL (ref 65–99)
POTASSIUM: 4.1 mmol/L (ref 3.5–5.2)
SODIUM: 143 mmol/L (ref 134–144)

## 2017-12-28 ENCOUNTER — Telehealth: Payer: Self-pay | Admitting: Internal Medicine

## 2017-12-28 NOTE — Telephone Encounter (Signed)
Pt was able to find a ride for her change out tomorrow. She is planning on being there.

## 2017-12-28 NOTE — Telephone Encounter (Signed)
New message   Pt would like to reschedule her hospital admissions on 1/30 because daughter is sick and she doesn't want to come by herself Please call

## 2017-12-29 ENCOUNTER — Ambulatory Visit (HOSPITAL_COMMUNITY)
Admission: RE | Admit: 2017-12-29 | Discharge: 2017-12-29 | Disposition: A | Discharge status: Home / Self Care | Payer: Medicaid Other | Source: Ambulatory Visit | Attending: Internal Medicine | Admitting: Internal Medicine

## 2017-12-29 ENCOUNTER — Ambulatory Visit (HOSPITAL_COMMUNITY)
Admission: RE | Disposition: A | Discharge status: Home / Self Care | Payer: Self-pay | Source: Ambulatory Visit | Attending: Internal Medicine

## 2017-12-29 DIAGNOSIS — Z87891 Personal history of nicotine dependence: Secondary | ICD-10-CM | POA: Insufficient documentation

## 2017-12-29 DIAGNOSIS — Z7982 Long term (current) use of aspirin: Secondary | ICD-10-CM | POA: Diagnosis not present

## 2017-12-29 DIAGNOSIS — I429 Cardiomyopathy, unspecified: Secondary | ICD-10-CM | POA: Insufficient documentation

## 2017-12-29 DIAGNOSIS — Z8674 Personal history of sudden cardiac arrest: Secondary | ICD-10-CM | POA: Insufficient documentation

## 2017-12-29 DIAGNOSIS — I428 Other cardiomyopathies: Secondary | ICD-10-CM

## 2017-12-29 DIAGNOSIS — I110 Hypertensive heart disease with heart failure: Secondary | ICD-10-CM | POA: Insufficient documentation

## 2017-12-29 DIAGNOSIS — Z4502 Encounter for adjustment and management of automatic implantable cardiac defibrillator: Secondary | ICD-10-CM | POA: Insufficient documentation

## 2017-12-29 DIAGNOSIS — T82110A Breakdown (mechanical) of cardiac electrode, initial encounter: Secondary | ICD-10-CM | POA: Diagnosis present

## 2017-12-29 DIAGNOSIS — Z79899 Other long term (current) drug therapy: Secondary | ICD-10-CM | POA: Diagnosis not present

## 2017-12-29 DIAGNOSIS — I4901 Ventricular fibrillation: Secondary | ICD-10-CM | POA: Diagnosis present

## 2017-12-29 DIAGNOSIS — I5022 Chronic systolic (congestive) heart failure: Secondary | ICD-10-CM | POA: Diagnosis not present

## 2017-12-29 HISTORY — PX: BIV ICD GENERATOR CHANGEOUT: EP1194

## 2017-12-29 LAB — SURGICAL PCR SCREEN
MRSA, PCR: NEGATIVE
STAPHYLOCOCCUS AUREUS: NEGATIVE

## 2017-12-29 SURGERY — BIV ICD GENERATOR CHANGEOUT

## 2017-12-29 MED ORDER — LIDOCAINE HCL (PF) 1 % IJ SOLN
INTRAMUSCULAR | Status: AC
  Filled 2017-12-29: qty 60

## 2017-12-29 MED ORDER — CEFAZOLIN SODIUM-DEXTROSE 2-4 GM/100ML-% IV SOLN
2.0000 g | INTRAVENOUS | Status: AC
  Administered 2017-12-29: 2 g via INTRAVENOUS

## 2017-12-29 MED ORDER — MIDAZOLAM HCL 5 MG/5ML IJ SOLN
INTRAMUSCULAR | Status: AC
  Filled 2017-12-29: qty 5

## 2017-12-29 MED ORDER — SODIUM CHLORIDE 0.9 % IR SOLN
Status: AC
  Filled 2017-12-29: qty 2

## 2017-12-29 MED ORDER — FENTANYL CITRATE (PF) 100 MCG/2ML IJ SOLN
INTRAMUSCULAR | Status: DC | PRN
  Administered 2017-12-29: 50 ug via INTRAVENOUS

## 2017-12-29 MED ORDER — SODIUM CHLORIDE 0.9 % IV SOLN: INTRAVENOUS | Status: DC

## 2017-12-29 MED ORDER — MUPIROCIN 2 % EX OINT
TOPICAL_OINTMENT | CUTANEOUS | Status: AC
  Filled 2017-12-29: qty 22

## 2017-12-29 MED ORDER — FENTANYL CITRATE (PF) 100 MCG/2ML IJ SOLN
INTRAMUSCULAR | Status: AC
  Filled 2017-12-29: qty 2

## 2017-12-29 MED ORDER — LIDOCAINE HCL (PF) 1 % IJ SOLN
INTRAMUSCULAR | Status: DC | PRN
  Administered 2017-12-29: 60 mL

## 2017-12-29 MED ORDER — CEFAZOLIN SODIUM-DEXTROSE 2-4 GM/100ML-% IV SOLN
INTRAVENOUS | Status: AC
  Filled 2017-12-29: qty 100

## 2017-12-29 MED ORDER — SODIUM CHLORIDE 0.9 % IV SOLN
INTRAVENOUS | Status: DC
  Administered 2017-12-29: 10:00:00 via INTRAVENOUS

## 2017-12-29 MED ORDER — ACETAMINOPHEN 325 MG PO TABS: 325.0000 mg | ORAL_TABLET | ORAL | Status: DC | PRN

## 2017-12-29 MED ORDER — MIDAZOLAM HCL 5 MG/5ML IJ SOLN
INTRAMUSCULAR | Status: DC | PRN
  Administered 2017-12-29: 1 mg via INTRAVENOUS

## 2017-12-29 MED ORDER — SODIUM CHLORIDE 0.9 % IR SOLN
80.0000 mg | Status: AC
  Administered 2017-12-29: 80 mg

## 2017-12-29 MED ORDER — ONDANSETRON HCL 4 MG/2ML IJ SOLN: 4.0000 mg | Freq: Four times a day (QID) | INTRAMUSCULAR | Status: DC | PRN

## 2017-12-29 MED ORDER — CHLORHEXIDINE GLUCONATE 4 % EX LIQD: 60.0000 mL | Freq: Once | CUTANEOUS | Status: DC

## 2017-12-29 SURGICAL SUPPLY — 5 items
CABLE SURGICAL S-101-97-12 (CABLE) ×2 IMPLANT
HEMOSTAT SURGICEL 2X4 FIBR (HEMOSTASIS) ×2 IMPLANT
ICD UNIFY ASUR CRT CD3357-40Q (ICD Generator) ×2 IMPLANT
PAD DEFIB LIFELINK (PAD) ×2 IMPLANT
TRAY PACEMAKER INSERTION (PACKS) ×2 IMPLANT

## 2017-12-29 NOTE — H&P (Signed)
Patient Care Team: Burtis Junes, MD (Inactive) as PCP - General (Family Medicine) Mack Hook, MD as Consulting Physician (Orthopedic Surgery)   HPI  Angela Payne is a 57 y.o. female Admitted for CRT-D generator replacement.    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     DATE TEST    8/11 cMRI   EF 29 % No DGE  5/16 Echo    EF 35-40 %   8/18 Echo  EF 40-45%     No chest pain or sob, fevers or chills  Records and Results Reviewed   Past Medical History:  Diagnosis Date  . Anemia, unspecified   . Atrial fibrillation (HCC)    a. This occurred in setting of resuscitation efforts during VF arrest.  . Automatic implantable cardiac defibrillator in situ    a. Jul 28, 2010 s/p  s/p SJM 3231-40 Bi V ICD Ser # [id number].  Marland Kitchen Automatic implantable cardioverter-defibrillator in situ   . Cardiac arrest (HCC)    a. 28-Jul-2010  . CHF (congestive heart failure) (HCC)   . Chronic systolic heart failure (HCC)   . Depression    ok  . GERD (gastroesophageal reflux disease)   . History of cocaine abuse    remote  . History of tobacco abuse   . Hypertension   . ICD (implantable cardioverter-defibrillator) lead failure--noise of both leads 6/15 isolated 08/13/2015  . Nonischemic cardiomyopathy (HCC)    a. July 28, 2010 OOH VF arrest;  b. 2010/07/28 Cath: nonobs dzs, EF 15-20%, glob HK;  c. Jul 28, 2010 Echo: EF 15%, diff HK, Gr 2 DD;  d. 07-28-10 Cardiac MRI EF of 29%, no scar;  e. 07-28-10 s/p SJM 3231-40 Bi V ICD Ser # [id number].  Marland Kitchen Other left bundle branch block   . Pacemaker   . Paroxysmal supraventricular tachycardia (HCC)   . Shortness of breath    with exertion  . SUBSTANCE ABUSE, MULTIPLE 08/13/2010   Qualifier: Diagnosis of  By: Denyse Amass CMA, Carol    . Thyroid disease     Past Surgical History:  Procedure Laterality Date  . CARDIAC DEFIBRILLATOR PLACEMENT      [address] [place] [id number] Q defib, serial number [id number]  . CARDIAC DEFIBRILLATOR PLACEMENT    . OPEN REDUCTION INTERNAL FIXATION (ORIF) DISTAL RADIAL FRACTURE Left 05/07/2014   Procedure: OPEN REDUCTION INTERNAL FIXATION (ORIF) DISTAL RADIAL FRACTURE;  Surgeon: Jodi Marble, MD;  Location: [facility];  Service: Orthopedics;  Laterality: Left;    No current facility-administered medications for this encounter.    Current Outpatient Medications  Medication Sig Dispense Refill  . albuterol (PROVENTIL HFA;VENTOLIN HFA) 108 (90 BASE) MCG/ACT inhaler Inhale 2 puffs into the lungs every 4 (four) hours as needed for wheezing or shortness of breath.     Marland Kitchen aspirin 81 MG chewable tablet Chew 81 mg by mouth daily.    . bacitracin ointment Apply 1 application topically 2 (two) times daily. (Patient taking differently: Apply 1 application topically 2 (two) times daily as needed for wound care. ) 120 g 0  . carvedilol (COREG) 25 MG tablet Take 1 tablet (25 mg total) by mouth 2 (two) times daily with a meal. 180 tablet 2  . diphenhydrAMINE (BENADRYL) 25 MG tablet Take 1 tablet (25 mg total) by mouth every 6 (six) hours as needed for itching (Rash). 30 tablet 0  .  furosemide (LASIX) 40 MG tablet Take 1 tablet (40 mg total) by mouth daily. 90 tablet 2  . hydrocortisone cream 1 % Apply to affected area 2 times daily (Patient taking differently: Apply 1 application topically 2 (two) times daily as needed for itching. ) 15 g 0  . ibuprofen (ADVIL,MOTRIN) 200 MG tablet Take 200-400 mg by mouth every 6 (six) hours as needed for headache or mild pain.     Marland Kitchen lisinopril (PRINIVIL,ZESTRIL) 10 MG tablet Take 1 tablet (10 mg total) by mouth daily. 30 tablet 11  . methocarbamol (ROBAXIN) 500 MG tablet Take 500 mg by mouth daily as needed for muscle spasms.    . Multiple Vitamin (MULTIVITAMIN WITH MINERALS) TABS Take 1 tablet by mouth daily.    . naproxen (NAPROSYN) 500 MG tablet Take 500 mg by mouth 2 (two) times daily as  needed for moderate pain.     . potassium chloride SA (K-DUR,KLOR-CON) 20 MEQ tablet Take 1 tablet (20 mEq total) by mouth daily. 90 tablet 3  . traMADol (ULTRAM) 50 MG tablet Take 1 tablet (50 mg total) by mouth every 6 (six) hours as needed. FOR PAIN (Patient taking differently: Take 50 mg by mouth every 6 (six) hours as needed for moderate pain. ) 30 tablet 0  . isosorbide-hydrALAZINE (BIDIL) 20-37.5 MG per tablet Take 1 tablet by mouth 2 (two) times daily. (Patient not taking: Reported on 12/17/2017) 180 tablet 3  . ondansetron (ZOFRAN-ODT) 4 MG disintegrating tablet Take 1 tablet (4 mg total) by mouth every 8 (eight) hours as needed for nausea or vomiting. (Patient not taking: Reported on 12/17/2017) 30 tablet 0    No Known Allergies    Social History   Tobacco Use  . Smoking status: Former Smoker    Last attempt to quit: 07/28/2011    Years since quitting: 6.4  . Smokeless tobacco: Never Used  Substance Use Topics  . Alcohol use: No  . Drug use: No    Comment: history of cocaine abuse last used 2008     Family History  Problem Relation Age of Onset  . Heart disease Father   . Hypertension Father   . Hypertension Mother   . Heart disease Mother      Current Meds  Medication Sig  . albuterol (PROVENTIL HFA;VENTOLIN HFA) 108 (90 BASE) MCG/ACT inhaler Inhale 2 puffs into the lungs every 4 (four) hours as needed for wheezing or shortness of breath.   Marland Kitchen aspirin 81 MG chewable tablet Chew 81 mg by mouth daily.  . bacitracin ointment Apply 1 application topically 2 (two) times daily. (Patient taking differently: Apply 1 application topically 2 (two) times daily as needed for wound care. )  . carvedilol (COREG) 25 MG tablet Take 1 tablet (25 mg total) by mouth 2 (two) times daily with a meal.  . diphenhydrAMINE (BENADRYL) 25 MG tablet Take 1 tablet (25 mg total) by mouth every 6 (six) hours as needed for itching (Rash).  . furosemide (LASIX) 40 MG tablet Take 1 tablet (40 mg total) by  mouth daily.  . hydrocortisone cream 1 % Apply to affected area 2 times daily (Patient taking differently: Apply 1 application topically 2 (two) times daily as needed for itching. )  . ibuprofen (ADVIL,MOTRIN) 200 MG tablet Take 200-400 mg by mouth every 6 (six) hours as needed for headache or mild pain.   Marland Kitchen lisinopril (PRINIVIL,ZESTRIL) 10 MG tablet Take 1 tablet (10 mg total) by mouth daily.  . methocarbamol (ROBAXIN) 500  MG tablet Take 500 mg by mouth daily as needed for muscle spasms.  . Multiple Vitamin (MULTIVITAMIN WITH MINERALS) TABS Take 1 tablet by mouth daily.  . naproxen (NAPROSYN) 500 MG tablet Take 500 mg by mouth 2 (two) times daily as needed for moderate pain.   . potassium chloride SA (K-DUR,KLOR-CON) 20 MEQ tablet Take 1 tablet (20 mEq total) by mouth daily.  . traMADol (ULTRAM) 50 MG tablet Take 1 tablet (50 mg total) by mouth every 6 (six) hours as needed. FOR PAIN (Patient taking differently: Take 50 mg by mouth every 6 (six) hours as needed for moderate pain. )     Review of Systems negative except from HPI and PMH  Physical Exam BP 116/78 (BP Location: Right Arm)   Pulse 61   Temp 97.6 F (36.4 C) (Oral)   Ht 5\' 3"  (1.6 m)   Wt 140 lb (63.5 kg)   SpO2 100%   BMI 24.80 kg/m   Well developed and well nourished in no acute distress HENT normal E scleral and icterus clear Neck Supple JVP flat; carotids brisk and full Clear to ausculation Device quite prominent egular rate and rhythm, no murmurs gallops or rub Soft with active bowel sounds No clubbing cyanosis  Edema Alert and oriented, grossly normal motor and sensory function Skin Warm and Dry    Assessment and  Plan  Aborted cardiac Arrest  NICM  CRT-D St Jude   Hypertension  CHF chronic systolic  \ For ICD generator change and pocket revision given caudal migration of the device   We have reviewed the benefits and risks of generator replacement.  These include but are not limited to  lead fracture and infection.  The patient understands, agrees and is willing to proceed.

## 2017-12-29 NOTE — Discharge Instructions (Signed)

## 2017-12-30 ENCOUNTER — Encounter (HOSPITAL_COMMUNITY): Payer: Self-pay | Admitting: Internal Medicine

## 2017-12-30 MED FILL — Gentamicin Sulfate Inj 40 MG/ML: INTRAMUSCULAR | Qty: 80 | Status: AC

## 2017-12-31 ENCOUNTER — Telehealth: Payer: Self-pay | Admitting: Internal Medicine

## 2017-12-31 ENCOUNTER — Other Ambulatory Visit: Payer: Self-pay

## 2017-12-31 ENCOUNTER — Telehealth: Payer: Self-pay | Admitting: Cardiovascular Disease

## 2017-12-31 MED ORDER — POTASSIUM CHLORIDE CRYS ER 20 MEQ PO TBCR: 20.0000 meq | EXTENDED_RELEASE_TABLET | Freq: Every day | ORAL | 3 refills | Status: DC

## 2017-12-31 MED ORDER — CARVEDILOL 25 MG PO TABS: 25.0000 mg | ORAL_TABLET | Freq: Two times a day (BID) | ORAL | 3 refills | Status: DC

## 2017-12-31 NOTE — Telephone Encounter (Signed)
Returned patient's call during which she expressed frustration because her device site was hurting. She described it as a constant ache that was different from her baseline pain. I explained that we would anticipate the area to be uncomfortable during this time as she was implanted on 12/29/17. She reported taking Aleve without much success in pain management. She additionally requested that the device be removed as she felt like her body was rejecting it. With her history of pain at the device site I offered that she come into the office for me to assess the incision. She declined to come into the office. I recommended that she try tylenol, taking it as prescribed, around the clock to prevent breakthrough pain. She was agreeable to this plan stating she would go to the ED should the pain continue to worsen.

## 2017-12-31 NOTE — Telephone Encounter (Signed)
New message    Patient calling with concerns about pain at pacemaker site. Please call

## 2017-12-31 NOTE — Telephone Encounter (Signed)
New message     *STAT* If patient is at the pharmacy, call can be transferred to refill team.   1. Which medications need to be refilled? (please list name of each medication and dose if known) potassium chloride SA (K-DUR,KLOR-CON) 20 MEQ tablet and carvedilol (COREG) 25 MG tablet  2. Which pharmacy/location (including street and city if local pharmacy) is medication to be sent to? [facility]   [address] [place]  3. Do they need a 30 day or 90 day supply? 90

## 2018-01-04 ENCOUNTER — Telehealth: Payer: Self-pay | Admitting: Internal Medicine

## 2018-01-04 NOTE — Telephone Encounter (Signed)
Angela Payne is experiencing pain around the device site, she reports that she has had pain since original implant in 2011 but that it is worse now. She's taken tylenol with minimal relief. She wants the device out because it is "not feasible". She became tearful on the phone with me. Denies fever/chills, swelling drainage. She does say that it is bruised at the site. I recommended that she speak with Dr. Graciela Husbands as this is not a decision that we can make in the Device Clinic. Dr. Graciela Husbands is in the office on 2/11 when she is scheduled for her wound check. I will speak with Dr. Graciela Husbands and call back with any additional recommendations or earlier follow-up. She verbalizes understanding.

## 2018-01-04 NOTE — Telephone Encounter (Signed)
°  1. Has your device fired? no 2. Is you device beeping? no 3. Are you experiencing draining or swelling at device site?no 4. Are you calling to see if we received your device transmission? no  5. Have you passed out? no   Pt verbalized that she want the device taken out because its causing her too much pain    Please route to Device Clinic Pool

## 2018-01-04 NOTE — Telephone Encounter (Signed)
Dr. Graciela Husbands agreeable to speak with patient while here 01/10/18. Patient aware.

## 2018-01-10 ENCOUNTER — Ambulatory Visit: Payer: Medicaid Other

## 2018-01-19 ENCOUNTER — Ambulatory Visit (INDEPENDENT_AMBULATORY_CARE_PROVIDER_SITE_OTHER): Payer: Medicaid Other | Admitting: *Deleted

## 2018-01-19 DIAGNOSIS — I428 Other cardiomyopathies: Secondary | ICD-10-CM

## 2018-01-19 LAB — CUP PACEART INCLINIC DEVICE CHECK
Battery Remaining Longevity: 74 mo
Brady Statistic RA Percent Paced: 2.5 %
Brady Statistic RV Percent Paced: 97 %
Date Time Interrogation Session: 20190220154259
HIGH POWER IMPEDANCE MEASURED VALUE: 35.4886
Implantable Lead Implant Date: 20110819
Implantable Lead Implant Date: 20110819
Implantable Lead Location: 753858
Lead Channel Impedance Value: 287.5 Ohm
Lead Channel Impedance Value: 862.5 Ohm
Lead Channel Pacing Threshold Amplitude: 0.5 V
Lead Channel Pacing Threshold Amplitude: 1 V
Lead Channel Pacing Threshold Amplitude: 1.25 V
Lead Channel Pacing Threshold Pulse Width: 0.5 ms
Lead Channel Pacing Threshold Pulse Width: 0.5 ms
Lead Channel Pacing Threshold Pulse Width: 0.5 ms
Lead Channel Pacing Threshold Pulse Width: 0.6 ms
Lead Channel Sensing Intrinsic Amplitude: 12 mV
Lead Channel Sensing Intrinsic Amplitude: 2.3 mV
Lead Channel Setting Pacing Amplitude: 2 V
Lead Channel Setting Pacing Pulse Width: 0.5 ms
Lead Channel Setting Sensing Sensitivity: 0.5 mV
MDC IDC LEAD IMPLANT DT: 20110819
MDC IDC LEAD LOCATION: 753859
MDC IDC LEAD LOCATION: 753860
MDC IDC MSMT LEADCHNL LV PACING THRESHOLD AMPLITUDE: 1.25 V
MDC IDC MSMT LEADCHNL LV PACING THRESHOLD PULSEWIDTH: 0.6 ms
MDC IDC MSMT LEADCHNL RA PACING THRESHOLD AMPLITUDE: 0.5 V
MDC IDC MSMT LEADCHNL RA PACING THRESHOLD PULSEWIDTH: 0.5 ms
MDC IDC MSMT LEADCHNL RV IMPEDANCE VALUE: 462.5 Ohm
MDC IDC MSMT LEADCHNL RV PACING THRESHOLD AMPLITUDE: 1 V
MDC IDC PG IMPLANT DT: 20190130
MDC IDC PG SERIAL: 9782720
MDC IDC SET LEADCHNL LV PACING AMPLITUDE: 2.5 V
MDC IDC SET LEADCHNL LV PACING PULSEWIDTH: 0.6 ms
MDC IDC SET LEADCHNL RV PACING AMPLITUDE: 2.5 V

## 2018-01-19 NOTE — Progress Notes (Signed)
Wound check appointment. Steri-strips removed by patient at home. Wound without redness or edema. Incision edges approximated, wound well healed. Normal device function. Thresholds, sensing, and impedances consistent with implant measurements. Device programmed at chronic values s/p gen change. Histogram distribution appropriate for patient and level of activity. No mode switches or ventricular arrhythmias noted.   Extensive conversation had between patient and SK to address patients request that her CRT-D be removed. Patient agreeable to thinking about her options and coming back in 4-6 weeks to discuss more with SK. ROV with DC to see SK 3/26

## 2018-02-22 ENCOUNTER — Ambulatory Visit (INDEPENDENT_AMBULATORY_CARE_PROVIDER_SITE_OTHER): Payer: Medicaid Other | Admitting: Internal Medicine

## 2018-02-22 ENCOUNTER — Encounter: Payer: Self-pay | Admitting: Internal Medicine

## 2018-02-22 ENCOUNTER — Encounter (INDEPENDENT_AMBULATORY_CARE_PROVIDER_SITE_OTHER): Payer: Self-pay

## 2018-02-22 VITALS — BP 114/70 | HR 74 | Ht 63.0 in | Wt 121.0 lb

## 2018-02-22 DIAGNOSIS — I428 Other cardiomyopathies: Secondary | ICD-10-CM

## 2018-02-22 DIAGNOSIS — Z95810 Presence of automatic (implantable) cardiac defibrillator: Secondary | ICD-10-CM

## 2018-02-22 NOTE — Patient Instructions (Addendum)
Medication Instructions:  Your physician recommends that you continue on your current medications as directed. Please refer to the Current Medication list given to you today.  Labwork: None ordered.  Testing/Procedures: None ordered.  Follow-Up: Your physician recommends that you schedule a follow-up appointment in:  Mar 31 2018 with Dr Graciela Husbands   Remote monitoring is used to monitor your ICD from home. This monitoring reduces the number of office visits required to check your device to one time per year. It allows [place] to keep an eye on the functioning of your device to ensure it is working properly. You are scheduled for a device check from home on 04/20/2018. You may send your transmission at any time that day. If you have a wireless device, the transmission will be sent automatically. After your physician reviews your transmission, you will receive a postcard with your next transmission date.   Any Other Special Instructions Will Be Listed Below (If Applicable).     If you need a refill on your cardiac medications before your next appointment, please call your pharmacy.

## 2018-02-22 NOTE — Progress Notes (Signed)
.      Patient Care Team: Care, Jovita Kussmaul [facility] as PCP - General (Family Medicine) Mack Hook, MD as Consulting Physician (Orthopedic Surgery)   HPI  Angela Payne is a 57 y.o. female Seen in follow-up for an ICD implanted originally for aborted cardiac arrest in the context of a nonischemic myopathy.  She underwent generator replacement 2/19 and came in for her wound check may need to have her device explanted.  I asked her to think about it for a couple of weeks and she returns today.  She is willing to keep the device in place.  Functional status is stable.  Her she is tolerating her medications.  Records and Results Reviewed   Past Medical History:  Diagnosis Date  . Anemia, unspecified   . Atrial fibrillation (HCC)    a. This occurred in setting of resuscitation efforts during VF arrest.  . Automatic implantable cardiac defibrillator in situ    a. 06/2010 s/p  s/p SJM 3231-40 Bi V ICD Ser # [id number].  Marland Kitchen Automatic implantable cardioverter-defibrillator in situ   . Cardiac arrest (HCC)    a. 06/2010  . CHF (congestive heart failure) (HCC)   . Chronic systolic heart failure (HCC)   . Depression    ok  . GERD (gastroesophageal reflux disease)   . History of cocaine abuse    remote  . History of tobacco abuse   . Hypertension   . ICD (implantable cardioverter-defibrillator) lead failure--noise of both leads 6/15 isolated 08/13/2015  . Nonischemic cardiomyopathy (HCC)    a. 06/2010 OOH VF arrest;  b. 06/2010 Cath: nonobs dzs, EF 15-20%, glob HK;  c. 06/2010 Echo: EF 15%, diff HK, Gr 2 DD;  d. 06/2010 Cardiac MRI EF of 29%, no scar;  e. 06/2010 s/p SJM 3231-40 Bi V ICD Ser # [id number].  Marland Kitchen Other left bundle branch block   . Pacemaker   . Paroxysmal supraventricular tachycardia (HCC)   . Shortness of breath    with exertion  . SUBSTANCE ABUSE, MULTIPLE 08/13/2010   Qualifier: Diagnosis of  By: Denyse Amass CMA, Carol    . Thyroid disease     Past Surgical History:    Procedure Laterality Date  . BIV ICD GENERATOR CHANGEOUT N/A 12/29/2017   Procedure: BIV ICD GENERATOR CHANGEOUT;  Surgeon: Duke Salvia, MD;  Location: [facility] INVASIVE CV LAB;  Service: Cardiovascular;  Laterality: N/A;  . CARDIAC DEFIBRILLATOR PLACEMENT     St Jude [employer] CD [id number] Q defib, serial number [id number]  . CARDIAC DEFIBRILLATOR PLACEMENT    . OPEN REDUCTION INTERNAL FIXATION (ORIF) DISTAL RADIAL FRACTURE Left 05/07/2014   Procedure: OPEN REDUCTION INTERNAL FIXATION (ORIF) DISTAL RADIAL FRACTURE;  Surgeon: Jodi Marble, MD;  Location: [facility] OR;  Service: Orthopedics;  Laterality: Left;    Current Meds  Medication Sig  . albuterol (PROVENTIL HFA;VENTOLIN HFA) 108 (90 BASE) MCG/ACT inhaler Inhale 2 puffs into the lungs every 4 (four) hours as needed for wheezing or shortness of breath.   Marland Kitchen aspirin 81 MG chewable tablet Chew 81 mg by mouth daily.  . bacitracin ointment Apply 1 application topically 2 (two) times daily.  . carvedilol (COREG) 25 MG tablet Take 1 tablet (25 mg total) by mouth 2 (two) times daily with a meal.  . diphenhydrAMINE (BENADRYL) 25 MG tablet Take 1 tablet (25 mg total) by mouth every 6 (six) hours as needed for itching (Rash).  . furosemide (LASIX) 40 MG tablet Take 1 tablet (40 mg total)  by mouth daily.  . hydrocortisone cream 1 % Apply to affected area 2 times daily  . ibuprofen (ADVIL,MOTRIN) 200 MG tablet Take 200-400 mg by mouth every 6 (six) hours as needed for headache or mild pain.   Marland Kitchen lisinopril (PRINIVIL,ZESTRIL) 10 MG tablet Take 1 tablet (10 mg total) by mouth daily.  . methocarbamol (ROBAXIN) 500 MG tablet Take 500 mg by mouth daily as needed for muscle spasms.  . Multiple Vitamin (MULTIVITAMIN WITH MINERALS) TABS Take 1 tablet by mouth daily.  . naproxen (NAPROSYN) 500 MG tablet Take 500 mg by mouth 2 (two) times daily as needed for moderate pain.   . potassium chloride SA (K-DUR,KLOR-CON) 20 MEQ tablet Take 1 tablet (20 mEq total) by mouth daily.  .  traMADol (ULTRAM) 50 MG tablet Take 1 tablet (50 mg total) by mouth every 6 (six) hours as needed. FOR PAIN    No Known Allergies    Review of Systems negative except from HPI and PMH  Physical Exam BP 114/70   Pulse 74   Ht 5\' 3"  (1.6 m)   Wt 121 lb (54.9 kg)   SpO2 98%   BMI 21.43 kg/m  Well developed and well nourished in no acute distress HENT normal E scleral and icterus clear Neck Supple JVP flat; carotids brisk and full Clear to ausculation Device pocket well healed; without hematoma or erythema.  There is no tethering  Regular rate and rhythm, no murmurs gallops or rub Soft with active bowel sounds No clubbing cyanosis  Edema Alert and oriented, grossly normal motor and sensory function Skin Warm and Dry    Assessment and  Plan   Aborted cardiac Arrest  NICM  CRT-D St Jude   Hypertension  CHF chronic systolic   Euvolemic continue current meds  Tolerating meds    BP well controlled   Current medicines are reviewed at length with the patient today .  The patient does not  have concerns regarding medicines.

## 2018-03-02 ENCOUNTER — Encounter: Payer: Medicaid Other | Admitting: Obstetrics and Gynecology

## 2018-03-03 ENCOUNTER — Encounter: Payer: Self-pay | Admitting: Family Medicine

## 2018-03-03 ENCOUNTER — Encounter: Payer: Medicaid Other | Admitting: Family Medicine

## 2018-03-03 ENCOUNTER — Encounter: Payer: Self-pay | Admitting: General Practice

## 2018-03-03 NOTE — Progress Notes (Signed)
Patient did not keep appointment today. She may call to reschedule.  

## 2018-03-03 NOTE — Progress Notes (Unsigned)
Patient no showed for appt today, per Dr Pratt patient can reschedule on her own 

## 2018-03-04 ENCOUNTER — Encounter: Payer: Medicaid Other | Admitting: Obstetrics and Gynecology

## 2018-03-22 ENCOUNTER — Encounter: Payer: Self-pay | Admitting: Internal Medicine

## 2018-03-31 ENCOUNTER — Encounter: Payer: Medicaid Other | Admitting: Internal Medicine

## 2018-04-01 ENCOUNTER — Encounter: Payer: Self-pay | Admitting: Internal Medicine

## 2018-04-01 ENCOUNTER — Other Ambulatory Visit: Payer: Self-pay

## 2018-04-01 DIAGNOSIS — Z1231 Encounter for screening mammogram for malignant neoplasm of breast: Secondary | ICD-10-CM

## 2018-05-03 ENCOUNTER — Ambulatory Visit: Payer: Medicaid Other

## 2018-05-12 ENCOUNTER — Encounter: Payer: Medicaid Other | Admitting: Family Medicine

## 2018-05-12 ENCOUNTER — Encounter: Payer: Self-pay | Admitting: Family Medicine

## 2018-05-12 NOTE — Progress Notes (Signed)
Patient did not keep appointment today. She may call to reschedule.  

## 2018-05-17 ENCOUNTER — Ambulatory Visit: Payer: Medicaid Other

## 2018-07-06 ENCOUNTER — Telehealth: Payer: Self-pay | Admitting: Internal Medicine

## 2018-08-02 ENCOUNTER — Telehealth: Payer: Self-pay | Admitting: Internal Medicine

## 2018-08-02 NOTE — Telephone Encounter (Signed)
   Primary Cardiologist: Dr. Graciela Husbands   Chart reviewed as part of pre-operative protocol coverage. Given past medical history and time since last visit, based on ACC/AHA guidelines, Camri A Hamrick would be at acceptable risk for the planned procedure without further cardiovascular testing. No antibiotic prophylaxis needed for ICD (reviewed with DOD Dr. Anne Fu as well). Can hold ASA 5-7 days prior to procedure if needed.   I will route this recommendation to the requesting party via Epic fax function and remove from pre-op pool.  Please call with questions.  [place], [name] 08/02/2018, 4:41 PM

## 2018-08-02 NOTE — Telephone Encounter (Signed)
° ° °  1. What dental office are you calling from? [facility]  2. What is your office phone number? [phone]  3. What is your fax number? [phone]  4. What type of procedure is the patient having performed? 4 extractions  5. What date is procedure scheduled or is the patient there now? 08/09/2018  6. What is your question (ex. Antibiotics prior to procedure, holding medication-we need to know how long dentist wants pt to hold med)? Pre antibiotic? Hold any meds?

## 2018-08-05 ENCOUNTER — Telehealth: Payer: Self-pay | Admitting: Internal Medicine

## 2018-08-05 NOTE — Telephone Encounter (Signed)
Informed patient that pre-op clearance was sent manual fax. Pt verbalized understanding and thanked me for the call.

## 2018-08-05 NOTE — Telephone Encounter (Signed)
Please send clearance note manually and close encounter.

## 2018-08-05 NOTE — Telephone Encounter (Signed)
Follow Up:   e Pt calling to check on the status of her clearance.. She said her doctor still have not recived her clearance. Please fax today if possible please to [phone]-

## 2018-08-25 ENCOUNTER — Telehealth: Payer: Self-pay | Admitting: Cardiovascular Disease

## 2018-08-25 DIAGNOSIS — R079 Chest pain, unspecified: Principal | ICD-10-CM

## 2018-08-25 DIAGNOSIS — G8929 Other chronic pain: Secondary | ICD-10-CM
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
## 2018-08-25 NOTE — Telephone Encounter (Signed)
New message   Patient states that she needs a referral. Please call to discuss.

## 2018-08-26 NOTE — Telephone Encounter (Signed)
Pat, I am ok referring her to the pain clinic if she wishes. Thayer Ohm

## 2018-08-29 NOTE — Telephone Encounter (Signed)
I spoke with pt and gave her information from Dr. [name].  Pt would like referral to [facility] on [address] in [place].  Phone number--[phone]. Pt would like call back once referral is made.   I placed call to [facility] and left message on voicemail for Leone Haven  (coordinator for pain management clinic) to call our office regarding referral for pt.

## 2018-08-31 NOTE — Telephone Encounter (Signed)
I followed up with [facility] and was transferred to voicemail for [facility].  Left message to call office. I placed call to pt and left message to call back

## 2018-08-31 NOTE — Telephone Encounter (Signed)
Follow up    Patient states that she needs a return call. The patient states that she needs to give you the correct fax #.

## 2018-08-31 NOTE — Telephone Encounter (Signed)
I spoke with Marchelle Folks and notes/demographic information should be faxed to her.  She will then contact pt with appointment information. Direct fax number for Marchelle Folks is [phone].  If this fax is busy an alternate fax number is [phone]

## 2018-08-31 NOTE — Telephone Encounter (Signed)
Follow Up:     Marchelle Folks with [facility] returned a call    Contact: [phone]

## 2018-09-01 NOTE — Telephone Encounter (Signed)
Records to be faxed to [facility] today

## 2018-09-05 ENCOUNTER — Ambulatory Visit: Payer: Medicaid Other | Admitting: Cardiovascular Disease

## 2018-09-07 NOTE — Telephone Encounter (Signed)
Fax received in office that pt is aware of appointment on 09/12/18 with Matilde Haymaker, NP.

## 2018-09-08 ENCOUNTER — Encounter: Payer: Self-pay | Admitting: Cardiovascular Disease

## 2018-09-08 ENCOUNTER — Ambulatory Visit (INDEPENDENT_AMBULATORY_CARE_PROVIDER_SITE_OTHER): Payer: Medicaid Other | Admitting: Cardiovascular Disease

## 2018-09-08 VITALS — BP 110/60 | HR 72 | Ht 63.0 in | Wt 121.1 lb

## 2018-09-08 DIAGNOSIS — I428 Other cardiomyopathies: Secondary | ICD-10-CM

## 2018-09-08 DIAGNOSIS — I480 Paroxysmal atrial fibrillation: Secondary | ICD-10-CM

## 2018-09-08 DIAGNOSIS — I5022 Chronic systolic (congestive) heart failure: Secondary | ICD-10-CM | POA: Diagnosis not present

## 2018-09-08 NOTE — Progress Notes (Signed)
Chief Complaint  Patient presents with  . Follow-up    NICM   History of Present Illness: 58 yo female with history of non-ischemic cardiomyopathy, prior VF arrest August 2011, PAF here today for cardiac follow up. She was admitted to [facility] August 2011 after an out of hospital ventricular fibrillation arrest. No evidence of CAD by cath in 2011. Echo in 2011 showed LVEF=15%. Cardiac MRI in 2011 showed EF of 29%. Her cardiomyopathy was presumed to be non-ischemic, possibly from the history of cocaine abuse. She had an ICD implanted by Dr. Graciela Husbands on July 18, 2010. She has had chronic chest pain since her cardiac arrest. Echo August 2018 with LVEF=40-45%, grade 2 diastolic dysfunction, mild MR.  ICD generator change February 2019.   She is here today for follow up. The patient denies any chest pain, dyspnea, palpitations, lower extremity edema, orthopnea, PND, dizziness, near syncope or syncope.    Primary Care Physician: Care, Jovita Kussmaul [facility]  Past Medical History:  Diagnosis Date  . Anemia, unspecified   . Atrial fibrillation (HCC)    a. This occurred in setting of resuscitation efforts during VF arrest.  . Automatic implantable cardiac defibrillator in situ    a. 06/2010 s/p  s/p SJM 3231-40 Bi V ICD Ser # [id number].  Marland Kitchen Automatic implantable cardioverter-defibrillator in situ   . Cardiac arrest (HCC)    a. 06/2010  . CHF (congestive heart failure) (HCC)   . Chronic systolic heart failure (HCC)   . Depression    ok  . GERD (gastroesophageal reflux disease)   . History of cocaine abuse (HCC)    remote  . History of tobacco abuse   . Hypertension   . ICD (implantable cardioverter-defibrillator) lead failure--noise of both leads 6/15 isolated 08/13/2015  . Nonischemic cardiomyopathy (HCC)    a. 06/2010 OOH VF arrest;  b. 06/2010 Cath: nonobs dzs, EF 15-20%, glob HK;  c. 06/2010 Echo: EF 15%, diff HK, Gr 2 DD;  d. 06/2010 Cardiac MRI EF of 29%, no scar;  e. 06/2010 s/p SJM  3231-40 Bi V ICD Ser # [id number].  Marland Kitchen Other left bundle branch block   . Pacemaker   . Paroxysmal supraventricular tachycardia (HCC)   . Shortness of breath    with exertion  . SUBSTANCE ABUSE, MULTIPLE 08/13/2010   Qualifier: Diagnosis of  By: Denyse Amass CMA, Carol    . Thyroid disease     Past Surgical History:  Procedure Laterality Date  . BIV ICD GENERATOR CHANGEOUT N/A 12/29/2017   Procedure: BIV ICD GENERATOR CHANGEOUT;  Surgeon: Duke Salvia, MD;  Location: [facility] INVASIVE CV LAB;  Service: Cardiovascular;  Laterality: N/A;  . CARDIAC DEFIBRILLATOR PLACEMENT     St Jude Unify CD Q323020 Q defib, serial number [id number]  . CARDIAC DEFIBRILLATOR PLACEMENT    . OPEN REDUCTION INTERNAL FIXATION (ORIF) DISTAL RADIAL FRACTURE Left 05/07/2014   Procedure: OPEN REDUCTION INTERNAL FIXATION (ORIF) DISTAL RADIAL FRACTURE;  Surgeon: Jodi Marble, MD;  Location: [facility] OR;  Service: Orthopedics;  Laterality: Left;    Current Outpatient Medications  Medication Sig Dispense Refill  . albuterol (PROVENTIL HFA;VENTOLIN HFA) 108 (90 BASE) MCG/ACT inhaler Inhale 2 puffs into the lungs every 4 (four) hours as needed for wheezing or shortness of breath.     Marland Kitchen aspirin 81 MG chewable tablet Chew 81 mg by mouth daily.    . bacitracin ointment Apply 1 application topically 2 (two) times daily. 120 g 0  . carvedilol (COREG)  25 MG tablet Take 1 tablet (25 mg total) by mouth 2 (two) times daily with a meal. 180 tablet 3  . diphenhydrAMINE (BENADRYL) 25 MG tablet Take 1 tablet (25 mg total) by mouth every 6 (six) hours as needed for itching (Rash). 30 tablet 0  . furosemide (LASIX) 40 MG tablet Take 1 tablet (40 mg total) by mouth daily. 90 tablet 2  . hydrocortisone cream 1 % Apply to affected area 2 times daily 15 g 0  . ibuprofen (ADVIL,MOTRIN) 200 MG tablet Take 200-400 mg by mouth every 6 (six) hours as needed for headache or mild pain.     Marland Kitchen lisinopril (PRINIVIL,ZESTRIL) 10 MG tablet Take 1 tablet (10 mg total) by  mouth daily. 30 tablet 11  . methocarbamol (ROBAXIN) 500 MG tablet Take 500 mg by mouth daily as needed for muscle spasms.    . Multiple Vitamin (MULTIVITAMIN WITH MINERALS) TABS Take 1 tablet by mouth daily.    . naproxen (NAPROSYN) 500 MG tablet Take 500 mg by mouth 2 (two) times daily as needed for moderate pain.     . potassium chloride SA (K-DUR,KLOR-CON) 20 MEQ tablet Take 1 tablet (20 mEq total) by mouth daily. 90 tablet 3  . traMADol (ULTRAM) 50 MG tablet Take 1 tablet (50 mg total) by mouth every 6 (six) hours as needed. FOR PAIN 30 tablet 0   No current facility-administered medications for this visit.     No Known Allergies  Social History   Socioeconomic History  . Marital status: Married    Spouse name: Not on file  . Number of children: 4  . Years of education: Not on file  . Highest education level: Not on file  Occupational History  . Occupation: Disablity  Social Needs  . Financial resource strain: Not on file  . Food insecurity:    Worry: Not on file    Inability: Not on file  . Transportation needs:    Medical: Not on file    Non-medical: Not on file  Tobacco Use  . Smoking status: Former Smoker    Last attempt to quit: 07/28/2011    Years since quitting: 7.1  . Smokeless tobacco: Never Used  Substance and Sexual Activity  . Alcohol use: No  . Drug use: No    Comment: history of cocaine abuse last used 2008  . Sexual activity: Yes    Birth control/protection: IUD  Lifestyle  . Physical activity:    Days per week: Not on file    Minutes per session: Not on file  . Stress: Not on file  Relationships  . Social connections:    Talks on phone: Not on file    Gets together: Not on file    Attends religious service: Not on file    Active member of club or organization: Not on file    Attends meetings of clubs or organizations: Not on file    Relationship status: Not on file  . Intimate partner violence:    Fear of current or ex partner: Not on file     Emotionally abused: Not on file    Physically abused: Not on file    Forced sexual activity: Not on file  Other Topics Concern  . Not on file  Social History Narrative  . Not on file    Family History  Problem Relation Age of Onset  . Heart disease Father   . Hypertension Father   . Hypertension Mother   . Heart  disease Mother     Review of Systems:  As stated in the HPI and otherwise negative.   BP 110/60   Pulse 72   Ht 5\' 3"  (1.6 m)   Wt 121 lb 1.9 oz (54.9 kg)   SpO2 98%   BMI 21.46 kg/m   Physical Examination:   General: Well developed, well nourished, NAD  HEENT: OP clear, mucus membranes moist  SKIN: warm, dry. No rashes. Neuro: No focal deficits  Musculoskeletal: Muscle strength 5/5 all ext  Psychiatric: Mood and affect normal  Neck: No JVD, no carotid bruits, no thyromegaly, no lymphadenopathy.  Lungs:Clear bilaterally, no wheezes, rhonci, crackles Cardiovascular: Regular rate and rhythm. No murmurs, gallops or rubs. Abdomen:Soft. Bowel sounds present. Non-tender.  Extremities: No lower extremity edema. Pulses are 2 + in the bilateral DP/PT.  Echo August 2018: Left ventricle: The cavity size was normal. Posterior wall   thickness was increased in a pattern of mild LVH. Systolic   function was mildly to moderately reduced. The estimated ejection   fraction was in the range of 40% to 45%. Diffuse hypokinesis.   Features are consistent with a pseudonormal left ventricular   filling pattern, with concomitant abnormal relaxation and   increased filling pressure (grade 2 diastolic dysfunction).   Doppler parameters are consistent with indeterminate ventricular   filling pressure. - Mitral valve: Transvalvular velocity was within the normal range.   There was no evidence for stenosis. There was mild regurgitation. - Left atrium: The atrium was mildly dilated. - Right ventricle: The cavity size was normal. Wall thickness was   normal. Systolic function was  normal. - Atrial septum: No defect or patent foramen ovale was identified   by color flow Doppler. - Tricuspid valve: There was mild regurgitation. - Pulmonary arteries: Systolic pressure was within the normal   range. PA peak pressure: 23 mm Hg (S).  EKG:  EKG is not ordered today.  The EKG demonstrates   Recent Labs: 12/23/2017: BUN 23; Creatinine, Ser 1.15; Hemoglobin 12.6; Platelets 231; Potassium 4.1; Sodium 143   Lipid Panel    Component Value Date/Time   CHOL 179 11/10/2012 1402   TRIG 97.0 11/10/2012 1402   HDL 34.80 (L) 11/10/2012 1402   CHOLHDL 5 11/10/2012 1402   VLDL 19.4 11/10/2012 1402   LDLCALC 125 (H) 11/10/2012 1402     Wt Readings from Last 3 Encounters:  09/08/18 121 lb 1.9 oz (54.9 kg)  02/22/18 121 lb (54.9 kg)  12/29/17 140 lb (63.5 kg)     Other studies Reviewed: Additional studies/ records that were reviewed today include: ED records   Assessment and Plan:   1. Non ischemic cardiomyopathy: LVEF=40% by echo in 2018. ICD in place. will continue Coreg and Lisinopril. We could consider Entresto.    2. Chronic systolic CHF: Volume status is stable. Weight is stable. Continue Lasix most days.   3. Atrial fib, paroxysmal: Sinus today.   4. Substance abuse: In remission  Current medicines are reviewed at length with the patient today.  The patient does not have concerns regarding medicines.  The following changes have been made:  no change  Labs/ tests ordered today include: echo  No orders of the defined types were placed in this encounter.   Disposition:   FU with me in 12 months  Signed, Verne Carrow, MD 09/08/2018 2:22 PM    [facility] [address] [place], [place], [place]  [address] Phone: [phone]; Fax: [phone]

## 2018-09-08 NOTE — Patient Instructions (Signed)

## 2018-09-14 ENCOUNTER — Encounter: Payer: Medicaid Other | Admitting: Internal Medicine

## 2018-10-17 ENCOUNTER — Ambulatory Visit (INDEPENDENT_AMBULATORY_CARE_PROVIDER_SITE_OTHER): Payer: Medicaid Other | Admitting: Internal Medicine

## 2018-10-17 ENCOUNTER — Encounter: Payer: Self-pay | Admitting: Internal Medicine

## 2018-10-17 VITALS — BP 110/74 | HR 67 | Ht 63.0 in | Wt 123.0 lb

## 2018-10-17 DIAGNOSIS — I5022 Chronic systolic (congestive) heart failure: Secondary | ICD-10-CM | POA: Diagnosis not present

## 2018-10-17 DIAGNOSIS — I428 Other cardiomyopathies: Secondary | ICD-10-CM

## 2018-10-17 DIAGNOSIS — Z95810 Presence of automatic (implantable) cardiac defibrillator: Secondary | ICD-10-CM

## 2018-10-17 LAB — CUP PACEART INCLINIC DEVICE CHECK
Battery Remaining Longevity: 69 mo
HighPow Impedance: 41.5651
Implantable Lead Implant Date: 20110819
Implantable Lead Implant Date: 20110819
Implantable Lead Location: 753858
Implantable Lead Location: 753859
Implantable Lead Location: 753860
Implantable Pulse Generator Implant Date: 20190130
Lead Channel Impedance Value: 337.5 Ohm
Lead Channel Impedance Value: 512.5 Ohm
Lead Channel Pacing Threshold Amplitude: 0.5 V
Lead Channel Pacing Threshold Amplitude: 1.25 V
Lead Channel Pacing Threshold Pulse Width: 0.5 ms
Lead Channel Pacing Threshold Pulse Width: 0.6 ms
Lead Channel Sensing Intrinsic Amplitude: 12 mV
Lead Channel Sensing Intrinsic Amplitude: 2.8 mV
Lead Channel Setting Pacing Amplitude: 2 V
Lead Channel Setting Pacing Amplitude: 2.5 V
Lead Channel Setting Pacing Pulse Width: 0.5 ms
Lead Channel Setting Pacing Pulse Width: 0.6 ms
MDC IDC LEAD IMPLANT DT: 20110819
MDC IDC MSMT LEADCHNL LV IMPEDANCE VALUE: 937.5 Ohm
MDC IDC MSMT LEADCHNL RA PACING THRESHOLD PULSEWIDTH: 0.5 ms
MDC IDC MSMT LEADCHNL RV PACING THRESHOLD AMPLITUDE: 0.75 V
MDC IDC PG SERIAL: 9782720
MDC IDC SESS DTM: 20191118101851
MDC IDC SET LEADCHNL RV PACING AMPLITUDE: 2.5 V
MDC IDC SET LEADCHNL RV SENSING SENSITIVITY: 0.5 mV
MDC IDC STAT BRADY RA PERCENT PACED: 4.9 %
MDC IDC STAT BRADY RV PERCENT PACED: 98 %

## 2018-10-17 NOTE — Progress Notes (Signed)
.      Patient Care Team: Care, Jovita Kussmaul [facility] as PCP - General (Family Medicine) Mack Hook, MD as Consulting Physician (Orthopedic Surgery)   HPI  Angela Payne is a 57 y.o. female Seen in follow-up for an ICD implanted originally for aborted cardiac arrest in the context of a nonischemic myopathy.  She underwent generator replacement 2/19 and came in for her wound check may need to have her device explanted.  Some sob and fatigue; no edema  Recent uri  Date Cr K  1/19 1.15 4.1        DATE TEST EF   8/11 cMRI  29 %   5/16 Echo  35-40%   8/18 Echo   40-45 %            Records and Results Reviewed   Past Medical History:  Diagnosis Date  . Anemia, unspecified   . Atrial fibrillation (HCC)    a. This occurred in setting of resuscitation efforts during VF arrest.  . Automatic implantable cardiac defibrillator in situ    a. 06/2010 s/p  s/p SJM 3231-40 Bi V ICD Ser # [id number].  Marland Kitchen Automatic implantable cardioverter-defibrillator in situ   . Cardiac arrest (HCC)    a. 06/2010  . CHF (congestive heart failure) (HCC)   . Chronic systolic heart failure (HCC)   . Depression    ok  . GERD (gastroesophageal reflux disease)   . History of cocaine abuse (HCC)    remote  . History of tobacco abuse   . Hypertension   . ICD (implantable cardioverter-defibrillator) lead failure--noise of both leads 6/15 isolated 08/13/2015  . Nonischemic cardiomyopathy (HCC)    a. 06/2010 OOH VF arrest;  b. 06/2010 Cath: nonobs dzs, EF 15-20%, glob HK;  c. 06/2010 Echo: EF 15%, diff HK, Gr 2 DD;  d. 06/2010 Cardiac MRI EF of 29%, no scar;  e. 06/2010 s/p SJM 3231-40 Bi V ICD Ser # [id number].  Marland Kitchen Other left bundle branch block   . Pacemaker   . Paroxysmal supraventricular tachycardia (HCC)   . Shortness of breath    with exertion  . SUBSTANCE ABUSE, MULTIPLE 08/13/2010   Qualifier: Diagnosis of  By: Denyse Amass CMA, Carol    . Thyroid disease     Past Surgical History:  Procedure  Laterality Date  . BIV ICD GENERATOR CHANGEOUT N/A 12/29/2017   Procedure: BIV ICD GENERATOR CHANGEOUT;  Surgeon: Duke Salvia, MD;  Location: [facility] INVASIVE CV LAB;  Service: Cardiovascular;  Laterality: N/A;  . CARDIAC DEFIBRILLATOR PLACEMENT     St Jude Unify CD Q323020 Q defib, serial number [id number]  . CARDIAC DEFIBRILLATOR PLACEMENT    . OPEN REDUCTION INTERNAL FIXATION (ORIF) DISTAL RADIAL FRACTURE Left 05/07/2014   Procedure: OPEN REDUCTION INTERNAL FIXATION (ORIF) DISTAL RADIAL FRACTURE;  Surgeon: Jodi Marble, MD;  Location: [facility] OR;  Service: Orthopedics;  Laterality: Left;    Current Meds  Medication Sig  . albuterol (PROVENTIL HFA;VENTOLIN HFA) 108 (90 BASE) MCG/ACT inhaler Inhale 2 puffs into the lungs every 4 (four) hours as needed for wheezing or shortness of breath.   Marland Kitchen aspirin 81 MG chewable tablet Chew 81 mg by mouth daily.  . carvedilol (COREG) 25 MG tablet Take 1 tablet (25 mg total) by mouth 2 (two) times daily with a meal.  . diphenhydrAMINE (BENADRYL) 25 MG tablet Take 1 tablet (25 mg total) by mouth every 6 (six) hours as needed for itching (Rash).  . furosemide (LASIX) 40  MG tablet Take 1 tablet (40 mg total) by mouth daily.  . hydrocortisone cream 1 % Apply to affected area 2 times daily  . ibuprofen (ADVIL,MOTRIN) 200 MG tablet Take 200-400 mg by mouth every 6 (six) hours as needed for headache or mild pain.   Marland Kitchen lisinopril (PRINIVIL,ZESTRIL) 10 MG tablet Take 1 tablet (10 mg total) by mouth daily.  . methocarbamol (ROBAXIN) 500 MG tablet Take 500 mg by mouth daily as needed for muscle spasms.  . Multiple Vitamin (MULTIVITAMIN WITH MINERALS) TABS Take 1 tablet by mouth daily.  . naproxen (NAPROSYN) 500 MG tablet Take 500 mg by mouth 2 (two) times daily as needed for moderate pain.   . potassium chloride SA (K-DUR,KLOR-CON) 20 MEQ tablet Take 1 tablet (20 mEq total) by mouth daily.  . traMADol (ULTRAM) 50 MG tablet Take 1 tablet (50 mg total) by mouth every 6 (six) hours  as needed. FOR PAIN    No Known Allergies    Review of Systems negative except from HPI and PMH  Physical Exam BP 110/74   Pulse 67   Ht 5\' 3"  (1.6 m)   Wt 123 lb (55.8 kg)   SpO2 96%   BMI 21.79 kg/m  Well developed and nourished in no acute distress HENT normal Neck supple with JVP-flat Clear Device pocket well healed; without hematoma or erythema.  There is no tethering  Regular rate and rhythm, no murmurs or gallops Abd-soft with active BS No Clubbing cyanosis edema Skin-warm and dry A & Oriented  Grossly normal sensory and motor function    ECG  Sinus with P-synchronous/ AV  pacing QRS neg Lead 1 and neg V1  Assessment and  Plan   Aborted cardiac Arrest  NICM  CRT-D St Jude The patient's device was interrogated.  The information was reviewed. No changes were made in the programming.     Hypertension  CHF chronic systolic  Euvolemic continue current meds  BP well controlled  EF remains high enough neither aldactone nor entresto indicated  Declines blood work

## 2018-10-17 NOTE — Patient Instructions (Signed)
Your physician recommends that you continue on your current medications as directed. Please refer to the Current Medication list given to you today.   Your physician wants you to follow-up in: 6 MONTHS WITH DR KLEIN You will receive a reminder letter in the mail two months in advance. If you don't receive a letter, please call our office to schedule the follow-up appointment.  

## 2019-05-04 ENCOUNTER — Other Ambulatory Visit: Payer: Self-pay | Admitting: Cardiovascular Disease

## 2019-05-05 MED ORDER — POTASSIUM CHLORIDE CRYS ER 20 MEQ PO TBCR: 20.0000 meq | EXTENDED_RELEASE_TABLET | Freq: Every day | ORAL | 3 refills | Status: DC

## 2019-05-05 NOTE — Addendum Note (Signed)
Addended by: Ernie Hew on: 05/05/2019 10:39 AM   Modules accepted: Orders

## 2019-07-18 ENCOUNTER — Telehealth: Payer: Self-pay | Admitting: Internal Medicine

## 2019-07-18 NOTE — Telephone Encounter (Signed)
   Spoke with patient and informed her we have not received a request from her dental office for her upcoming dental extraction surgery. Provided patient with our fax number to have a formal request sent over.   It appears she is overdue for a visit with Dr. Caryl Comes and has not had her device interrogated since 09/2018. Will route to Preop Callback to arrange a follow-up appointment, at which time her preoperative status can be addressed.  Abigail Butts, PA-C 07/18/19; 9:53 AM

## 2019-07-18 NOTE — Telephone Encounter (Signed)
Called patient and informed her that I was going to reach out to her dentist to see if they could fax over a medical clearance for patients upcoming dental procedure. She gave he her dentist name and number and thanked me for my call.   Contacted Dr Lupita Leash office and left a message requesting that they fax over a medical clearance request. Left our phone and fax number on their machine alone with patients info. Advised them to contact the office with any questions or concerns.   Contacted patient and made her aware that I did reach out to her dentist and I'm waiting to hear back. She voiced understanding.

## 2019-07-18 NOTE — Telephone Encounter (Signed)
New Message     Pt is calling about a medical clearance, she says she is suppose to have dental work done    Please call

## 2019-07-19 ENCOUNTER — Ambulatory Visit (INDEPENDENT_AMBULATORY_CARE_PROVIDER_SITE_OTHER): Payer: Medicaid Other | Admitting: Physician Assistant

## 2019-07-19 ENCOUNTER — Encounter: Payer: Self-pay | Admitting: Physician Assistant

## 2019-07-19 ENCOUNTER — Other Ambulatory Visit: Payer: Self-pay

## 2019-07-19 VITALS — BP 128/82 | HR 64 | Ht 63.0 in | Wt 135.8 lb

## 2019-07-19 DIAGNOSIS — Z950 Presence of cardiac pacemaker: Secondary | ICD-10-CM | POA: Diagnosis not present

## 2019-07-19 DIAGNOSIS — N182 Chronic kidney disease, stage 2 (mild): Secondary | ICD-10-CM | POA: Diagnosis not present

## 2019-07-19 DIAGNOSIS — I428 Other cardiomyopathies: Secondary | ICD-10-CM | POA: Diagnosis not present

## 2019-07-19 DIAGNOSIS — Z01810 Encounter for preprocedural cardiovascular examination: Secondary | ICD-10-CM | POA: Diagnosis not present

## 2019-07-19 NOTE — Telephone Encounter (Signed)
Patient called, states that her teeth are really bothering her and she needs to be seen ASAP so she can get the okay to do the surgery on her mouth- looked through schedule and offered first available, but patient was not happy waiting this amount of time. I suggested an appointment opening for today with Melina Copa, PA to see if this would help her. Also advised that we still had no clearance on file, but she states Dr.Jensen sent a message to Dr.Klein through epic- will route to MD to verify if in his basket, as well as to PA who I scheduled patient with today to make aware of information for visit. Patient aware to come alone, and to wear mask to appointment.        COVID-19 Pre-Screening Questions:  . In the past 7 to 10 days have you had a cough,  shortness of breath, headache, congestion, fever (100 or greater) body aches, chills, sore throat, or sudden loss of taste or sense of smell? NO . Have you been around anyone with known Covid 19? NO . Have you been around anyone who is awaiting Covid 19 test results in the past 7 to 10 days? NO . Have you been around anyone who has been exposed to Covid 19, or has mentioned symptoms of Covid 19 within the past 7 to 10 days? NO  If you have any concerns/questions about symptoms patients report during screening (either on the phone or at threshold). Contact the provider seeing the patient or DOD for further guidance.  If neither are available contact a member of the leadership team.

## 2019-07-19 NOTE — Progress Notes (Addendum)
Cardiology Office Note    Date:  07/19/2019   ID:  STEPHANINE BEK, DOB 1961-11-08, MRN [id number]  PCP:  [facility]  Cardiologist:  Verne Carrow, MD  Electrophysiologist:  Sherryl Manges, MD   Chief Complaint: pre-op clearance for teeth surgery  History of Present Illness:   Angela Payne is a 58 y.o. female with history of non-ischemic cardiomyopathy, prior VF arrest 09/06/2011BiV-ICD, PAF, CKD stage II by labs, prior tobacco/cocaine abuse here today for cardiac follow up. She was admitted to [facility] 2011/09/06after an out of hospital V. Fib arrest. She was cooled on the [place] sun protocol and underwent a cardiac cath during the admission in 09-06-11which showed no evidence of obstructive CAD. She had atrial fib in the setting of rescuscitation efforts during VF arrest but no clinical recurrence. Echo showed EF of 15%. Cardiac MRI in 2011 showed EF of 29%, no mention of infiltrative disease, no scar. Her cardiomyopathy was presumed to be non-ischemic, possibly from the history of cocaine abuse. She had an ICD implanted by Dr. Graciela Husbands in 2011/09/06with BiV-ICD gen change in January 2019. She has had chronic chest pain since her cardiac arrest. Last echo 06/2017 showed mild LVH, EF 40-45%, grade 2 DD, mild MR/TR, mild LAE.   The patient has refused routine Merlin monitoring and therefore per Dr. Graciela Husbands has been recommended to f/u every 6 months (last seen 9 months ago). She recently called in indicating she may need to have dental surgery soon to remove some broken teeth. She denies any interim changes in her health from last visit. She has chronic tenderness at device site but no anginal chest pain. She does not really exert herself much but is able to do ADLs without dyspnea. She does get fatigued/dyspneic with higher levels of activity but this is unchanged. We discussed titrating her medication regimen today (I.e. changing lisinopril to [facility],  eventually considering addition of spironolactone) but she is adamant she does not wish to make any changes today.  Last labs 11/2017 showed Hgb 12.6, K 4.1, Cr 1.15. She denies any recurrent tobacco or drug abuse. She uses ETOH once weekly.  Past Medical History:  Diagnosis Date  . Anemia, unspecified   . Atrial fibrillation (HCC)    a. This occurred in setting of resuscitation efforts during VF arrest.  . Automatic implantable cardiac defibrillator in situ    a. 05-Aug-2010. b. Gen chance in 10/2018.  . Cardiac arrest (HCC)    a. 2010/08/05  . Chronic systolic heart failure (HCC)   . CKD (chronic kidney disease), stage II   . Depression    ok  . GERD (gastroesophageal reflux disease)   . History of cocaine abuse (HCC)    remote  . History of tobacco abuse   . Hypertension   . ICD (implantable cardioverter-defibrillator) lead failure--noise of both leads 6/15 isolated 08/13/2015  . Nonischemic cardiomyopathy (HCC)    a. August 05, 2010 OOH VF arrest;  b. August 05, 2010 Cath: nonobs dzs, EF 15-20%, glob HK;  c. August 05, 2010 Echo: EF 15%, diff HK, Gr 2 DD;  d. Aug 05, 2010 Cardiac MRI EF of 29%, no scar;  e. Aug 05, 2010 s/p SJM 3231-40 Bi V ICD Ser # [id number].  Marland Kitchen Other left bundle branch block   . Paroxysmal supraventricular tachycardia (HCC)   . Thyroid disease     Past Surgical History:  Procedure Laterality Date  . BIV ICD GENERATOR CHANGEOUT N/A 12/29/2017  Procedure: BIV ICD GENERATOR CHANGEOUT;  Surgeon: Deboraha Sprang, MD;  Location: [facility];  Service: Cardiovascular;  Laterality: N/A;  . CARDIAC DEFIBRILLATOR PLACEMENT     St Jude Unify CD [id number] Q defib, serial number [id number]  . CARDIAC DEFIBRILLATOR PLACEMENT    . OPEN REDUCTION INTERNAL FIXATION (ORIF) DISTAL RADIAL FRACTURE Left 05/07/2014   Procedure: OPEN REDUCTION INTERNAL FIXATION (ORIF) DISTAL RADIAL FRACTURE;  Surgeon: Jolyn Nap, MD;  Location: [facility];  Service: Orthopedics;  Laterality: Left;    Current Medications: Current Meds  Medication  Sig  . albuterol (PROVENTIL HFA;VENTOLIN HFA) 108 (90 BASE) MCG/ACT inhaler Inhale 2 puffs into the lungs every 4 (four) hours as needed for wheezing or shortness of breath.   Marland Kitchen aspirin 81 MG chewable tablet Chew 81 mg by mouth daily.  . carvedilol (COREG) 25 MG tablet Take 1 tablet (25 mg total) by mouth 2 (two) times daily with a meal.  . diphenhydrAMINE (BENADRYL) 25 MG tablet Take 1 tablet (25 mg total) by mouth every 6 (six) hours as needed for itching (Rash).  . furosemide (LASIX) 40 MG tablet Take 1 tablet (40 mg total) by mouth daily.  . hydrocortisone cream 1 % Apply to affected area 2 times daily  . ibuprofen (ADVIL,MOTRIN) 200 MG tablet Take 200-400 mg by mouth every 6 (six) hours as needed for headache or mild pain.   Marland Kitchen lisinopril (PRINIVIL,ZESTRIL) 10 MG tablet Take 1 tablet (10 mg total) by mouth daily.  . methocarbamol (ROBAXIN) 500 MG tablet Take 500 mg by mouth daily as needed for muscle spasms.  . Multiple Vitamin (MULTIVITAMIN WITH MINERALS) TABS Take 1 tablet by mouth daily.  . naproxen (NAPROSYN) 500 MG tablet Take 500 mg by mouth 2 (two) times daily as needed for moderate pain.   . potassium chloride SA (KLOR-CON M20) 20 MEQ tablet Take 1 tablet (20 mEq total) by mouth daily.  . traMADol (ULTRAM) 50 MG tablet Take 1 tablet (50 mg total) by mouth every 6 (six) hours as needed. FOR PAIN     Allergies:   Patient has no known allergies.   Social History   Socioeconomic History  . Marital status: Married    Spouse name: Not on file  . Number of children: 4  . Years of education: Not on file  . Highest education level: Not on file  Occupational History  . Occupation: Disablity  Social Needs  . Financial resource strain: Not on file  . Food insecurity    Worry: Not on file    Inability: Not on file  . Transportation needs    Medical: Not on file    Non-medical: Not on file  Tobacco Use  . Smoking status: Former Smoker    Quit date: 07/28/2011    Years since  quitting: 7.9  . Smokeless tobacco: Never Used  Substance and Sexual Activity  . Alcohol use: No  . Drug use: No    Comment: history of cocaine abuse last used 2008  . Sexual activity: Yes    Birth control/protection: I.U.D.  Lifestyle  . Physical activity    Days per week: Not on file    Minutes per session: Not on file  . Stress: Not on file  Relationships  . Social Herbalist on phone: Not on file    Gets together: Not on file    Attends religious service: Not on file    Active member of club or organization: Not on  file    Attends meetings of clubs or organizations: Not on file    Relationship status: Not on file  Other Topics Concern  . Not on file  Social History Narrative  . Not on file     Family History:  The patient's family history includes Heart disease in her father and mother; Hypertension in her father and mother.  ROS:   Please see the history of present illness.  All other systems are reviewed and otherwise negative.    EKGs/Labs/Other Studies Reviewed:    Studies reviewed were summarized above.   EKG:  EKG is ordered today, personally reviewed, demonstrating NSR (V paced), right axis deviation, NSIVCD  Recent Labs: No results found for requested labs within last 8760 hours.  Recent Lipid Panel    Component Value Date/Time   CHOL 179 11/10/2012 1402   TRIG 97.0 11/10/2012 1402   HDL 34.80 (L) 11/10/2012 1402   CHOLHDL 5 11/10/2012 1402   VLDL 19.4 11/10/2012 1402   LDLCALC 125 (H) 11/10/2012 1402    PHYSICAL EXAM:    VS:  BP 128/82   Pulse 64   Ht 5\' 3"  (1.6 m)   Wt 135 lb 12.8 oz (61.6 kg)   SpO2 96%   BMI 24.06 kg/m   BMI: Body mass index is 24.06 kg/m.  GEN: Well nourished, well developed AAF, in no acute distress HEENT: normocephalic, atraumatic Neck: no JVD, carotid bruits, or masses Cardiac: RRR; no murmurs, rubs, or gallops, no edema  Respiratory:  clear to auscultation bilaterally, normal work of breathing GI:  soft, nontender, nondistended, + BS MS: no deformity or atrophy Skin: warm and dry, no rash. Left upper chest wall defibrillator site in tact without acute complication Neuro:  Alert and Oriented x 3, Strength and sensation are intact, follows commands Psych: euthymic mood, full affect  Wt Readings from Last 3 Encounters:  07/19/19 135 lb 12.8 oz (61.6 kg)  10/17/18 123 lb (55.8 kg)  09/08/18 121 lb 1.9 oz (54.9 kg)     ASSESSMENT & PLAN:   1. Pre-op cardiovascular examination - no interim change in cardiac status. Device interrogation shows no significant arrhythmias. She has no history of CAD. Revised cardiac risk index is 0.9% indicating low risk of CV events. She denies any new cardiac symptoms. Based on ACC/AHA guidelines, Angela Payne would be at acceptable risk for the planned procedure without further cardiovascular testing. She does not have a specific contraindication to holding ASA so if required, she can hold for shortest duration acceptable to dentist. I will ask MA to reach out to dentist's office to find out where they would like this note faxed to. 2. Nonischemic cardiomyopathy - appears euvolemic. As above, discussed med titration but she declines. She needs BMET today. She says she may need to leave and return to have it done. I encouraged her to give Sherryll Burgerntresto some thought. She'll let us know if she changes her mind. Reviewed 2g sodium restriction, 2L fluid restriction, daily weights with patient. 3. History of CRT-D - device interrogation stable. Will get her back in to see EP to re-establish routine interval monitoring. 4. CKD stage II - recheck BMET as soon as she can return for this. 5. Fatigue, unspecified - check CBC/TSH when she returns for labs.  Disposition: F/u with EP next available to re-establish routine interval follow-up, and Dr. Clifton JamesMcAlhany in 6 months. She declined to stay for her AVS to be printed.  Medication Adjustments/Labs and Tests Ordered: Current  medicines are reviewed at length with the patient today.  Concerns regarding medicines are outlined above. Medication changes, Labs and Tests ordered today are summarized above and listed in the Patient Instructions accessible in Encounters.   Signed, Laurann Montanaayna N Dunn, PA-C  07/19/2019 3:00 PM    [facility] [address], [place], [place]  [address] Phone: [phone]) [phone]; Fax: [phone]

## 2019-07-19 NOTE — Patient Instructions (Signed)
Medication Instructions:  Your physician recommends that you continue on your current medications as directed. Please refer to the Current Medication list given to you today.  If you need a refill on your cardiac medications before your next appointment, please call your pharmacy.   Lab work: 07/20/2019:  BMET, CBC, & TSH  If you have labs (blood work) drawn today and your tests are completely normal, you will receive your results only by: Marland Kitchen MyChart Message (if you have MyChart) OR . A paper copy in the mail If you have any lab test that is abnormal or we need to change your treatment, we will call you to review the results.  Testing/Procedures: None ordered  Follow-Up: At [facility], you and your health needs are our priority.  As part of our continuing mission to provide you with exceptional heart care, we have created designated Provider Care Teams.  These Care Teams include your primary Cardiologist (physician) and Advanced Practice Providers (APPs -  Physician Assistants and Nurse Practitioners) who all work together to provide you with the care you need, when you need it. You will need a follow up appointment in 6 months.  Please call our office 2 months in advance to schedule this appointment.  You may see Lauree Chandler, MD or one of the following Advanced Practice Providers on your designated Care Team:   [place], PA-C Melina Copa, PA-C . Ermalinda Barrios, PA-C  Your physician recommends that you schedule a follow-up appointment in: 1st available EP  Any Other Special Instructions Will Be Listed Below (If Applicable).

## 2019-07-19 NOTE — Telephone Encounter (Signed)
Pt cleared for teeth surgery in visit today. Please find out where to send today's office note and fax (not listed in this note, was initiated by patient). Thanks. Dayna Dunn PA-C

## 2019-07-19 NOTE — Telephone Encounter (Signed)
Contacted Dr. Lupita Leash office and have been advised to fax over progress note with given clearance to [phone].Marland Kitchen Have sent it via [employer].

## 2019-07-19 NOTE — Telephone Encounter (Signed)
Follow up    Patient is calling to inform Dr. Caryl Comes that Dr. Hoyt Koch has sent him a message in reference to her dental procedure. She is wanting someone to call her back as well to discuss.

## 2019-07-20 ENCOUNTER — Telehealth: Payer: Self-pay | Admitting: Internal Medicine

## 2019-07-20 ENCOUNTER — Other Ambulatory Visit: Payer: Medicaid Other

## 2019-07-20 NOTE — Telephone Encounter (Signed)
New message:     Patient calling asking has her medical clearance have been faxed to dental office.

## 2019-07-20 NOTE — Telephone Encounter (Signed)
Returned call to pt and she has been made aware that her clearance has been sent to Dr. Lupita Leash dentist office.  Pt thanked me for the return call.

## 2019-07-28 ENCOUNTER — Telehealth: Payer: Self-pay | Admitting: Physician Assistant

## 2019-07-28 NOTE — Telephone Encounter (Signed)
Returned pt's call.  Left a detailed message on what the appt 08/02/2019 was for and it was our recommendation that she keep it.  If she had further questions, to call the office back.

## 2019-07-28 NOTE — Telephone Encounter (Signed)
Patient would like to know what the upcoming appt on 9/2 is about.

## 2019-07-28 NOTE — Telephone Encounter (Signed)
Returned pts call and she has bee made aware of what her appt is about and she confirmed that she will be here.

## 2019-07-28 NOTE — Telephone Encounter (Signed)
Patient returning call.

## 2019-08-01 NOTE — Progress Notes (Deleted)
Cardiology Office Note Date:  08/01/2019  Patient ID:  Angela Payne, Angela Payne [birth year], MRN [id number] PCP:  [facility], [facility]  Cardiologist:  Der. McAlhany Electrophysiologist: Dr. Graciela Husbands  ***refresh   Chief Complaint: *** re-establish EP follow up  History of Present Illness: Angela Payne is a 58 y.o. female with history of VF arrest 2011, cath with no significant CAD, NICM, , neg cMRI, ICD implanted, (there was ? If arrest 2/2 cocaine), had AFib ibn the environment of her arrest none further has been noted, chronic CHF (systolic), CKD (II), HTN, no ongoing drug use is reported.  She comes in today to be seen for Dr. Graciela Husbands.  Most recently she was seen by D. Dunn, PA 07/19/19,  for clearance for dental work (cleared), she discussed optimizing her medicines though the patient felt well and wanted no changes made.  At that visit noted that she had historically declined remote monitoring and had been lost to f/u from her device checks (lloks like she missed one visit, likely 2/2 COVID)..  She last saw Dr. Graciela Husbands Nov 2019, he noted her LVEF remained improved, not felt need for aldactone or Entresto.  *** symptoms *** volume status *** meds, HF *** labs *** shocks  Device information: SJM CRT-D, implanted 07/18/10, gen change 12/29/17   Past Medical History:  Diagnosis Date   Anemia, unspecified    Atrial fibrillation (HCC)    a. This occurred in setting of resuscitation efforts during VF arrest.   Automatic implantable cardiac defibrillator in situ    a. July 31, 2010. b. Gen chance in 10/2018.   Cardiac arrest [facility])    a. 07/31/10   Chronic systolic heart failure (HCC)    CKD (chronic kidney disease), stage II    Depression    ok   GERD (gastroesophageal reflux disease)    History of cocaine abuse (HCC)    remote   History of tobacco abuse    Hypertension    ICD (implantable cardioverter-defibrillator) lead failure--noise of both leads 6/15 isolated  08/13/2015   Nonischemic cardiomyopathy (HCC)    a. 2010/07/31 OOH VF arrest;  b. 31-Jul-2010 Cath: nonobs dzs, EF 15-20%, glob HK;  c. 31-Jul-2010 Echo: EF 15%, diff HK, Gr 2 DD;  d. July 31, 2010 Cardiac MRI EF of 29%, no scar;  e. 2010-07-31 s/p SJM 3231-40 Bi V ICD Ser # [id number].   Other left bundle branch block    Paroxysmal supraventricular tachycardia (HCC)    Thyroid disease     Past Surgical History:  Procedure Laterality Date   BIV ICD GENERATOR CHANGEOUT N/A 12/29/2017   Procedure: BIV ICD GENERATOR CHANGEOUT;  Surgeon: Duke Salvia, MD;  Location: [facility] INVASIVE [facility];  Service: Cardiovascular;  Laterality: N/A;   CARDIAC DEFIBRILLATOR PLACEMENT     St Jude Unify CD [id number] Q defib, serial number [id number]   CARDIAC DEFIBRILLATOR PLACEMENT     OPEN REDUCTION INTERNAL FIXATION (ORIF) DISTAL RADIAL FRACTURE Left 05/07/2014   Procedure: OPEN REDUCTION INTERNAL FIXATION (ORIF) DISTAL RADIAL FRACTURE;  Surgeon: Jodi Marble, MD;  Location: [facility];  Service: Orthopedics;  Laterality: Left;    Current Outpatient Medications  Medication Sig Dispense Refill   albuterol (PROVENTIL HFA;VENTOLIN HFA) 108 (90 BASE) MCG/ACT inhaler Inhale 2 puffs into the lungs every 4 (four) hours as needed for wheezing or shortness of breath.      aspirin 81 MG chewable tablet Chew 81 mg by mouth daily.     carvedilol (COREG) 25 MG tablet  Take 1 tablet (25 mg total) by mouth 2 (two) times daily with a meal. 180 tablet 3   diphenhydrAMINE (BENADRYL) 25 MG tablet Take 1 tablet (25 mg total) by mouth every 6 (six) hours as needed for itching (Rash). 30 tablet 0   furosemide (LASIX) 40 MG tablet Take 1 tablet (40 mg total) by mouth daily. 90 tablet 2   hydrocortisone cream 1 % Apply to affected area 2 times daily 15 g 0   ibuprofen (ADVIL,MOTRIN) 200 MG tablet Take 200-400 mg by mouth every 6 (six) hours as needed for headache or mild pain.      lisinopril (PRINIVIL,ZESTRIL) 10 MG tablet Take 1 tablet (10 mg total) by mouth  daily. 30 tablet 11   methocarbamol (ROBAXIN) 500 MG tablet Take 500 mg by mouth daily as needed for muscle spasms.     Multiple Vitamin (MULTIVITAMIN WITH MINERALS) TABS Take 1 tablet by mouth daily.     naproxen (NAPROSYN) 500 MG tablet Take 500 mg by mouth 2 (two) times daily as needed for moderate pain.      potassium chloride SA (KLOR-CON M20) 20 MEQ tablet Take 1 tablet (20 mEq total) by mouth daily. 30 tablet 3   traMADol (ULTRAM) 50 MG tablet Take 1 tablet (50 mg total) by mouth every 6 (six) hours as needed. FOR PAIN 30 tablet 0   No current facility-administered medications for this visit.     Allergies:   Patient has no known allergies.   Social History:  The patient  reports that she quit smoking about 8 years ago. She has never used smokeless tobacco. She reports that she does not drink alcohol or use drugs.   Family History:  The patient's family history includes Heart disease in her father and mother; Hypertension in her father and mother.  ROS:  Please see the history of present illness.  All other systems are reviewed and otherwise negative.   PHYSICAL EXAM: *** VS:  There were no vitals taken for this visit. BMI: There is no height or weight on file to calculate BMI. Well nourished, well developed, in no acute distress  HEENT: normocephalic, atraumatic  Neck: no JVD, carotid bruits or masses Cardiac: *** RRR; no significant murmurs, no rubs, or gallops Lungs:  *** CTA b/l, no wheezing, rhonchi or rales  Abd: soft, nontender MS: no deformity or *** atrophy Ext: *** no edema  Skin: warm and dry, no rash Neuro:  No gross deficits appreciated Psych: euthymic mood, full affect  *** ICD site is stable, no tethering or discomfort   EKG:  Not done today ICD interrogation done today and reviewed by myself: ***   07/13/17: TTE Study Conclusions - Left ventricle: The cavity size was normal. Posterior wall   thickness was increased in a pattern of mild LVH.  Systolic   function was mildly to moderately reduced. The estimated ejection   fraction was in the range of 40% to 45%. Diffuse hypokinesis.   Features are consistent with a pseudonormal left ventricular   filling pattern, with concomitant abnormal relaxation and   increased filling pressure (grade 2 diastolic dysfunction).   Doppler parameters are consistent with indeterminate ventricular   filling pressure. - Mitral valve: Transvalvular velocity was within the normal range.   There was no evidence for stenosis. There was mild regurgitation. - Left atrium: The atrium was mildly dilated. - Right ventricle: The cavity size was normal. Wall thickness was   normal. Systolic function was normal. - Atrial  septum: No defect or patent foramen ovale was identified   by color flow Doppler. - Tricuspid valve: There was mild regurgitation. - Pulmonary arteries: Systolic pressure was within the normal   range. PA peak pressure: 23 mm Hg (S).    Recent Labs: No results found for requested labs within last 8760 hours.  No results found for requested labs within last 8760 hours.   CrCl cannot be calculated (Patient's most recent lab result is older than the maximum 21 days allowed.).   Wt Readings from Last 3 Encounters:  07/19/19 135 lb 12.8 oz (61.6 kg)  10/17/18 123 lb (55.8 kg)  09/08/18 121 lb 1.9 oz (54.9 kg)     Other studies reviewed: Additional studies/records reviewed today include: summarized above  ASSESSMENT AND PLAN:  1. CRT-D     ***  She just had cardiology visit 2. NICM     Last LVEF 40-45% 3. Chronic CHF     ***   Disposition: F/u with ***  Current medicines are reviewed at length with the patient today.  The patient did not have any concerns regarding medicines.***  Signed, Cade Standard, PA-C 08/01/2019 8:44 AM     [facility] [place] [id number] [phone] (office)  [phone] (fax)

## 2019-08-02 ENCOUNTER — Encounter: Payer: Self-pay | Admitting: Physician Assistant

## 2019-08-02 ENCOUNTER — Ambulatory Visit: Payer: Medicaid Other | Admitting: Physician Assistant

## 2019-08-10 NOTE — Progress Notes (Signed)
Electrophysiology Office Note Date: 08/11/2019  ID:  Angela Payne, DOB 12-27-1960, MRN [id number]  PCP: Care, Jinny Blossom Total Access Primary Cardiologist: Lauree Chandler, MD Electrophysiologist: None  CC: Routine ICD follow-up  Lind Payne Angela is Payne 58 y.o. female seen today for Dr. Caryl Comes. Last seen 09/2018. She presents today for routine electrophysiology follow up as encouraged by recent [facility] follow up for clearance for teeth surgery.  Since last being seen in our clinic, the patient reports doing very well. She denies chest pain, palpitations, dyspnea, PND, orthopnea, nausea, vomiting, dizziness, syncope, edema, weight gain, or early satiety.  She has not had ICD shocks.   Device History: St. Jude BiV ICD implanted originally 06/2010,  gen change 12/29/2017 for aborted cardiac arrest History of appropriate therapy: No History of AAD therapy: No   Past Medical History:  Diagnosis Date  . Anemia, unspecified   . Atrial fibrillation ([place])    Payne. This occurred in setting of resuscitation efforts during VF arrest.  . Automatic implantable cardiac defibrillator in situ    Payne. 06/2010. b. Gen chance in 10/2018.  . Cardiac arrest ([place])    Payne. 06/2010  . Chronic systolic heart failure ([place])   . CKD (chronic kidney disease), stage II   . Depression    ok  . GERD (gastroesophageal reflux disease)   . History of cocaine abuse ([place])    remote  . History of tobacco abuse   . Hypertension   . ICD (implantable cardioverter-defibrillator) lead failure--noise of both leads 6/15 isolated 08/13/2015  . Nonischemic cardiomyopathy ([place])    Payne. 06/2010 OOH VF arrest;  b. 06/2010 Cath: nonobs dzs, EF 15-20%, glob HK;  c. 06/2010 Echo: EF 15%, diff HK, Gr 2 DD;  d. 06/2010 Cardiac MRI EF of 29%, no scar;  e. 06/2010 s/p SJM 3231-40 Bi V ICD Ser # [id number].  Marland Kitchen Other left bundle branch block   . Paroxysmal supraventricular tachycardia ([place])   . Thyroid disease    Past Surgical History:  Procedure  Laterality Date  . BIV ICD GENERATOR CHANGEOUT N/Payne 12/29/2017   Procedure: BIV ICD GENERATOR CHANGEOUT;  Surgeon: Deboraha Sprang, MD;  Location: [place] [facility];  Service: Cardiovascular;  Laterality: N/Payne;  . CARDIAC DEFIBRILLATOR PLACEMENT     St Jude Unify CD [id number] Q defib, serial number [id number]  . CARDIAC DEFIBRILLATOR PLACEMENT    . OPEN REDUCTION INTERNAL FIXATION (ORIF) DISTAL RADIAL FRACTURE Left 05/07/2014   Procedure: OPEN REDUCTION INTERNAL FIXATION (ORIF) DISTAL RADIAL FRACTURE;  Surgeon: Jolyn Nap, MD;  Location: [place];  Service: Orthopedics;  Laterality: Left;    Current Outpatient Medications  Medication Sig Dispense Refill  . albuterol (PROVENTIL HFA;VENTOLIN HFA) 108 (90 BASE) MCG/ACT inhaler Inhale 2 puffs into the lungs every 4 (four) hours as needed for wheezing or shortness of breath.     Marland Kitchen aspirin 81 MG chewable tablet Chew 81 mg by mouth daily.    . carvedilol (COREG) 25 MG tablet Take 1 tablet (25 mg total) by mouth 2 (two) times daily with Payne meal. 180 tablet 3  . diphenhydrAMINE (BENADRYL) 25 MG tablet Take 1 tablet (25 mg total) by mouth every 6 (six) hours as needed for itching (Rash). 30 tablet 0  . furosemide (LASIX) 40 MG tablet Take 1 tablet (40 mg total) by mouth daily. 90 tablet 2  . hydrocortisone cream 1 % Apply to affected area 2 times daily 15 g 0  . ibuprofen (ADVIL,MOTRIN) 200  MG tablet Take 200-400 mg by mouth every 6 (six) hours as needed for headache or mild pain.     Marland Kitchen lisinopril (PRINIVIL,ZESTRIL) 10 MG tablet Take 1 tablet (10 mg total) by mouth daily. 30 tablet 11  . methocarbamol (ROBAXIN) 500 MG tablet Take 500 mg by mouth daily as needed for muscle spasms.    . Multiple Vitamin (MULTIVITAMIN WITH MINERALS) TABS Take 1 tablet by mouth daily.    . naproxen (NAPROSYN) 500 MG tablet Take 500 mg by mouth 2 (two) times daily as needed for moderate pain.     . potassium chloride SA (KLOR-CON M20) 20 MEQ tablet Take 1 tablet (20 mEq total) by  mouth daily. 30 tablet 3  . traMADol (ULTRAM) 50 MG tablet Take 1 tablet (50 mg total) by mouth every 6 (six) hours as needed. FOR PAIN 30 tablet 0   No current facility-administered medications for this visit.     Allergies:   Patient has no known allergies.   Social History: Social History   Socioeconomic History  . Marital status: Married    Spouse name: Not on file  . Number of children: 4  . Years of education: Not on file  . Highest education level: Not on file  Occupational History  . Occupation: Disablity  Social Needs  . Financial resource strain: Not on file  . Food insecurity    Worry: Not on file    Inability: Not on file  . Transportation needs    Medical: Not on file    Non-medical: Not on file  Tobacco Use  . Smoking status: Former Smoker    Quit date: 07/28/2011    Years since quitting: 8.0  . Smokeless tobacco: Never Used  Substance and Sexual Activity  . Alcohol use: No  . Drug use: No    Comment: history of cocaine abuse last used 2008  . Sexual activity: Yes    Birth control/protection: I.U.D.  Lifestyle  . Physical activity    Days per week: Not on file    Minutes per session: Not on file  . Stress: Not on file  Relationships  . Social Musician on phone: Not on file    Gets together: Not on file    Attends religious service: Not on file    Active member of club or organization: Not on file    Attends meetings of clubs or organizations: Not on file    Relationship status: Not on file  . Intimate partner violence    Fear of current or ex partner: Not on file    Emotionally abused: Not on file    Physically abused: Not on file    Forced sexual activity: Not on file  Other Topics Concern  . Not on file  Social History Narrative  . Not on file    Family History: Family History  Problem Relation Age of Onset  . Heart disease Father   . Hypertension Father   . Hypertension Mother   . Heart disease Mother     Review of  Systems: All other systems reviewed and are otherwise negative except as noted above.   Physical Exam: Vitals:   08/11/19 0911  Pulse: 95  Weight: 132 lb (59.9 kg)  Height: 5\' 3"  (1.6 m)    GEN- The patient is well appearing, alert and oriented x 3 today.   HEENT: normocephalic, atraumatic; sclera clear, conjunctiva pink; hearing intact; oropharynx clear; neck supple, no JVP Lymph- no  cervical lymphadenopathy Lungs- Clear to ausculation bilaterally, normal work of breathing.  No wheezes, rales, rhonchi Heart- Regular rate and rhythm, no murmurs, rubs or gallops, PMI not laterally displaced GI- soft, non-tender, non-distended, bowel sounds present, no hepatosplenomegaly Extremities- no clubbing, cyanosis, or edema; DP/PT/radial pulses 2+ bilaterally MS- no significant deformity or atrophy Skin- warm and dry, no rash or lesion; ICD pocket well healed Psych- euthymic mood, full affect Neuro- strength and sensation are intact  ICD interrogation- reviewed in detail today,  See PACEART report  EKG:  EKG is not ordered today.  Recent Labs: No results found for requested labs within last 8760 hours.   Wt Readings from Last 3 Encounters:  08/11/19 132 lb (59.9 kg)  07/19/19 135 lb 12.8 oz (61.6 kg)  10/17/18 123 lb (55.8 kg)     Other studies Reviewed: Additional studies/ records that were reviewed today include: Previous office notes.   Assessment and Plan:  1.  H/o aborted cardiac arrest s/p ICD implant  No ICD shocks.  euvolemic today Normal ICD function See Pace Art report No changes today  2. NICM Last echo 06/2017 LVEF 40-45%. Stable on appropriate medical regimen Poor candidate for spiro with h/o non-compliance  3. HTN Continue current medications.  Current medicines are reviewed at length with the patient today.   The patient does not have concerns regarding her medicines.  The following changes were made today:  none  Labs/ tests ordered today include:   Orders Placed This Encounter  Procedures  . CUP PACEART INCLINIC DEVICE CHECK     Disposition:   Follow up with Device clinic in 6 months and Dr. Graciela HusbandsKlein in 1 year. Pt refuses remote checks. Encouraged to call back if she changes her mind. She has Payne transmitter at home.   Dustin FlockSigned, Michael Andrew Tillery, PA-C  08/11/2019 9:32 AM  [facility] [address] [place] [address] [phone] (office) [phone] (fax)

## 2019-08-11 ENCOUNTER — Ambulatory Visit (INDEPENDENT_AMBULATORY_CARE_PROVIDER_SITE_OTHER): Payer: Medicaid Other | Admitting: Student

## 2019-08-11 DIAGNOSIS — I5022 Chronic systolic (congestive) heart failure: Secondary | ICD-10-CM

## 2019-08-11 LAB — CUP PACEART INCLINIC DEVICE CHECK
Battery Remaining Longevity: 55 mo
Brady Statistic RA Percent Paced: 3.7 %
Brady Statistic RV Percent Paced: 99 %
Date Time Interrogation Session: 20200911093745
HighPow Impedance: 44.0412
Implantable Lead Implant Date: 20110819
Implantable Lead Implant Date: 20110819
Implantable Lead Implant Date: 20110819
Implantable Lead Location: 753858
Implantable Lead Location: 753859
Implantable Lead Location: 753860
Implantable Lead Model: 7121
Implantable Pulse Generator Implant Date: 20190130
Lead Channel Impedance Value: 337.5 Ohm
Lead Channel Impedance Value: 575 Ohm
Lead Channel Impedance Value: 887.5 Ohm
Lead Channel Pacing Threshold Amplitude: 0.5 V
Lead Channel Pacing Threshold Amplitude: 0.5 V
Lead Channel Pacing Threshold Amplitude: 0.75 V
Lead Channel Pacing Threshold Amplitude: 0.75 V
Lead Channel Pacing Threshold Amplitude: 1.75 V
Lead Channel Pacing Threshold Amplitude: 1.75 V
Lead Channel Pacing Threshold Pulse Width: 0.5 ms
Lead Channel Pacing Threshold Pulse Width: 0.5 ms
Lead Channel Pacing Threshold Pulse Width: 0.5 ms
Lead Channel Pacing Threshold Pulse Width: 0.5 ms
Lead Channel Pacing Threshold Pulse Width: 0.6 ms
Lead Channel Pacing Threshold Pulse Width: 0.6 ms
Lead Channel Sensing Intrinsic Amplitude: 12 mV
Lead Channel Sensing Intrinsic Amplitude: 2.9 mV
Lead Channel Setting Pacing Amplitude: 2 V
Lead Channel Setting Pacing Amplitude: 2.5 V
Lead Channel Setting Pacing Amplitude: 2.75 V
Lead Channel Setting Pacing Pulse Width: 0.5 ms
Lead Channel Setting Pacing Pulse Width: 0.6 ms
Lead Channel Setting Sensing Sensitivity: 0.5 mV
Pulse Gen Serial Number: 9782720

## 2019-08-11 NOTE — Patient Instructions (Signed)
Medication Instructions:  Your physician recommends that you continue on your current medications as directed. Please refer to the Current Medication list given to you today.  If you need a refill on your cardiac medications before your next appointment, please call your pharmacy.   Lab work: NONE ORDERED  TODAY   If you have labs (blood work) drawn today and your tests are completely normal, you will receive your results only by: Marland Kitchen MyChart Message (if you have MyChart) OR . A paper copy in the mail If you have any lab test that is abnormal or we need to change your treatment, we will call you to review the results.  Testing/Procedures: NONE ORDERED  TODAY   Follow-Up: At [facility], you and your health needs are our priority.  As part of our continuing mission to provide you with exceptional heart care, we have created designated Provider Care Teams.  These Care Teams include your primary Cardiologist (physician) and Advanced Practice Providers (APPs -  Physician Assistants and Nurse Practitioners) who all work together to provide you with the care you need, when you need it. .  You will need a follow up appointment in 6 months.  Please call our office 2 months in advance to schedule this appointment.  You may see Dr. Caryl Comes  or one of the following Advanced Practice Providers on your designated Care Team:   Chanetta Marshall, NP . Electa Standard, PA-C . Joesph July PA-C   Your physician wants you to follow-up in:  IN  [place] will receive a reminder letter in the mail two months in advance. If you don't receive a letter, please call our office to schedule the follow-up appointment.     Any Other Special Instructions Will Be Listed Below (If Applicable).

## 2019-09-01 ENCOUNTER — Other Ambulatory Visit: Payer: Self-pay | Admitting: Cardiovascular Disease

## 2019-11-09 ENCOUNTER — Telehealth: Payer: Self-pay | Admitting: Cardiovascular Disease

## 2019-11-09 NOTE — Telephone Encounter (Signed)
New message    Patient calling to report  "buzz" in her foot, noticed on 12/9. Also reports pain neck for weeks. No other symptoms

## 2019-11-09 NOTE — Telephone Encounter (Signed)
Noticed buzzing starting yesterday at ankle area of right foot.  It is not constant. It is not like tingling like foot falling asleep.  She can't feel on outside of her foot but inside.   She is very concerned that her defibrillator is causing it.  Denies back problems but having neck issues.   Aware I am sending to device clinic for their input and assured her we will call her tomorrow with their input.

## 2019-11-10 NOTE — Telephone Encounter (Signed)
I can't imagine any way her device can cause buzzing at her feet. She does not have a paired home monitor. If she wants to come in for device clinic check, ok to make appointment at next available time.    Chanetta Marshall, NP 11/10/2019 8:15 AM

## 2019-11-10 NOTE — Telephone Encounter (Signed)
Patient informed of input from [employer].  She has found a neurologist she wants to see for the symptoms but would also like to come for a device check. I cancelled the appointment she made yesterday with Gerrianne Scale 12/11/18 and adv that I would send a message to scheduling to make her an appointment to come in device check.

## 2019-11-20 ENCOUNTER — Other Ambulatory Visit: Payer: Self-pay | Admitting: *Deleted

## 2019-11-20 MED ORDER — FUROSEMIDE 40 MG PO TABS: 40.0000 mg | ORAL_TABLET | Freq: Every day | ORAL | 0 refills | Status: DC

## 2019-11-30 ENCOUNTER — Other Ambulatory Visit: Payer: Self-pay | Admitting: Obstetrics & Gynecology

## 2019-11-30 DIAGNOSIS — Z1231 Encounter for screening mammogram for malignant neoplasm of breast: Secondary | ICD-10-CM

## 2019-12-05 ENCOUNTER — Ambulatory Visit: Payer: Medicaid Other

## 2019-12-12 ENCOUNTER — Ambulatory Visit: Payer: Medicaid Other | Admitting: Physician Assistant

## 2020-01-11 ENCOUNTER — Ambulatory Visit: Payer: Medicaid Other | Admitting: Cardiovascular Disease

## 2020-01-11 NOTE — Progress Notes (Deleted)
No chief complaint on file.  History of Present Illness: 59 yo female with history of non-ischemic cardiomyopathy, prior VF arrest August 2011, PAF here today for cardiac follow up. She was admitted to [facility] August 2011 after an out of hospital ventricular fibrillation arrest. No evidence of CAD by cath in 2011. Echo in 2011 showed LVEF=15%. Cardiac MRI in 2011 showed EF of 29%. Her cardiomyopathy was presumed to be non-ischemic, possibly from the history of cocaine abuse. She had an ICD implanted by Dr. Graciela Husbands on July 18, 2010. She has had chronic chest pain since her cardiac arrest. Echo August 2018 with LVEF=40-45%, grade 2 diastolic dysfunction, mild MR.  ICD generator change February 2019.   She is here today for follow up. The patient denies any chest pain, dyspnea, palpitations, lower extremity edema, orthopnea, PND, dizziness, near syncope or syncope.    Primary Care Physician: Care, Jovita Kussmaul Total Access  Past Medical History:  Diagnosis Date  . Anemia, unspecified   . Atrial fibrillation ([facility])    a. This occurred in setting of resuscitation efforts during VF arrest.  . Automatic implantable cardiac defibrillator in situ    a. 06/2010. b. Gen chance in 10/2018.  . Cardiac arrest ([facility])    a. 06/2010  . Chronic systolic heart failure ([facility])   . CKD (chronic kidney disease), stage II   . Depression    ok  . GERD (gastroesophageal reflux disease)   . History of cocaine abuse ([facility])    remote  . History of tobacco abuse   . Hypertension   . ICD (implantable cardioverter-defibrillator) lead failure--noise of both leads 6/15 isolated 08/13/2015  . Nonischemic cardiomyopathy ([facility])    a. 06/2010 OOH VF arrest;  b. 06/2010 Cath: nonobs dzs, EF 15-20%, glob HK;  c. 06/2010 Echo: EF 15%, diff HK, Gr 2 DD;  d. 06/2010 Cardiac MRI EF of 29%, no scar;  e. 06/2010 s/p SJM 3231-40 Bi V ICD Ser # [id number].  Marland Kitchen Other left bundle branch block   . Paroxysmal supraventricular tachycardia  ([facility])   . Thyroid disease     Past Surgical History:  Procedure Laterality Date  . BIV ICD GENERATOR CHANGEOUT N/A 12/29/2017   Procedure: BIV ICD GENERATOR CHANGEOUT;  Surgeon: Duke Salvia, MD;  Location: [facility] INVASIVE CV LAB;  Service: Cardiovascular;  Laterality: N/A;  . CARDIAC DEFIBRILLATOR PLACEMENT     St Jude Unify CD [id number] Q defib, serial number [id number]  . CARDIAC DEFIBRILLATOR PLACEMENT    . OPEN REDUCTION INTERNAL FIXATION (ORIF) DISTAL RADIAL FRACTURE Left 05/07/2014   Procedure: OPEN REDUCTION INTERNAL FIXATION (ORIF) DISTAL RADIAL FRACTURE;  Surgeon: Jodi Marble, MD;  Location: [facility];  Service: Orthopedics;  Laterality: Left;    Current Outpatient Medications  Medication Sig Dispense Refill  . albuterol (PROVENTIL HFA;VENTOLIN HFA) 108 (90 BASE) MCG/ACT inhaler Inhale 2 puffs into the lungs every 4 (four) hours as needed for wheezing or shortness of breath.     Marland Kitchen aspirin 81 MG chewable tablet Chew 81 mg by mouth daily.    . carvedilol (COREG) 25 MG tablet TAKE 1 TABLET (25 MG TOTAL) BY MOUTH 2 (TWO) TIMES DAILY WITH A MEAL. 180 tablet 3  . diphenhydrAMINE (BENADRYL) 25 MG tablet Take 1 tablet (25 mg total) by mouth every 6 (six) hours as needed for itching (Rash). 30 tablet 0  . furosemide (LASIX) 40 MG tablet Take 1 tablet (40 mg total) by mouth daily. 90 tablet 0  .  hydrocortisone cream 1 % Apply to affected area 2 times daily 15 g 0  . ibuprofen (ADVIL,MOTRIN) 200 MG tablet Take 200-400 mg by mouth every 6 (six) hours as needed for headache or mild pain.     Marland Kitchen lisinopril (PRINIVIL,ZESTRIL) 10 MG tablet Take 1 tablet (10 mg total) by mouth daily. 30 tablet 11  . methocarbamol (ROBAXIN) 500 MG tablet Take 500 mg by mouth daily as needed for muscle spasms.    . Multiple Vitamin (MULTIVITAMIN WITH MINERALS) TABS Take 1 tablet by mouth daily.    . naproxen (NAPROSYN) 500 MG tablet Take 500 mg by mouth 2 (two) times daily as needed for moderate pain.     . potassium chloride  SA (KLOR-CON M20) 20 MEQ tablet Take 1 tablet (20 mEq total) by mouth daily. 30 tablet 3  . traMADol (ULTRAM) 50 MG tablet Take 1 tablet (50 mg total) by mouth every 6 (six) hours as needed. FOR PAIN 30 tablet 0   No current facility-administered medications for this visit.    No Known Allergies  Social History   Socioeconomic History  . Marital status: Married    Spouse name: Not on file  . Number of children: 4  . Years of education: Not on file  . Highest education level: Not on file  Occupational History  . Occupation: Disablity  Tobacco Use  . Smoking status: Former Smoker    Quit date: 07/28/2011    Years since quitting: 8.4  . Smokeless tobacco: Never Used  Substance and Sexual Activity  . Alcohol use: No  . Drug use: No    Comment: history of cocaine abuse last used 2008  . Sexual activity: Yes    Birth control/protection: I.U.D.  Other Topics Concern  . Not on file  Social History Narrative  . Not on file   Social Determinants of Health   Financial Resource Strain:   . Difficulty of Paying Living Expenses: Not on file  Food Insecurity:   . Worried About Charity fundraiser in the Last Year: Not on file  . Ran Out of Food in the Last Year: Not on file  Transportation Needs:   . Lack of Transportation (Medical): Not on file  . Lack of Transportation (Non-Medical): Not on file  Physical Activity:   . Days of Exercise per Week: Not on file  . Minutes of Exercise per Session: Not on file  Stress:   . Feeling of Stress : Not on file  Social Connections:   . Frequency of Communication with Friends and Family: Not on file  . Frequency of Social Gatherings with Friends and Family: Not on file  . Attends Religious Services: Not on file  . Active Member of Clubs or Organizations: Not on file  . Attends Archivist Meetings: Not on file  . Marital Status: Not on file  Intimate Partner Violence:   . Fear of Current or Ex-Partner: Not on file  .  Emotionally Abused: Not on file  . Physically Abused: Not on file  . Sexually Abused: Not on file    Family History  Problem Relation Age of Onset  . Heart disease Father   . Hypertension Father   . Hypertension Mother   . Heart disease Mother     Review of Systems:  As stated in the HPI and otherwise negative.   There were no vitals taken for this visit.  Physical Examination:  General: Well developed, well nourished, NAD  HEENT: OP  clear, mucus membranes moist  SKIN: warm, dry. No rashes. Neuro: No focal deficits  Musculoskeletal: Muscle strength 5/5 all ext  Psychiatric: Mood and affect normal  Neck: No JVD, no carotid bruits, no thyromegaly, no lymphadenopathy.  Lungs:Clear bilaterally, no wheezes, rhonci, crackles Cardiovascular: Regular rate and rhythm. No murmurs, gallops or rubs. Abdomen:Soft. Bowel sounds present. Non-tender.  Extremities: No lower extremity edema. Pulses are 2 + in the bilateral DP/PT.  Echo August 2018: Left ventricle: The cavity size was normal. Posterior wall   thickness was increased in a pattern of mild LVH. Systolic   function was mildly to moderately reduced. The estimated ejection   fraction was in the range of 40% to 45%. Diffuse hypokinesis.   Features are consistent with a pseudonormal left ventricular   filling pattern, with concomitant abnormal relaxation and   increased filling pressure (grade 2 diastolic dysfunction).   Doppler parameters are consistent with indeterminate ventricular   filling pressure. - Mitral valve: Transvalvular velocity was within the normal range.   There was no evidence for stenosis. There was mild regurgitation. - Left atrium: The atrium was mildly dilated. - Right ventricle: The cavity size was normal. Wall thickness was   normal. Systolic function was normal. - Atrial septum: No defect or patent foramen ovale was identified   by color flow Doppler. - Tricuspid valve: There was mild regurgitation. -  Pulmonary arteries: Systolic pressure was within the normal   range. PA peak pressure: 23 mm Hg (S).  EKG:  EKG is not *** ordered today.  The EKG demonstrates   Recent Labs: No results found for requested labs within last 8760 hours.   Lipid Panel    Component Value Date/Time   CHOL 179 11/10/2012 1402   TRIG 97.0 11/10/2012 1402   HDL 34.80 (L) 11/10/2012 1402   CHOLHDL 5 11/10/2012 1402   VLDL 19.4 11/10/2012 1402   LDLCALC 125 (H) 11/10/2012 1402     Wt Readings from Last 3 Encounters:  08/11/19 132 lb (59.9 kg)  07/19/19 135 lb 12.8 oz (61.6 kg)  10/17/18 123 lb (55.8 kg)     Other studies Reviewed: Additional studies/ records that were reviewed today include: ED records   Assessment and Plan:   1. Non ischemic cardiomyopathy: LVEF=40% by echo in 2018. ICD in place. Continue Coreg and Lisinopril. We could consider Entresto but she does not wish to change at this time. She is not felt to be a good candidate for aldactone given history of non-compliance.   2. Chronic systolic CHF: Weight is stable. Volume status is stable. Continue Lasix.    3. Atrial fib, paroxysmal: She is in sinus today. Remote atrial fibrillation  4. Substance abuse: In remission  Current medicines are reviewed at length with the patient today.  The patient does not have concerns regarding medicines.  The following changes have been made:  no change  Labs/ tests ordered today include: echo  No orders of the defined types were placed in this encounter.   Disposition:   FU with me in 12 months  Signed, Verne Carrow, MD 01/11/2020 8:07 AM    [facility] [address] [place], [place], [place]  [address] Phone: [phone]; Fax: [phone]

## 2020-02-14 ENCOUNTER — Other Ambulatory Visit: Payer: Self-pay

## 2020-02-14 ENCOUNTER — Ambulatory Visit (INDEPENDENT_AMBULATORY_CARE_PROVIDER_SITE_OTHER): Payer: Medicaid Other | Admitting: Cardiovascular Disease

## 2020-02-14 ENCOUNTER — Encounter: Payer: Self-pay | Admitting: Cardiovascular Disease

## 2020-02-14 VITALS — BP 130/80 | HR 81 | Ht 63.0 in | Wt 141.0 lb

## 2020-02-14 DIAGNOSIS — I5022 Chronic systolic (congestive) heart failure: Secondary | ICD-10-CM

## 2020-02-14 DIAGNOSIS — I480 Paroxysmal atrial fibrillation: Secondary | ICD-10-CM

## 2020-02-14 DIAGNOSIS — I428 Other cardiomyopathies: Secondary | ICD-10-CM

## 2020-02-14 NOTE — Patient Instructions (Signed)
Medication Instructions:  No changes *If you need a refill on your cardiac medications before your next appointment, please call your pharmacy*   Lab Work: none If you have labs (blood work) drawn today and your tests are completely normal, you will receive your results only by: . MyChart Message (if you have MyChart) OR . A paper copy in the mail If you have any lab test that is abnormal or we need to change your treatment, we will call you to review the results.   Testing/Procedures: none   Follow-Up: At [facility], you and your health needs are our priority.  As part of our continuing mission to provide you with exceptional heart care, we have created designated Provider Care Teams.  These Care Teams include your primary Cardiologist (physician) and Advanced Practice Providers (APPs -  Physician Assistants and Nurse Practitioners) who all work together to provide you with the care you need, when you need it.  We recommend signing up for the patient portal called "MyChart".  Sign up information is provided on this After Visit Summary.  MyChart is used to connect with patients for Virtual Visits (Telemedicine).  Patients are able to view lab/test results, encounter notes, upcoming appointments, etc.  Non-urgent messages can be sent to your provider as well.   To learn more about what you can do with MyChart, go to [email or web address].com.    Your next appointment:   12 month(s)  The format for your next appointment:   Either In Person or Virtual  Provider:   You may see Christopher McAlhany, MD or one of the following Advanced Practice Providers on your designated Care Team:    Dayna Dunn, PA-C  Michele Lenze, PA-C   Other Instructions   

## 2020-02-14 NOTE — Progress Notes (Signed)
Chief Complaint  Patient presents with  . Follow-up    NICM   History of Present Illness: 59 yo female with history of non-ischemic cardiomyopathy, prior VF arrest August 2011, PAF here today for cardiac follow up. She was admitted to [facility] August 2011 after an out of hospital ventricular fibrillation arrest. No evidence of CAD by cath in 2011. Echo in 2011 showed LVEF=15%. Cardiac MRI in 2011 showed EF of 29%. Her cardiomyopathy was presumed to be non-ischemic, possibly from the history of cocaine abuse. She had an ICD implanted by Dr. Graciela Husbands on July 18, 2010. She has had chronic chest pain since her cardiac arrest. Echo August 2018 with LVEF=40-45%, grade 2 diastolic dysfunction, mild MR.  ICD generator change February 2019. She has refused to consider Entresto.   She is here today for follow up. The patient denies any chest pain, dyspnea, palpitations, lower extremity edema, orthopnea, PND, dizziness, near syncope or syncope.    Primary Care Physician: Care, Jovita Kussmaul [facility]  Past Medical History:  Diagnosis Date  . Anemia, unspecified   . Atrial fibrillation (HCC)    a. This occurred in setting of resuscitation efforts during VF arrest.  . Automatic implantable cardiac defibrillator in situ    a. 06/2010. b. Gen chance in 10/2018.  . Cardiac arrest (HCC)    a. 06/2010  . Chronic systolic heart failure (HCC)   . CKD (chronic kidney disease), stage II   . Depression    ok  . GERD (gastroesophageal reflux disease)   . History of cocaine abuse (HCC)    remote  . History of tobacco abuse   . Hypertension   . ICD (implantable cardioverter-defibrillator) lead failure--noise of both leads 6/15 isolated 08/13/2015  . Nonischemic cardiomyopathy (HCC)    a. 06/2010 OOH VF arrest;  b. 06/2010 Cath: nonobs dzs, EF 15-20%, glob HK;  c. 06/2010 Echo: EF 15%, diff HK, Gr 2 DD;  d. 06/2010 Cardiac MRI EF of 29%, no scar;  e. 06/2010 s/p SJM 3231-40 Bi V ICD Ser # [id number].  Marland Kitchen  Other left bundle branch block   . Paroxysmal supraventricular tachycardia (HCC)   . Thyroid disease     Past Surgical History:  Procedure Laterality Date  . BIV ICD GENERATOR CHANGEOUT N/A 12/29/2017   Procedure: BIV ICD GENERATOR CHANGEOUT;  Surgeon: Duke Salvia, MD;  Location: [facility] INVASIVE CV LAB;  Service: Cardiovascular;  Laterality: N/A;  . CARDIAC DEFIBRILLATOR PLACEMENT     St Jude Unify CD [id number] Q defib, serial number [id number]  . CARDIAC DEFIBRILLATOR PLACEMENT    . OPEN REDUCTION INTERNAL FIXATION (ORIF) DISTAL RADIAL FRACTURE Left 05/07/2014   Procedure: OPEN REDUCTION INTERNAL FIXATION (ORIF) DISTAL RADIAL FRACTURE;  Surgeon: Jodi Marble, MD;  Location: [facility] OR;  Service: Orthopedics;  Laterality: Left;    Current Outpatient Medications  Medication Sig Dispense Refill  . albuterol (PROVENTIL HFA;VENTOLIN HFA) 108 (90 BASE) MCG/ACT inhaler Inhale 2 puffs into the lungs every 4 (four) hours as needed for wheezing or shortness of breath.     Marland Kitchen aspirin 81 MG chewable tablet Chew 81 mg by mouth daily.    . carvedilol (COREG) 25 MG tablet TAKE 1 TABLET (25 MG TOTAL) BY MOUTH 2 (TWO) TIMES DAILY WITH A MEAL. 180 tablet 3  . diphenhydrAMINE (BENADRYL) 25 MG tablet Take 1 tablet (25 mg total) by mouth every 6 (six) hours as needed for itching (Rash). 30 tablet 0  . furosemide (LASIX) 40 MG  tablet Take 1 tablet (40 mg total) by mouth daily. 90 tablet 0  . hydrocortisone cream 1 % Apply to affected area 2 times daily 15 g 0  . ibuprofen (ADVIL,MOTRIN) 200 MG tablet Take 200-400 mg by mouth every 6 (six) hours as needed for headache or mild pain.     Marland Kitchen lisinopril (PRINIVIL,ZESTRIL) 10 MG tablet Take 1 tablet (10 mg total) by mouth daily. 30 tablet 11  . methocarbamol (ROBAXIN) 500 MG tablet Take 500 mg by mouth daily as needed for muscle spasms.    . Multiple Vitamin (MULTIVITAMIN WITH MINERALS) TABS Take 1 tablet by mouth daily.    . naproxen (NAPROSYN) 500 MG tablet Take 500 mg by  mouth 2 (two) times daily as needed for moderate pain.     . potassium chloride SA (KLOR-CON M20) 20 MEQ tablet Take 1 tablet (20 mEq total) by mouth daily. 30 tablet 3  . traMADol (ULTRAM) 50 MG tablet Take 1 tablet (50 mg total) by mouth every 6 (six) hours as needed. FOR PAIN 30 tablet 0   No current facility-administered medications for this visit.    No Known Allergies  Social History   Socioeconomic History  . Marital status: Married    Spouse name: Not on file  . Number of children: 4  . Years of education: Not on file  . Highest education level: Not on file  Occupational History  . Occupation: Disablity  Tobacco Use  . Smoking status: Former Smoker    Quit date: 07/28/2011    Years since quitting: 8.5  . Smokeless tobacco: Never Used  Substance and Sexual Activity  . Alcohol use: No  . Drug use: No    Comment: history of cocaine abuse last used 2008  . Sexual activity: Yes    Birth control/protection: I.U.D.  Other Topics Concern  . Not on file  Social History Narrative  . Not on file   Social Determinants of Health   Financial Resource Strain:   . Difficulty of Paying Living Expenses:   Food Insecurity:   . Worried About Charity fundraiser in the Last Year:   . Arboriculturist in the Last Year:   Transportation Needs:   . Film/video editor (Medical):   Marland Kitchen Lack of Transportation (Non-Medical):   Physical Activity:   . Days of Exercise per Week:   . Minutes of Exercise per Session:   Stress:   . Feeling of Stress :   Social Connections:   . Frequency of Communication with Friends and Family:   . Frequency of Social Gatherings with Friends and Family:   . Attends Religious Services:   . Active Member of Clubs or Organizations:   . Attends Archivist Meetings:   Marland Kitchen Marital Status:   Intimate Partner Violence:   . Fear of Current or Ex-Partner:   . Emotionally Abused:   Marland Kitchen Physically Abused:   . Sexually Abused:     Family History    Problem Relation Age of Onset  . Heart disease Father   . Hypertension Father   . Hypertension Mother   . Heart disease Mother     Review of Systems:  As stated in the HPI and otherwise negative.   BP 130/80   Pulse 81   Ht 5\' 3"  (1.6 m)   Wt 141 lb (64 kg)   SpO2 97%   BMI 24.98 kg/m   Physical Examination:  General: Well developed, well nourished, NAD  HEENT: OP clear, mucus membranes moist  SKIN: warm, dry. No rashes. Neuro: No focal deficits  Musculoskeletal: Muscle strength 5/5 all ext  Psychiatric: Mood and affect normal  Neck: No JVD, no carotid bruits, no thyromegaly, no lymphadenopathy.  Lungs:Clear bilaterally, no wheezes, rhonci, crackles Cardiovascular: Regular rate and rhythm. No murmurs, gallops or rubs. Abdomen:Soft. Bowel sounds present. Non-tender.  Extremities: No lower extremity edema. Pulses are 2 + in the bilateral DP/PT.  Echo August 2018: Left ventricle: The cavity size was normal. Posterior wall   thickness was increased in a pattern of mild LVH. Systolic   function was mildly to moderately reduced. The estimated ejection   fraction was in the range of 40% to 45%. Diffuse hypokinesis.   Features are consistent with a pseudonormal left ventricular   filling pattern, with concomitant abnormal relaxation and   increased filling pressure (grade 2 diastolic dysfunction).   Doppler parameters are consistent with indeterminate ventricular   filling pressure. - Mitral valve: Transvalvular velocity was within the normal range.   There was no evidence for stenosis. There was mild regurgitation. - Left atrium: The atrium was mildly dilated. - Right ventricle: The cavity size was normal. Wall thickness was   normal. Systolic function was normal. - Atrial septum: No defect or patent foramen ovale was identified   by color flow Doppler. - Tricuspid valve: There was mild regurgitation. - Pulmonary arteries: Systolic pressure was within the normal   range.  PA peak pressure: 23 mm Hg (S).  EKG:  EKG is not ordered today.  The EKG demonstrates   Recent Labs: No results found for requested labs within last 8760 hours.   Lipid Panel    Component Value Date/Time   CHOL 179 11/10/2012 1402   TRIG 97.0 11/10/2012 1402   HDL 34.80 (L) 11/10/2012 1402   CHOLHDL 5 11/10/2012 1402   VLDL 19.4 11/10/2012 1402   LDLCALC 125 (H) 11/10/2012 1402     Wt Readings from Last 3 Encounters:  02/14/20 141 lb (64 kg)  08/11/19 132 lb (59.9 kg)  07/19/19 135 lb 12.8 oz (61.6 kg)     Other studies Reviewed: Additional studies/ records that were reviewed today include: ED records   Assessment and Plan:   1. Non ischemic cardiomyopathy: LVEF=40% by echo in 2018. ICD in place. Continue Coreg and Lisinopril. We discussed adding Entresto but she has not wished to make this change.   Not a candidate for aldactone with poor compliance over the years.   2. Chronic systolic CHF: Weight is stable. No volume overload on exam. Continue Lasix.    3. Atrial fib, paroxysmal: She is in sinus today.   4. Substance abuse: In remission  Current medicines are reviewed at length with the patient today.  The patient does not have concerns regarding medicines.  The following changes have been made:  no change  Labs/ tests ordered today include: echo  No orders of the defined types were placed in this encounter.   Disposition:   FU with me in 12 months  Signed, Verne Carrow, MD 02/14/2020 12:28 PM    [facility] [address] [place], [place], [place]  [address] Phone: [phone]; Fax: [phone]

## 2020-03-10 ENCOUNTER — Other Ambulatory Visit: Payer: Self-pay | Admitting: Cardiovascular Disease

## 2020-04-16 ENCOUNTER — Other Ambulatory Visit: Payer: Self-pay | Admitting: Cardiovascular Disease

## 2020-04-16 MED ORDER — POTASSIUM CHLORIDE CRYS ER 20 MEQ PO TBCR: 20.0000 meq | EXTENDED_RELEASE_TABLET | Freq: Every day | ORAL | 3 refills | Status: DC

## 2020-04-25 ENCOUNTER — Telehealth: Payer: Self-pay | Admitting: Cardiovascular Disease

## 2020-04-25 NOTE — Telephone Encounter (Signed)
New message  Patient states that she is having issues with her device. States that when she is in the bathroom and the fan cuts on, it causes the defibrillator to vibrate and makes her feel funny. Please give patient a call back to discuss. States that she would like to speak with Dr. Gibson Ramp nurse.

## 2020-04-25 NOTE — Telephone Encounter (Signed)
Patient reports that when she turns on her bathroom fan it causes her ICD to " vibrate  In her chest". She reports she has the same feeling when the alarm in the hallway of her apartment goes off. She reports intermittent CP and SOB that is her baseline for the past 11 years. No syncope or dizziness.She declines remote monitoring therefore a remote transmission is not possible. Scheduled patient for in clinic device check on 04/30/20. Patient given ED precautions for CP, SOB, syncope and palpitations.

## 2020-04-30 ENCOUNTER — Ambulatory Visit (INDEPENDENT_AMBULATORY_CARE_PROVIDER_SITE_OTHER): Payer: Medicaid Other | Admitting: Emergency Medicine

## 2020-04-30 ENCOUNTER — Other Ambulatory Visit: Payer: Self-pay

## 2020-04-30 DIAGNOSIS — I4901 Ventricular fibrillation: Secondary | ICD-10-CM | POA: Diagnosis not present

## 2020-04-30 DIAGNOSIS — Z95810 Presence of automatic (implantable) cardiac defibrillator: Secondary | ICD-10-CM

## 2020-05-01 ENCOUNTER — Telehealth: Payer: Self-pay | Admitting: Cardiovascular Disease

## 2020-05-01 LAB — CUP PACEART INCLINIC DEVICE CHECK
Battery Remaining Longevity: 45 mo
Brady Statistic RA Percent Paced: 2.7 %
Brady Statistic RV Percent Paced: 98 %
Date Time Interrogation Session: 20210601113100
HighPow Impedance: 42.8589
Implantable Lead Implant Date: 20110819
Implantable Lead Implant Date: 20110819
Implantable Lead Implant Date: 20110819
Implantable Lead Location: 753858
Implantable Lead Location: 753859
Implantable Lead Location: 753860
Implantable Lead Model: 7121
Implantable Pulse Generator Implant Date: 20190130
Lead Channel Impedance Value: 287.5 Ohm
Lead Channel Impedance Value: 512.5 Ohm
Lead Channel Impedance Value: 775 Ohm
Lead Channel Pacing Threshold Amplitude: 0.5 V
Lead Channel Pacing Threshold Amplitude: 0.5 V
Lead Channel Pacing Threshold Amplitude: 1.75 V
Lead Channel Pacing Threshold Pulse Width: 0.5 ms
Lead Channel Pacing Threshold Pulse Width: 0.5 ms
Lead Channel Pacing Threshold Pulse Width: 0.6 ms
Lead Channel Sensing Intrinsic Amplitude: 12 mV
Lead Channel Sensing Intrinsic Amplitude: 2.4 mV
Lead Channel Setting Pacing Amplitude: 2 V
Lead Channel Setting Pacing Amplitude: 2.5 V
Lead Channel Setting Pacing Amplitude: 2.75 V
Lead Channel Setting Pacing Pulse Width: 0.5 ms
Lead Channel Setting Pacing Pulse Width: 0.6 ms
Lead Channel Setting Sensing Sensitivity: 0.5 mV
Pulse Gen Serial Number: 9782720

## 2020-05-01 NOTE — Telephone Encounter (Signed)
I spoke with patient. She reports the fan in her apartment bothers her and causes her defibrillator to buzz in her chest.  She reports increased heart rate when this occurs.  Was recently seen in device clinic regarding this and patient reports everything checked out OK. She reports her apartment can disconnect fan but this requires a letter from her doctor.  She states letter from her apartment complex has been faxed to office. I told patient I would check to see if fax has been received and if this is something Dr [name] could complete.

## 2020-05-01 NOTE — Telephone Encounter (Signed)
Patient states she is returning a call, however she does not know who called or what the call may have been in regards to. No notes are available. She is requesting to speak with Angela Payne. Please call.

## 2020-05-01 NOTE — Progress Notes (Signed)
CRT-D device check in office. Patient had concerns that bathroom fan and alarm system at her apartment was affecting her device function because she has a "funny feeling " in her chest when she encounters the operation of both the overhead fan and when the alarm sounds.Thresholds and sensing consistent with previous device measurements. Lead impedance trends stable over time. AT/AF burden < 1%, 345 AMS episodes with 15 available EGMs that show false AF r/t atrial noise which is known issue and AT with longest episode lasting 4 minutes and 40 seconds . No ventricular arrhythmia episodes recorded. Patient bi-ventricularly pacing 98% of the time. Device programmed with appropriate safety margins. Heart failure diagnostics reviewed and trends are stable for patient. No changes made this session. Estimated longevity 3 yrs 9 month.  Patient enrolled in remote follow up. Plan to check device remotely in 3 months and patient will be scheduled for f/u with Dr Graciela Husbands in 3 months. Patient education completed including shock plan. Attempted to reassure patient that device function is stable. She insists there is something affecting her heart function and requested letter for housing office that fan and alarm are causing issues with her device function and heart. Informed that any letter needs to come from MD and as device function appears WNL then will forward to primary cardiology.

## 2020-05-06 NOTE — Telephone Encounter (Signed)
Form from [facility] received.   "Letter to [employer] for a Reasonable Accomodation Request".   Will place in Dr. Odessa Fleming mailbox for review and follow up on patient's request.  The reason stated by patient is that the exhaust fan causes defibrillator to react and sometimes can cause a sense of vibration in chest.

## 2020-05-10 NOTE — Telephone Encounter (Signed)
TO whom it may concern: Angela Payne is a patient at [facility];  she has an implanted defibrillator.  It is her impression that the fan in her apartment interacts and causes problems with her heart and her defibrillator.  If it would be possible to inactivate the fan as she has requested, we would be appreciative of your help for our patient Sincerely  Sherryl Manges

## 2020-05-10 NOTE — Telephone Encounter (Signed)
But in point of fact, I cant imagine an interaction between her fan and her ICD

## 2020-05-10 NOTE — Telephone Encounter (Signed)
Completed from for reasonable accomodation from [facility] and placed with medical records to be faxed along with Dr Odessa Fleming letter as below.

## 2020-07-17 ENCOUNTER — Encounter: Payer: Self-pay | Admitting: Family Medicine

## 2020-07-17 ENCOUNTER — Ambulatory Visit: Payer: Medicaid Other | Admitting: Family Medicine

## 2020-07-17 NOTE — Progress Notes (Signed)
Patient did not keep appointment today. She may call to reschedule.  

## 2020-08-12 ENCOUNTER — Encounter: Payer: Medicaid Other | Admitting: Internal Medicine

## 2020-08-26 ENCOUNTER — Telehealth: Payer: Self-pay | Admitting: Cardiovascular Disease

## 2020-08-26 NOTE — Telephone Encounter (Signed)
New message  Pt c/o of Chest Pain: STAT if CP now or developed within 24 hours  1. Are you having CP right now? No   2. Are you experiencing any other symptoms (ex. SOB, nausea, vomiting, sweating)? no  3. How long have you been experiencing CP? For the last 3 days   4. Is your CP continuous or coming and going? It is coming and going  5. Have you taken Nitroglycerin? No   Pt called stated that this started 3 days ago.  It is some tight and fullness in her chest , she is not having it now but it does come and go.  Please advise   Pt stated if she has to come in to be seen she would like to come in the morning  [phone]- best cb number  ?

## 2020-08-26 NOTE — Telephone Encounter (Signed)
Heaviness/fullness on chest all day.   Drinking cold water makes it better.  Threw up 3 times the other day- lot of fluid and a lot of food.  Felt better after this.   Feels like something is stuck in her chest and can feel sob w this.  No nausea.    Pt has a GI doctor but wanted to make sure this is not her heart.  I adv her to reach out to GI or to PCP for evaluation.  She will call them today.

## 2020-09-27 ENCOUNTER — Telehealth: Payer: Self-pay | Admitting: Cardiovascular Disease

## 2020-09-27 NOTE — Telephone Encounter (Signed)
Let's get her in to see me or an APP on the care team next week.

## 2020-09-27 NOTE — Telephone Encounter (Signed)
Called patient. She is continuing to have some chest tightness and fatigue. Is not able to connect with any certain activities.    Just doesn't feel right.  She is asking for an echocardiogram.  Her last echo was 2018.  She has follow up with EP 11/11 but not due to see Dr. [name] until March 2021.  Will forward to Dr. [name] to see he would like to get echo.  If not, we will arrange sooner follow up with an APP on care team.

## 2020-09-27 NOTE — Telephone Encounter (Signed)
Patient requesting to have Dr. Gibson Ramp nurse call her. Did not want me to take a message. Please advise.

## 2020-09-30 NOTE — Telephone Encounter (Signed)
Attempted to reach patient by phone.  Mailbox full.  Unable to leave a message for her to call back.

## 2020-10-01 NOTE — Telephone Encounter (Signed)
Spoke with patient. She will come Thursday for appointment with Dr. [name].  Knows to arrive at 9:00 am.

## 2020-10-02 NOTE — Progress Notes (Signed)
Chief Complaint  Patient presents with  . Follow-up    chest pain   History of Present Illness: 59 yo female with history of non-ischemic cardiomyopathy, prior VF arrest September 19, 2011PAF here today for cardiac follow up. She was admitted to [facility] 19-Sep-2011after an out of hospital ventricular fibrillation arrest. No evidence of CAD by cath in 2011. Echo in 2011 showed LVEF=15%. Cardiac MRI in 2011 showed EF of 29%. Her cardiomyopathy was presumed to be non-ischemic, possibly from the history of cocaine abuse. She had an ICD implanted by Dr. Graciela Husbands on July 18, 2010. She has had chronic chest pain since her cardiac arrest. Echo August 2018 with LVEF=40-45%, grade 2 diastolic dysfunction, mild MR.  ICD generator change February 2019. She has refused to consider Entresto.   She is here today for follow up. The patient denies any dyspnea, lower extremity edema, orthopnea, PND, dizziness, near syncope or syncope. She has had some palpitations over the past few weeks. Nothing more than a few seconds. This feels like a skipped beat. She continue to have resting and exertional chest pain. No clear exacerbating factor. Does not occur after meals.    Primary Care Physician: Care, Jovita Kussmaul [facility]  Past Medical History:  Diagnosis Date  . Anemia, unspecified   . Atrial fibrillation (HCC)    a. This occurred in setting of resuscitation efforts during VF arrest.  . Automatic implantable cardiac defibrillator in situ    a. 18-Aug-2010. b. Gen chance in 10/2018.  . Cardiac arrest (HCC)    a. 08/18/10  . Chronic systolic heart failure (HCC)   . CKD (chronic kidney disease), stage II   . Depression    ok  . GERD (gastroesophageal reflux disease)   . History of cocaine abuse (HCC)    remote  . History of tobacco abuse   . Hypertension   . ICD (implantable cardioverter-defibrillator) lead failure--noise of both leads 6/15 isolated 08/13/2015  . Nonischemic cardiomyopathy (HCC)    a.  Aug 18, 2010 OOH VF arrest;  b. 08-18-2010 Cath: nonobs dzs, EF 15-20%, glob HK;  c. 08-18-10 Echo: EF 15%, diff HK, Gr 2 DD;  d. 2010/08/18 Cardiac MRI EF of 29%, no scar;  e. Aug 18, 2010 s/p SJM 3231-40 Bi V ICD Ser # [id number].  Marland Kitchen Other left bundle branch block   . Paroxysmal supraventricular tachycardia (HCC)   . Thyroid disease     Past Surgical History:  Procedure Laterality Date  . BIV ICD GENERATOR CHANGEOUT N/A 12/29/2017   Procedure: BIV ICD GENERATOR CHANGEOUT;  Surgeon: Duke Salvia, MD;  Location: [facility] INVASIVE CV LAB;  Service: Cardiovascular;  Laterality: N/A;  . CARDIAC DEFIBRILLATOR PLACEMENT     St Jude Unify CD Q323020 Q defib, serial number [id number]  . CARDIAC DEFIBRILLATOR PLACEMENT    . OPEN REDUCTION INTERNAL FIXATION (ORIF) DISTAL RADIAL FRACTURE Left 05/07/2014   Procedure: OPEN REDUCTION INTERNAL FIXATION (ORIF) DISTAL RADIAL FRACTURE;  Surgeon: Jodi Marble, MD;  Location: [facility] OR;  Service: Orthopedics;  Laterality: Left;    Current Outpatient Medications  Medication Sig Dispense Refill  . albuterol (PROVENTIL HFA;VENTOLIN HFA) 108 (90 BASE) MCG/ACT inhaler Inhale 2 puffs into the lungs every 4 (four) hours as needed for wheezing or shortness of breath.     Marland Kitchen aspirin 81 MG chewable tablet Chew 81 mg by mouth daily.    . carvedilol (COREG) 25 MG tablet TAKE 1 TABLET (25 MG TOTAL) BY MOUTH 2 (TWO) TIMES DAILY WITH  A MEAL. 180 tablet 3  . diphenhydrAMINE (BENADRYL) 25 MG tablet Take 1 tablet (25 mg total) by mouth every 6 (six) hours as needed for itching (Rash). 30 tablet 0  . furosemide (LASIX) 40 MG tablet TAKE 1 TABLET BY MOUTH EVERY DAY 90 tablet 3  . hydrocortisone cream 1 % Apply to affected area 2 times daily 15 g 0  . ibuprofen (ADVIL,MOTRIN) 200 MG tablet Take 200-400 mg by mouth every 6 (six) hours as needed for headache or mild pain.     Marland Kitchen lisinopril (PRINIVIL,ZESTRIL) 10 MG tablet Take 1 tablet (10 mg total) by mouth daily. 30 tablet 11  . methocarbamol (ROBAXIN) 500 MG tablet  Take 500 mg by mouth daily as needed for muscle spasms.    . Multiple Vitamin (MULTIVITAMIN WITH MINERALS) TABS Take 1 tablet by mouth daily.    . naproxen (NAPROSYN) 500 MG tablet Take 500 mg by mouth 2 (two) times daily as needed for moderate pain.     . potassium chloride SA (KLOR-CON M20) 20 MEQ tablet Take 1 tablet (20 mEq total) by mouth daily. 90 tablet 3  . traMADol (ULTRAM) 50 MG tablet Take 1 tablet (50 mg total) by mouth every 6 (six) hours as needed. FOR PAIN 30 tablet 0   No current facility-administered medications for this visit.    No Known Allergies  Social History   Socioeconomic History  . Marital status: Married    Spouse name: Not on file  . Number of children: 4  . Years of education: Not on file  . Highest education level: Not on file  Occupational History  . Occupation: Disablity  Tobacco Use  . Smoking status: Former Smoker    Quit date: 07/28/2011    Years since quitting: 9.1  . Smokeless tobacco: Never Used  Vaping Use  . Vaping Use: Never used  Substance and Sexual Activity  . Alcohol use: No  . Drug use: No    Comment: history of cocaine abuse last used 2008  . Sexual activity: Yes    Birth control/protection: I.U.D.  Other Topics Concern  . Not on file  Social History Narrative  . Not on file   Social Determinants of Health   Financial Resource Strain:   . Difficulty of Paying Living Expenses: Not on file  Food Insecurity:   . Worried About Programme researcher, broadcasting/film/video in the Last Year: Not on file  . Ran Out of Food in the Last Year: Not on file  Transportation Needs:   . Lack of Transportation (Medical): Not on file  . Lack of Transportation (Non-Medical): Not on file  Physical Activity:   . Days of Exercise per Week: Not on file  . Minutes of Exercise per Session: Not on file  Stress:   . Feeling of Stress : Not on file  Social Connections:   . Frequency of Communication with Friends and Family: Not on file  . Frequency of Social  Gatherings with Friends and Family: Not on file  . Attends Religious Services: Not on file  . Active Member of Clubs or Organizations: Not on file  . Attends Banker Meetings: Not on file  . Marital Status: Not on file  Intimate Partner Violence:   . Fear of Current or Ex-Partner: Not on file  . Emotionally Abused: Not on file  . Physically Abused: Not on file  . Sexually Abused: Not on file    Family History  Problem Relation Age of Onset  .  Heart disease Father   . Hypertension Father   . Hypertension Mother   . Heart disease Mother     Review of Systems:  As stated in the HPI and otherwise negative.   BP 140/88   Pulse 71   Ht 5\' 3"  (1.6 m)   Wt 143 lb (64.9 kg)   SpO2 97%   BMI 25.33 kg/m   Physical Examination:  General: Well developed, well nourished, NAD  HEENT: OP clear, mucus membranes moist  SKIN: warm, dry. No rashes. Neuro: No focal deficits  Musculoskeletal: Muscle strength 5/5 all ext  Psychiatric: Mood and affect normal  Neck: No JVD, no carotid bruits, no thyromegaly, no lymphadenopathy.  Lungs:Clear bilaterally, no wheezes, rhonci, crackles Cardiovascular: Regular rate and rhythm. No murmurs, gallops or rubs. Abdomen:Soft. Bowel sounds present. Non-tender.  Extremities: No lower extremity edema. Pulses are 2 + in the bilateral DP/PT.  Echo August 2018: Left ventricle: The cavity size was normal. Posterior wall   thickness was increased in a pattern of mild LVH. Systolic   function was mildly to moderately reduced. The estimated ejection   fraction was in the range of 40% to 45%. Diffuse hypokinesis.   Features are consistent with a pseudonormal left ventricular   filling pattern, with concomitant abnormal relaxation and   increased filling pressure (grade 2 diastolic dysfunction).   Doppler parameters are consistent with indeterminate ventricular   filling pressure. - Mitral valve: Transvalvular velocity was within the normal  range.   There was no evidence for stenosis. There was mild regurgitation. - Left atrium: The atrium was mildly dilated. - Right ventricle: The cavity size was normal. Wall thickness was   normal. Systolic function was normal. - Atrial septum: No defect or patent foramen ovale was identified   by color flow Doppler. - Tricuspid valve: There was mild regurgitation. - Pulmonary arteries: Systolic pressure was within the normal   range. PA peak pressure: 23 mm Hg (S).  EKG:  EKG is ordered today.  The EKG demonstrates sinus rhythm, V paced.   Recent Labs: No results found for requested labs within last 8760 hours.   Lipid Panel    Component Value Date/Time   CHOL 179 11/10/2012 1402   TRIG 97.0 11/10/2012 1402   HDL 34.80 (L) 11/10/2012 1402   CHOLHDL 5 11/10/2012 1402   VLDL 19.4 11/10/2012 1402   LDLCALC 125 (H) 11/10/2012 1402     Wt Readings from Last 3 Encounters:  10/03/20 143 lb (64.9 kg)  02/14/20 141 lb (64 kg)  08/11/19 132 lb (59.9 kg)     Other studies Reviewed: Additional studies/ records that were reviewed today include: ED records   Assessment and Plan:   1. Non ischemic cardiomyopathy: LVEF=40% by echo in 2018. ICD in place. We discussed adding Entresto but she has not wished to make this change. Continue Coreg and Lisinopril. She has not been on aldactone due to her poor medication compliance at times over the years.   2. Chronic systolic CHF: No volume overload on exam. Weight is stable. Continue Lasix.     3. Atrial fib, paroxysmal: Sinus today. Remote atrial fibrillation. No anti-coagulation  4. Substance abuse: In remission  5. Chest pain: She had no evidence of CAD by cath in 2011. Now having chest pains. Will arrange a gated cardiac CTA to exclude CAD  6. Palpitations: Device interrogation today showed no atrial or ventricular events.   Current medicines are reviewed at length with the patient today.  The patient does not have concerns regarding  medicines.  The following changes have been made:  no change  Labs/ tests ordered today include: echo  Orders Placed This Encounter  Procedures  . CT CORONARY MORPH W/CTA COR W/SCORE W/CA W/CM &/OR WO/CM  . CT CORONARY FRACTIONAL FLOW RESERVE DATA PREP  . CT CORONARY FRACTIONAL FLOW RESERVE FLUID ANALYSIS  . Basic metabolic panel  . EKG 12-Lead    Disposition:   FU with me in 12 months  Signed, Verne Carrow, MD 10/03/2020 10:18 AM    [facility] [address] [place], [place], [place]  [address] Phone: [phone]; Fax: [phone]

## 2020-10-03 ENCOUNTER — Encounter: Payer: Self-pay | Admitting: Cardiovascular Disease

## 2020-10-03 ENCOUNTER — Other Ambulatory Visit: Payer: Self-pay

## 2020-10-03 ENCOUNTER — Ambulatory Visit (INDEPENDENT_AMBULATORY_CARE_PROVIDER_SITE_OTHER): Payer: Medicaid Other | Admitting: Cardiovascular Disease

## 2020-10-03 VITALS — BP 140/88 | HR 71 | Ht 63.0 in | Wt 143.0 lb

## 2020-10-03 DIAGNOSIS — R072 Precordial pain: Secondary | ICD-10-CM | POA: Diagnosis not present

## 2020-10-03 DIAGNOSIS — R002 Palpitations: Secondary | ICD-10-CM | POA: Diagnosis not present

## 2020-10-03 DIAGNOSIS — Z01812 Encounter for preprocedural laboratory examination: Secondary | ICD-10-CM | POA: Diagnosis not present

## 2020-10-03 DIAGNOSIS — R079 Chest pain, unspecified: Secondary | ICD-10-CM | POA: Diagnosis not present

## 2020-10-03 LAB — CUP PACEART INCLINIC DEVICE CHECK
Battery Remaining Longevity: 42 mo
Brady Statistic RA Percent Paced: 3.4 %
Brady Statistic RV Percent Paced: 98 %
Date Time Interrogation Session: 20211104101418
HighPow Impedance: 42.075
Implantable Lead Implant Date: 20110819
Implantable Lead Implant Date: 20110819
Implantable Lead Implant Date: 20110819
Implantable Lead Location: 753858
Implantable Lead Location: 753859
Implantable Lead Location: 753860
Implantable Lead Model: 7121
Implantable Pulse Generator Implant Date: 20190130
Lead Channel Impedance Value: 312.5 Ohm
Lead Channel Impedance Value: 512.5 Ohm
Lead Channel Impedance Value: 812.5 Ohm
Lead Channel Pacing Threshold Amplitude: 0.5 V
Lead Channel Pacing Threshold Amplitude: 1 V
Lead Channel Pacing Threshold Amplitude: 1.5 V
Lead Channel Pacing Threshold Pulse Width: 0.5 ms
Lead Channel Pacing Threshold Pulse Width: 0.5 ms
Lead Channel Pacing Threshold Pulse Width: 0.6 ms
Lead Channel Sensing Intrinsic Amplitude: 12 mV
Lead Channel Sensing Intrinsic Amplitude: 2.4 mV
Lead Channel Setting Pacing Amplitude: 2 V
Lead Channel Setting Pacing Amplitude: 2.5 V
Lead Channel Setting Pacing Amplitude: 2.75 V
Lead Channel Setting Pacing Pulse Width: 0.5 ms
Lead Channel Setting Pacing Pulse Width: 0.6 ms
Lead Channel Setting Sensing Sensitivity: 0.5 mV
Pulse Gen Serial Number: 9782720

## 2020-10-03 NOTE — Patient Instructions (Addendum)
Medication Instructions:  No changes today *If you need a refill on your cardiac medications before your next appointment, please call your pharmacy*   Lab Work: none If you have labs (blood work) drawn today and your tests are completely normal, you will receive your results only by: Marland Kitchen MyChart Message (if you have MyChart) OR . A paper copy in the mail If you have any lab test that is abnormal or we need to change your treatment, we will call you to review the results.   Testing/Procedures: Cardiac cta --see instructions below   Follow-Up: As planned  Other Instructions  Your cardiac CT will be scheduled at one of the below locations:   [facility] [address] [place], [place] [address] [phone]  [facility] [address] [place], [place] [address] [phone]  If scheduled at [facility], please arrive at the [facility] main entrance of [facility] 30 minutes prior to test start time. Proceed to the [facility] Radiology Department (first floor) to check-in and test prep.  If scheduled at [facility], please arrive 15 mins early for check-in and test prep.  Please follow these instructions carefully (unless otherwise directed):   On the Night Before the Test: . Be sure to Drink plenty of water. . Do not consume any caffeinated/decaffeinated beverages or chocolate 12 hours prior to your test. . Do not take any antihistamines 12 hours prior to your test.  On the Day of the Test: . Drink plenty of water. Do not drink any water within one hour of the test. . Do not eat any food 4 hours prior to the test. . You may take your regular medications prior to the test.  . HOLD Furosemide morning of the test.  . FEMALES- please wear underwire-free bra if available       After the Test: . Drink plenty of water. . After receiving IV contrast, you may  experience a mild flushed feeling. This is normal. . On occasion, you may experience a mild rash up to 24 hours after the test. This is not dangerous. If this occurs, you can take Benadryl 25 mg and increase your fluid intake. . If you experience trouble breathing, this can be serious. If it is severe call 911 IMMEDIATELY. If it is mild, please call our office. . If you take any of these medications: Glipizide/Metformin, Avandament, Glucavance, please do not take 48 hours after completing test unless otherwise instructed.   Once we have confirmed authorization from your insurance company, we will call you to set up a date and time for your test. Based on how quickly your insurance processes prior authorizations requests, please allow up to 4 weeks to be contacted for scheduling your Cardiac CT appointment. Be advised that routine Cardiac CT appointments could be scheduled as many as 8 weeks after your provider has ordered it.  For non-scheduling related questions, please contact the cardiac imaging nurse navigator should you have any questions/concerns: Marchia Bond, Cardiac Imaging Nurse Navigator Burley Saver, Interim Cardiac Imaging Nurse Cataract and Vascular Services Direct Office Dial: [phone]   For scheduling needs, including cancellations and rescheduling, please call Vivien Rota at [phone], option 3.

## 2020-10-03 NOTE — Progress Notes (Deleted)
CRT-D device check in office. Thresholds and sensing consistent with previous device measurements. Lead impedance trends stable over time. AT/AF burden <1%, 763 AMS episodes with 15 EGMs available, EGMs show that show false AF d/t atrial noise (not a new finding), longest episode 1 minute 34 seconds.  No ventricular arrhythmia episodes recorded. Patient bi-ventricularly pacing 98% of the time. Device programmed with appropriate safety margins. Heart failure diagnostics reviewed and trends are stable for patient. No changes made this session. Estimated longevity 3.6-3.8 years.  Patient has follow-up with A. Tillery, PA-C 10/10/20, patient reminded of apt.  Patient education completed.

## 2020-10-10 ENCOUNTER — Encounter: Payer: Medicaid Other | Admitting: Student

## 2020-10-16 ENCOUNTER — Other Ambulatory Visit (HOSPITAL_COMMUNITY): Payer: Self-pay | Admitting: Emergency Medicine

## 2020-10-16 ENCOUNTER — Telehealth (HOSPITAL_COMMUNITY): Payer: Self-pay | Admitting: Emergency Medicine

## 2020-10-16 DIAGNOSIS — R072 Precordial pain: Secondary | ICD-10-CM

## 2020-10-16 MED ORDER — METOPROLOL TARTRATE 100 MG PO TABS: 100.0000 mg | ORAL_TABLET | Freq: Once | ORAL | 0 refills | Status: DC

## 2020-10-16 NOTE — Telephone Encounter (Signed)
Calling patient to inform her that I have prescribed her a one time dose metoprolol tartrate for CCTA rate control. I explained what its for and when to take it. I will contact her again closer to her appt to review all CCTA instructions. Pt appreciated the call.   Rockwell Alexandria RN Navigator Cardiac Imaging [facility] Vascular [phone] office [phone] cell

## 2020-10-16 NOTE — Progress Notes (Signed)
One time dose metop ordered for CCTA rate control.

## 2020-10-22 ENCOUNTER — Ambulatory Visit (HOSPITAL_COMMUNITY): Payer: Medicaid Other

## 2020-12-02 ENCOUNTER — Telehealth (HOSPITAL_COMMUNITY): Payer: Self-pay | Admitting: *Deleted

## 2020-12-02 NOTE — Telephone Encounter (Signed)
Attempted to call patient regarding upcoming cardiac CT appointment. Left message on voicemail with name and callback number  [name] RN Navigator Cardiac Imaging [facility] [phone] Office [phone] Cell  

## 2020-12-03 ENCOUNTER — Telehealth (HOSPITAL_COMMUNITY): Payer: Self-pay | Admitting: *Deleted

## 2020-12-03 NOTE — Telephone Encounter (Signed)
Reaching out to patient to offer assistance regarding upcoming cardiac imaging study; pt verbalizes understanding of appt date/time. Pt states that she has a lot of things going and will need to reschedule the scan but will have to call back as to when she can do the scan.  She will need to sort through her schedule.  Larey Brick RN Navigator Cardiac Imaging [facility] Heart and Vascular [phone] office [phone] cell.

## 2020-12-04 ENCOUNTER — Ambulatory Visit (HOSPITAL_COMMUNITY): Admission: RE | Admit: 2020-12-04 | Payer: Medicaid Other | Source: Ambulatory Visit

## 2020-12-23 ENCOUNTER — Telehealth: Payer: Self-pay | Admitting: Cardiovascular Disease

## 2020-12-23 NOTE — Telephone Encounter (Signed)
Patient c/o Palpitations:  High priority if patient c/o lightheadedness, shortness of breath, or chest pain  1) How long have you had palpitations/irregular HR/ Afib? Are you having the symptoms now? About a week.  2) Are you currently experiencing lightheadedness, SOB or CP? Lightheadedness, chest pain and SOB.  3) Do you have a history of afib (atrial fibrillation) or irregular heart rhythm? Yes.  4) Have you checked your BP or HR? (document readings if available): 147/90 HR 105  5) Are you experiencing any other symptoms? Patient states that her heart rate has been higher than normal, she states that she's experiencing SOB, chest pains and lightheadedness. Please advise.

## 2020-12-23 NOTE — Telephone Encounter (Signed)
Thank you Chris.

## 2020-12-23 NOTE — Telephone Encounter (Signed)
Angela Payne has only completed in-clinic checks throughout her device lifetime. Notes state she has refused home remote monitoring, per Dr. Graciela Husbands request every 6 months in office checks. If she isn't feeling well and will need her device checked she will need to see an APP.

## 2020-12-23 NOTE — Telephone Encounter (Signed)
Pt called to report that she has been having palpitations, chest pressure and dizziness over the past week. She says that she has not been doing remote ICD checks since her home device monitor does not work and I spoke with device and they confirmed that all of her checks have been done here at the office. No recent ICD checks since 09/2020.   Pt has had a Cardiac CT ordered from 09/2020 OV with Dr. [name] but the pt has not scheduled her test after numerous documented attempts due to her own unavailability.   I made her an appt with Chelsea Aus PA for reassessment on Thursday 12/26/20. Device to also see the pt as she needs to bring her home remote monitor in to also be assessed.   I had to leave a message for the pt to call back to give her appt date and time and to let her know if her symptoms are persistent and worsening she needs to go to the ED.

## 2020-12-24 NOTE — Telephone Encounter (Signed)
Patient is scheduled 12/26/20 with APP.

## 2020-12-26 ENCOUNTER — Ambulatory Visit: Payer: Medicaid Other | Admitting: Physician Assistant

## 2020-12-26 NOTE — Progress Notes (Deleted)
Cardiology Office Note:    Date:  12/26/2020   ID:  GIANELLA CHISMAR, DOB 1960-12-21, MRN [id number]  PCP:  Care, Jovita Kussmaul Total Access  [facility] Cardiologist:  Verne Carrow, MD  [facility] Electrophysiologist:  None   Chief Complaint: Palpitations, dizziness and chest pressure   History of Present Illness:    Angela Payne is a 60 y.o. female with a hx of nonischemic cardiomyopathy, prior VF arrest 06/2010, s/p ICD, chronic diastolic heart failure, hypertension and chronic diastolic heart failure added for multiple problems.  No evidence of CAD by cardiac catheterization in 2011. Echo in 2011 showed LV function of 15%. Cardiac MRI 2011with LV function of 29%. Her cardiomyopathy felt nonischemic possibly from cocaine abuse history.  History of chronic chest pain since her cardiac arrest.Echo August 2018 with LVEF=40-45%, grade 2 diastolic dysfunction, mild MR.  ICD generator change February 2019. She has refused to consider Entresto.   Patient is not enrolled in remote ICD monitoring. Remote hx of afib>> not on anticoagulation.   Presented today with multiple complains.   Past Medical History:  Diagnosis Date  . Anemia, unspecified   . Atrial fibrillation (HCC)    a. This occurred in setting of resuscitation efforts during VF arrest.  . Automatic implantable cardiac defibrillator in situ    a. 06/2010. b. Gen chance in 10/2018.  . Cardiac arrest (HCC)    a. 06/2010  . Chronic systolic heart failure (HCC)   . CKD (chronic kidney disease), stage II   . Depression    ok  . GERD (gastroesophageal reflux disease)   . History of cocaine abuse (HCC)    remote  . History of tobacco abuse   . Hypertension   . ICD (implantable cardioverter-defibrillator) lead failure--noise of both leads 6/15 isolated 08/13/2015  . Nonischemic cardiomyopathy (HCC)    a. 06/2010 OOH VF arrest;  b. 06/2010 Cath: nonobs dzs, EF 15-20%, glob HK;  c. 06/2010 Echo: EF 15%, diff HK, Gr 2 DD;   d. 06/2010 Cardiac MRI EF of 29%, no scar;  e. 06/2010 s/p SJM 3231-40 Bi V ICD Ser # [id number].  Marland Kitchen Other left bundle branch block   . Paroxysmal supraventricular tachycardia (HCC)   . Thyroid disease     Past Surgical History:  Procedure Laterality Date  . BIV ICD GENERATOR CHANGEOUT N/A 12/29/2017   Procedure: BIV ICD GENERATOR CHANGEOUT;  Surgeon: Duke Salvia, MD;  Location: [facility] INVASIVE CV LAB;  Service: Cardiovascular;  Laterality: N/A;  . CARDIAC DEFIBRILLATOR PLACEMENT     St Jude Unify CD Q323020 Q defib, serial number [id number]  . CARDIAC DEFIBRILLATOR PLACEMENT    . OPEN REDUCTION INTERNAL FIXATION (ORIF) DISTAL RADIAL FRACTURE Left 05/07/2014   Procedure: OPEN REDUCTION INTERNAL FIXATION (ORIF) DISTAL RADIAL FRACTURE;  Surgeon: Jodi Marble, MD;  Location: [facility] OR;  Service: Orthopedics;  Laterality: Left;    Current Medications: No outpatient medications have been marked as taking for the 12/26/20 encounter (Appointment) with Manson Passey, PA.     Allergies:   Patient has no known allergies.   Social History   Socioeconomic History  . Marital status: Married    Spouse name: Not on file  . Number of children: 4  . Years of education: Not on file  . Highest education level: Not on file  Occupational History  . Occupation: Disablity  Tobacco Use  . Smoking status: Former Smoker    Quit date: 07/28/2011    Years since quitting: 9.4  .  Smokeless tobacco: Never Used  Vaping Use  . Vaping Use: Never used  Substance and Sexual Activity  . Alcohol use: No  . Drug use: No    Comment: history of cocaine abuse last used 2008  . Sexual activity: Yes    Birth control/protection: I.U.D.  Other Topics Concern  . Not on file  Social History Narrative  . Not on file   Social Determinants of Health   Financial Resource Strain: Not on file  Food Insecurity: Not on file  Transportation Needs: Not on file  Physical Activity: Not on file  Stress: Not on file  Social  Connections: Not on file     Family History: The patient's family history includes Heart disease in her father and mother; Hypertension in her father and mother.  ***  ROS:   Please see the history of present illness.    All other systems reviewed and are negative. ***  EKGs/Labs/Other Studies Reviewed:    The following studies were reviewed today: ***  EKG:  EKG is *** ordered today.  The ekg ordered today demonstrates ***  Recent Labs: No results found for requested labs within last 8760 hours.  Recent Lipid Panel    Component Value Date/Time   CHOL 179 11/10/2012 1402   TRIG 97.0 11/10/2012 1402   HDL 34.80 (L) 11/10/2012 1402   CHOLHDL 5 11/10/2012 1402   VLDL 19.4 11/10/2012 1402   LDLCALC 125 (H) 11/10/2012 1402     Risk Assessment/Calculations:   {Does this patient have ATRIAL FIBRILLATION?[phone]}   Physical Exam:    VS:  There were no vitals taken for this visit.    Wt Readings from Last 3 Encounters:  10/03/20 143 lb (64.9 kg)  02/14/20 141 lb (64 kg)  08/11/19 132 lb (59.9 kg)     GEN: *** Well nourished, well developed in no acute distress HEENT: Normal NECK: No JVD; No carotid bruits LYMPHATICS: No lymphadenopathy CARDIAC: ***RRR, no murmurs, rubs, gallops RESPIRATORY:  Clear to auscultation without rales, wheezing or rhonchi  ABDOMEN: Soft, non-tender, non-distended MUSCULOSKELETAL:  No edema; No deformity  SKIN: Warm and dry NEUROLOGIC:  Alert and oriented x 3 PSYCHIATRIC:  Normal affect   ASSESSMENT AND PLAN:    1. ***  2. ***  Medication Adjustments/Labs and Tests Ordered: Current medicines are reviewed at length with the patient today.  Concerns regarding medicines are outlined above.  No orders of the defined types were placed in this encounter.  No orders of the defined types were placed in this encounter.   There are no Patient Instructions on file for this visit.   Lorelei Pont, [name]  12/26/2020 8:01 AM     [facility]

## 2020-12-27 ENCOUNTER — Telehealth: Payer: Self-pay | Admitting: Cardiovascular Disease

## 2020-12-27 NOTE — Telephone Encounter (Signed)
We were arranging the cardiac CT to exclude CAD since she has had chest pain. An echo will not show [place] her coronaries. She missed her appt in the office last week. I do not recommend an echo right now but she should be scheduled to see me or an office APP over the next few weeks if she is having chest pain or palpitations. Thanks, chris

## 2020-12-27 NOTE — Telephone Encounter (Signed)
Called and informed the patient that nurse Nani Skillern is out of office today. Patient states she would prefer an Echocardiogram rather than a CT scan and is hoping that Dr. [name] will approve. She states she just wants the ultrasound and does not want to have to go through the process of CT/ dye intake.   Will route to MW, RN and CM, MD for follow-up.

## 2020-12-27 NOTE — Telephone Encounter (Signed)
Angela Payne is calling requesting a callback from Angela Payne to discuss scheduling an Echo instead of a CT due to not wanting to have that performed.

## 2020-12-27 NOTE — Telephone Encounter (Signed)
Angela Hazel, MD  You;       We were arranging the cardiac CT to exclude CAD since she has had chest pain. An echo will not show [place] her coronaries. She missed her appt in the office last week. I do not recommend an echo right now but she should be scheduled to see me or an office APP over the next few weeks if she is having chest pain or palpitations. Thanks, [name] patient and informed her of the above. Patient repeatedly states she is not comfortable with CT scan at this time. She does want an ultrasound of her heart even if it does not show her coronaries. Advised patient that Echo would not show what Dr. [name] originally ordered CT. Patient would like Dr. [name] to order an Echocardiogram. Patient unable to state her need for an Echo, simply states that she should be able to have an ultrasound of her heart if she wants otherwise she can find a new cardiologist.   Advised patient that I would inform Dr. [name] of her request.   Patient has OV on 2/8 with [name], [name]. Encouraged patient to discuss concerns with provider at upcoming appointment.

## 2021-01-05 NOTE — Progress Notes (Signed)
Cardiology Office Note Date:  01/07/2021  Patient ID:  Angela, Payne 05-20-1961, MRN [id number] PCP:  [facility]  Cardiologist:  Dr. [name] Electrophysiologist: Dr. Graciela Husbands  Chief Complaint: overdue visit  History of Present Illness: Angela Payne is a 60 y.o. female with history of NICM, VF arrest, (Afib post arrest), HTN, LBBB, chronic CHF (systolic), CKD (II).  She comes in today to be seen for Dr. Graciela Husbands.  Last seen by EP service by A. [place], [name] Sept 2020 for pre-op (dental) evaluation.  Planned to continue in-clinic device checks Q 9mo, and annually with MD/APP.  Most recently saw Dr. [name] Nov 2021, discussed chronic CP since her arrest in 2011, historically declined Entresto.   Planned for CTA to evaluate further.  Appears despite numerous efforts by the office the patient never scheduled her CT and no showed to her  f/u APP visit.  Telephone notes with pt c/o palpitations, not feeling well.  Last device check 10/03/20 notes false AMS 2/2 A lead noise, known for her.  NO SHOWED to her annual provider visit   TODAY She is doing well. Tells me she is exercising and feels really good when she exercises regularly. She does say that she has had that same CP since her arrest back in 2011, she was scared to have the CT Dr. [name] ordered because of the dye. She denies any palpitations, no dizzy spells, near syncope or syncope. No SOB or DOE, no nocturnal symptoms     Device History: St. Jude BiV ICD implanted originally 06/2010,  gen change 12/29/2017 secondary prevention Known to have noise on her A lead  PT DECLINES REMOTE MONITORING   History of appropriate therapy: No History of AAD therapy: No   Past Medical History:  Diagnosis Date  . Anemia, unspecified   . Atrial fibrillation (HCC)    a. This occurred in setting of resuscitation efforts during VF arrest.  . Automatic implantable cardiac defibrillator in situ    a. 06/2010. b. Gen  chance in 10/2018.  . Cardiac arrest (HCC)    a. 06/2010  . Chronic systolic heart failure (HCC)   . CKD (chronic kidney disease), stage II   . Depression    ok  . GERD (gastroesophageal reflux disease)   . History of cocaine abuse (HCC)    remote  . History of tobacco abuse   . Hypertension   . ICD (implantable cardioverter-defibrillator) lead failure--noise of both leads 6/15 isolated 08/13/2015  . Nonischemic cardiomyopathy (HCC)    a. 06/2010 OOH VF arrest;  b. 06/2010 Cath: nonobs dzs, EF 15-20%, glob HK;  c. 06/2010 Echo: EF 15%, diff HK, Gr 2 DD;  d. 06/2010 Cardiac MRI EF of 29%, no scar;  e. 06/2010 s/p SJM 3231-40 Bi V ICD Ser # [id number].  Marland Kitchen Other left bundle branch block   . Paroxysmal supraventricular tachycardia (HCC)   . Thyroid disease     Past Surgical History:  Procedure Laterality Date  . BIV ICD GENERATOR CHANGEOUT N/A 12/29/2017   Procedure: BIV ICD GENERATOR CHANGEOUT;  Surgeon: Duke Salvia, MD;  Location: [facility] INVASIVE CV LAB;  Service: Cardiovascular;  Laterality: N/A;  . CARDIAC DEFIBRILLATOR PLACEMENT     St Jude Unify CD Q323020 Q defib, serial number [id number]  . CARDIAC DEFIBRILLATOR PLACEMENT    . OPEN REDUCTION INTERNAL FIXATION (ORIF) DISTAL RADIAL FRACTURE Left 05/07/2014   Procedure: OPEN REDUCTION INTERNAL FIXATION (ORIF) DISTAL RADIAL FRACTURE;  Surgeon: Cliffton Asters  Janee Morn, MD;  Location: [facility] OR;  Service: Orthopedics;  Laterality: Left;    Current Outpatient Medications  Medication Sig Dispense Refill  . albuterol (PROVENTIL HFA;VENTOLIN HFA) 108 (90 BASE) MCG/ACT inhaler Inhale 2 puffs into the lungs every 4 (four) hours as needed for wheezing or shortness of breath.     Marland Kitchen aspirin 81 MG chewable tablet Chew 81 mg by mouth daily.    . carvedilol (COREG) 25 MG tablet TAKE 1 TABLET (25 MG TOTAL) BY MOUTH 2 (TWO) TIMES DAILY WITH A MEAL. 180 tablet 3  . diphenhydrAMINE (BENADRYL) 25 MG tablet Take 1 tablet (25 mg total) by mouth every 6 (six) hours as needed for  itching (Rash). 30 tablet 0  . furosemide (LASIX) 40 MG tablet TAKE 1 TABLET BY MOUTH EVERY DAY 90 tablet 3  . hydrocortisone cream 1 % Apply 1 application topically as needed for itching.    Marland Kitchen ibuprofen (ADVIL,MOTRIN) 200 MG tablet Take 200-400 mg by mouth every 6 (six) hours as needed for headache or mild pain.     Marland Kitchen lisinopril (PRINIVIL,ZESTRIL) 10 MG tablet Take 1 tablet (10 mg total) by mouth daily. 30 tablet 11  . methocarbamol (ROBAXIN) 500 MG tablet Take 500 mg by mouth daily as needed for muscle spasms.    . Multiple Vitamin (MULTIVITAMIN WITH MINERALS) TABS Take 1 tablet by mouth daily.    . potassium chloride SA (KLOR-CON M20) 20 MEQ tablet Take 1 tablet (20 mEq total) by mouth daily. 90 tablet 3  . traMADol (ULTRAM) 50 MG tablet Take 1 tablet (50 mg total) by mouth every 6 (six) hours as needed. FOR PAIN 30 tablet 0   No current facility-administered medications for this visit.    Allergies:   Patient has no known allergies.   Social History:  The patient  reports that she quit smoking about 9 years ago. She has never used smokeless tobacco. She reports that she does not drink alcohol and does not use drugs.   Family History:  The patient's family history includes Heart disease in her father and mother; Hypertension in her father and mother.  ROS:  Please see the history of present illness.    All other systems are reviewed and otherwise negative.   PHYSICAL EXAM:  VS:  BP 130/80   Pulse 78   Ht 5\' 3"  (1.6 m)   Wt 141 lb 12.8 oz (64.3 kg)   SpO2 98%   BMI 25.12 kg/m  BMI: Body mass index is 25.12 kg/m. Well nourished, well developed, in no acute distress HEENT: normocephalic, atraumatic Neck: no JVD, carotid bruits or masses Cardiac:  RRR; no significant murmurs, no rubs, or gallops Lungs:  CTA b/l, no wheezing, rhonchi or rales Abd: soft, nontender MS: no deformity or atrophy Ext:  no edema Skin: warm and dry, no rash Neuro:  No gross deficits appreciated Psych:  euthymic mood, full affect  ICD site is stable, no tethering or discomfort   EKG:  Not done today   Device interrogation done today and reviewed by myself:  Battery and lead measurements are OK LV lead threshold is 2.0 today (1.5 previously), outputs left at 2.75V No V arrhythmias All AMS EGMs are reviewed and are A lead noise (known for her) 98% BP  07/13/2017: TTE Study Conclusions  - Left ventricle: The cavity size was normal. Posterior wall  thickness was increased in a pattern of mild LVH. Systolic  function was mildly to moderately reduced. The estimated ejection  fraction  was in the range of 40% to 45%. Diffuse hypokinesis.  Features are consistent with a pseudonormal left ventricular  filling pattern, with concomitant abnormal relaxation and  increased filling pressure (grade 2 diastolic dysfunction).  Doppler parameters are consistent with indeterminate ventricular  filling pressure.  - Mitral valve: Transvalvular velocity was within the normal range.  There was no evidence for stenosis. There was mild regurgitation.  - Left atrium: The atrium was mildly dilated.  - Right ventricle: The cavity size was normal. Wall thickness was  normal. Systolic function was normal.  - Atrial septum: No defect or patent foramen ovale was identified  by color flow Doppler.  - Tricuspid valve: There was mild regurgitation.  - Pulmonary arteries: Systolic pressure was within the normal  range. PA peak pressure: 23 mm Hg (S).    Recent Labs: No results found for requested labs within last 8760 hours.  No results found for requested labs within last 8760 hours.   CrCl cannot be calculated (Patient's most recent lab result is older than the maximum 21 days allowed.).   Wt Readings from Last 3 Encounters:  01/07/21 141 lb 12.8 oz (64.3 kg)  10/03/20 143 lb (64.9 kg)  02/14/20 141 lb (64 kg)     Other studies reviewed: Additional studies/records reviewed  today include: summarized above  ASSESSMENT AND PLAN:  1. ICD    Known A lead noise     Stable function otherwise, no programming changes made  Discussed with her the benefits of remote monitoring, capability to identify issues early, she at least for now is agreeable and will plug in her transmitter once home. I have asked device clinic to follow up with her and get her on the remote schedule  2. NICM 3. Chronic CHF (systolic/diastolic)     Last echo 2018, LVEF 40-45%, grade II DD     Weight is stable     CorVue is down, though no exam findings or symptoms of volume OL  4. H/o aborted cardiac arrest 2011     No CAD at the time by cath     No V arrhytmias  5. Chronic CP     She initially was agreeable for coronary Ca score though unable to afford the cost     She remains hesitant about the coronary CTa, though says if her labs looks OK and we thinks she is safe to have it after seeing her labs, she would be agreeable.  She tells me she has not had labs done in years, will ordered fasting lipids, get CMET, CBC  done as well.       6. HTN     No changes        Disposition: F/u with remotes, she would like to come into clinic in 24mo for device check, f/u with Dr. McAlhaney/team as recommended by them.  Current medicines are reviewed at length with the patient today.  The patient did not have any concerns regarding medicines.  Norma Fredrickson, PA-C 01/07/2021 9:57 AM     [facility] [address] [place] [address] [phone] (office)  [phone] (fax)

## 2021-01-07 ENCOUNTER — Other Ambulatory Visit: Payer: Self-pay

## 2021-01-07 ENCOUNTER — Encounter: Payer: Self-pay | Admitting: Physician Assistant

## 2021-01-07 ENCOUNTER — Ambulatory Visit (INDEPENDENT_AMBULATORY_CARE_PROVIDER_SITE_OTHER): Payer: Medicaid Other | Admitting: Physician Assistant

## 2021-01-07 VITALS — BP 130/80 | HR 78 | Ht 63.0 in | Wt 141.8 lb

## 2021-01-07 DIAGNOSIS — Z95810 Presence of automatic (implantable) cardiac defibrillator: Secondary | ICD-10-CM | POA: Diagnosis not present

## 2021-01-07 DIAGNOSIS — R079 Chest pain, unspecified: Secondary | ICD-10-CM

## 2021-01-07 DIAGNOSIS — Z79899 Other long term (current) drug therapy: Secondary | ICD-10-CM

## 2021-01-07 DIAGNOSIS — I10 Essential (primary) hypertension: Secondary | ICD-10-CM

## 2021-01-07 DIAGNOSIS — I5022 Chronic systolic (congestive) heart failure: Secondary | ICD-10-CM

## 2021-01-07 DIAGNOSIS — I428 Other cardiomyopathies: Secondary | ICD-10-CM

## 2021-01-07 DIAGNOSIS — I469 Cardiac arrest, cause unspecified: Secondary | ICD-10-CM

## 2021-01-07 LAB — CUP PACEART INCLINIC DEVICE CHECK
Battery Remaining Longevity: 38 mo
Brady Statistic RA Percent Paced: 2.1 %
Brady Statistic RV Percent Paced: 98 %
Date Time Interrogation Session: 20220208100907
HighPow Impedance: 37.6344
Implantable Lead Implant Date: 20110819
Implantable Lead Implant Date: 20110819
Implantable Lead Implant Date: 20110819
Implantable Lead Location: 753858
Implantable Lead Location: 753859
Implantable Lead Location: 753860
Implantable Lead Model: 7121
Implantable Pulse Generator Implant Date: 20190130
Lead Channel Impedance Value: 275 Ohm
Lead Channel Impedance Value: 462.5 Ohm
Lead Channel Impedance Value: 762.5 Ohm
Lead Channel Pacing Threshold Amplitude: 0.5 V
Lead Channel Pacing Threshold Amplitude: 0.5 V
Lead Channel Pacing Threshold Amplitude: 1 V
Lead Channel Pacing Threshold Amplitude: 1 V
Lead Channel Pacing Threshold Amplitude: 2 V
Lead Channel Pacing Threshold Amplitude: 2 V
Lead Channel Pacing Threshold Pulse Width: 0.5 ms
Lead Channel Pacing Threshold Pulse Width: 0.5 ms
Lead Channel Pacing Threshold Pulse Width: 0.5 ms
Lead Channel Pacing Threshold Pulse Width: 0.5 ms
Lead Channel Pacing Threshold Pulse Width: 0.6 ms
Lead Channel Pacing Threshold Pulse Width: 0.6 ms
Lead Channel Sensing Intrinsic Amplitude: 12 mV
Lead Channel Sensing Intrinsic Amplitude: 2.4 mV
Lead Channel Setting Pacing Amplitude: 2 V
Lead Channel Setting Pacing Amplitude: 2.5 V
Lead Channel Setting Pacing Amplitude: 2.75 V
Lead Channel Setting Pacing Pulse Width: 0.5 ms
Lead Channel Setting Pacing Pulse Width: 0.6 ms
Lead Channel Setting Sensing Sensitivity: 0.5 mV
Pulse Gen Serial Number: 9782720

## 2021-01-07 NOTE — Patient Instructions (Signed)
Medication Instructions:   Your physician recommends that you continue on your current medications as directed. Please refer to the Current Medication list given to you today.  *If you need a refill on your cardiac medications before your next appointment, please call your pharmacy*   Lab Work:  RETURN FASTING  CMET LIPIDS AND CBC   If you have labs (blood work) drawn today and your tests are completely normal, you will receive your results only by: Marland Kitchen MyChart Message (if you have MyChart) OR . A paper copy in the mail If you have any lab test that is abnormal or we need to change your treatment, we will call you to review the results.   Testing/Procedures: Non-Cardiac CT scanning, (CAT scanning), is a noninvasive, special x-ray that produces cross-sectional images of the body using x-rays and a computer. CT scans help physicians diagnose and treat medical conditions. For some CT exams, a contrast material is used to enhance visibility in the area of the body being studied. CT scans provide greater clarity and reveal more details than regular x-ray exams.   Follow-Up: At [facility], you and your health needs are our priority.  As part of our continuing mission to provide you with exceptional heart care, we have created designated Provider Care Teams.  These Care Teams include your primary Cardiologist (physician) and Advanced Practice Providers (APPs -  Physician Assistants and Nurse Practitioners) who all work together to provide you with the care you need, when you need it.  We recommend signing up for the patient portal called "MyChart".  Sign up information is provided on this After Visit Summary.  MyChart is used to connect with patients for Virtual Visits (Telemedicine).  Patients are able to view lab/test results, encounter notes, upcoming appointments, etc.  Non-urgent messages can be sent to your provider as well.   To learn more about what you can do with MyChart, go to  [email or web address].    Your next appointment:   3 month(s)  The format for your next appointment:   In Person  Provider:    You may see Francis Dowse, PA-C   Other Instructions

## 2021-01-08 ENCOUNTER — Telehealth: Payer: Self-pay | Admitting: Emergency Medicine

## 2021-01-08 NOTE — Telephone Encounter (Signed)
Spoke with patient. She has remote monitor at home but it is not plugged in. Patient willing to do home remote transmissions. Will call DC when she gets home to assist with sending transmission.

## 2021-01-10 NOTE — Telephone Encounter (Signed)
I follow up with the patient to see if I could help her send the transmission. She is going to pick up the monitor and do it when she get home. I gave her verbal instructions.

## 2021-01-17 NOTE — Telephone Encounter (Signed)
Patient reports she is going to get her home monitor from a relative's home today. Patient to call the device clinic after she gets home and sets up her monitor.

## 2021-01-29 NOTE — Telephone Encounter (Signed)
Certified letter sent 01/29/2021

## 2021-02-04 ENCOUNTER — Other Ambulatory Visit: Payer: Medicaid Other

## 2021-02-12 NOTE — Progress Notes (Deleted)
Cardiology Office Note   Date:  02/12/2021   ID:  BONNEE ZERTUCHE, DOB 1961/08/26, MRN [id number]  PCP:  [facility], [facility]  Cardiologist: Dr. Verne Carrow, MD  No chief complaint on file.   History of Present Illness: Angela Payne is a 60 y.o. female who presents for overdue follow-up, seen for Dr. [name].  Ms. Harkey has a history of nonischemic cardiomyopathy, VF arrest 2011, postarrest atrial fibrillation, HTN, LBBB, chronic systolic CHF and CKD stage II.  She was initially seen by cardiology after admission to [facility] 06/2010 after an out of hospital VF arrest.  Cardiac cath showed no evidence of CAD.  Echocardiogram at that time showed an LVEF of 15%.  She underwent a cardiac MRI which showed an EF at 29%.  Her cardiomyopathy is presumed to be nonischemic however could also very well be from a history of cocaine abuse.  She had an ICD implanted by Dr. Graciela Husbands 06/2010.  She has had issues with chronic chest pain since her arrest.  Repeat echocardiogram 06/2017 with LVEF at 40 to 45%, G2 DD and mild MR.  ICD generator change was performed 12/2017.  She was previously offered Sherryll Burger however she has continued to defer.  In most recent follow-up with Dr. [name] 10/03/2020 she continued to have resting and exertional chest pain with no clear exacerbating factors.  Plan at that time was to pursue cardiac CTA which was ordered however never performed.  She last saw EP 12/2020.  During that encounter, she reported being nervous to pursue the CTA due to the contrast dye.  Today,   1. Non ischemic cardiomyopathy: LVEF=40% by echo in 2018. ICD in place. We discussed adding Entresto but she has not wished to make this change. Continue Coreg and Lisinopril. She has not been on aldactone due to her poor medication compliance at times over the years.   2. Chronic systolic CHF: No volume overload on exam. Weight is stable. Continue Lasix.     3. Atrial fib, paroxysmal:  Sinus today. Remote atrial fibrillation. No anti-coagulation  4. Substance abuse: In remission  5. Chest pain: She had no evidence of CAD by cath in 2011. Now having chest pains. Will arrange a gated cardiac CTA to exclude CAD  6. Palpitations: Device interrogation today showed no atrial or ventricular events.    Past Medical History:  Diagnosis Date  . Anemia, unspecified   . Atrial fibrillation (HCC)    a. This occurred in setting of resuscitation efforts during VF arrest.  . Automatic implantable cardiac defibrillator in situ    a. 06/2010. b. Gen chance in 10/2018.  . Cardiac arrest (HCC)    a. 06/2010  . Chronic systolic heart failure (HCC)   . CKD (chronic kidney disease), stage II   . Depression    ok  . GERD (gastroesophageal reflux disease)   . History of cocaine abuse (HCC)    remote  . History of tobacco abuse   . Hypertension   . ICD (implantable cardioverter-defibrillator) lead failure--noise of both leads 6/15 isolated 08/13/2015  . Nonischemic cardiomyopathy (HCC)    a. 06/2010 OOH VF arrest;  b. 06/2010 Cath: nonobs dzs, EF 15-20%, glob HK;  c. 06/2010 Echo: EF 15%, diff HK, Gr 2 DD;  d. 06/2010 Cardiac MRI EF of 29%, no scar;  e. 06/2010 s/p SJM 3231-40 Bi V ICD Ser # [id number].  Marland Kitchen Other left bundle branch block   . Paroxysmal supraventricular tachycardia (HCC)   .  Thyroid disease     Past Surgical History:  Procedure Laterality Date  . BIV ICD GENERATOR CHANGEOUT N/A 12/29/2017   Procedure: BIV ICD GENERATOR CHANGEOUT;  Surgeon: Duke Salvia, MD;  Location: [facility] INVASIVE CV LAB;  Service: Cardiovascular;  Laterality: N/A;  . CARDIAC DEFIBRILLATOR PLACEMENT     St Jude Unify CD Q323020 Q defib, serial number [id number]  . CARDIAC DEFIBRILLATOR PLACEMENT    . OPEN REDUCTION INTERNAL FIXATION (ORIF) DISTAL RADIAL FRACTURE Left 05/07/2014   Procedure: OPEN REDUCTION INTERNAL FIXATION (ORIF) DISTAL RADIAL FRACTURE;  Surgeon: Jodi Marble, MD;  Location: [facility] OR;  Service:  Orthopedics;  Laterality: Left;     Current Outpatient Medications  Medication Sig Dispense Refill  . albuterol (PROVENTIL HFA;VENTOLIN HFA) 108 (90 BASE) MCG/ACT inhaler Inhale 2 puffs into the lungs every 4 (four) hours as needed for wheezing or shortness of breath.     Marland Kitchen aspirin 81 MG chewable tablet Chew 81 mg by mouth daily.    . carvedilol (COREG) 25 MG tablet TAKE 1 TABLET (25 MG TOTAL) BY MOUTH 2 (TWO) TIMES DAILY WITH A MEAL. 180 tablet 3  . diphenhydrAMINE (BENADRYL) 25 MG tablet Take 1 tablet (25 mg total) by mouth every 6 (six) hours as needed for itching (Rash). 30 tablet 0  . furosemide (LASIX) 40 MG tablet TAKE 1 TABLET BY MOUTH EVERY DAY 90 tablet 3  . hydrocortisone cream 1 % Apply 1 application topically as needed for itching.    Marland Kitchen ibuprofen (ADVIL,MOTRIN) 200 MG tablet Take 200-400 mg by mouth every 6 (six) hours as needed for headache or mild pain.     Marland Kitchen lisinopril (PRINIVIL,ZESTRIL) 10 MG tablet Take 1 tablet (10 mg total) by mouth daily. 30 tablet 11  . methocarbamol (ROBAXIN) 500 MG tablet Take 500 mg by mouth daily as needed for muscle spasms.    . Multiple Vitamin (MULTIVITAMIN WITH MINERALS) TABS Take 1 tablet by mouth daily.    . potassium chloride SA (KLOR-CON M20) 20 MEQ tablet Take 1 tablet (20 mEq total) by mouth daily. 90 tablet 3  . traMADol (ULTRAM) 50 MG tablet Take 1 tablet (50 mg total) by mouth every 6 (six) hours as needed. FOR PAIN 30 tablet 0   No current facility-administered medications for this visit.    Allergies:   Patient has no known allergies.    Social History:  The patient  reports that she quit smoking about 9 years ago. She has never used smokeless tobacco. She reports that she does not drink alcohol and does not use drugs.   Family History:  The patient's ***family history includes Heart disease in her father and mother; Hypertension in her father and mother.    ROS:  Please see the history of present illness.   Otherwise, review of  systems are positive for {NONE DEFAULTED:18576::"none"}.   All other systems are reviewed and negative.    PHYSICAL EXAM: VS:  There were no vitals taken for this visit. , BMI There is no height or weight on file to calculate BMI. GEN: Well nourished, well developed, in no acute distress HEENT: normal Neck: no JVD, carotid bruits, or masses Cardiac: ***RRR; no murmurs, rubs, or gallops,no edema  Respiratory:  clear to auscultation bilaterally, normal work of breathing GI: soft, nontender, nondistended, + BS MS: no deformity or atrophy Skin: warm and dry, no rash Neuro:  Strength and sensation are intact Psych: euthymic mood, full affect   EKG:  EKG {ACTION; IS/IS KNL:[id number]} ordered  today. The ekg ordered today demonstrates ***   Recent Labs: No results found for requested labs within last 8760 hours.    Lipid Panel    Component Value Date/Time   CHOL 179 11/10/2012 1402   TRIG 97.0 11/10/2012 1402   HDL 34.80 (L) 11/10/2012 1402   CHOLHDL 5 11/10/2012 1402   VLDL 19.4 11/10/2012 1402   LDLCALC 125 (H) 11/10/2012 1402      Wt Readings from Last 3 Encounters:  01/07/21 141 lb 12.8 oz (64.3 kg)  10/03/20 143 lb (64.9 kg)  02/14/20 141 lb (64 kg)      Other studies Reviewed: Additional studies/ records that were reviewed today include: ***. Review of the above records demonstrates: ***   Echo August 2018: Left ventricle: The cavity size was normal. Posterior wall thickness was increased in a pattern of mild LVH. Systolic function was mildly to moderately reduced. The estimated ejection fraction was in the range of 40% to 45%. Diffuse hypokinesis. Features are consistent with a pseudonormal left ventricular filling pattern, with concomitant abnormal relaxation and increased filling pressure (grade 2 diastolic dysfunction). Doppler parameters are consistent with indeterminate ventricular filling pressure. - Mitral valve: Transvalvular velocity  was within the normal range. There was no evidence for stenosis. There was mild regurgitation. - Left atrium: The atrium was mildly dilated. - Right ventricle: The cavity size was normal. Wall thickness was normal. Systolic function was normal. - Atrial septum: No defect or patent foramen ovale was identified by color flow Doppler. - Tricuspid valve: There was mild regurgitation. - Pulmonary arteries: Systolic pressure was within the normal range. PA peak pressure: 23 mm Hg (S).  ASSESSMENT AND PLAN:  1.  ***   Current medicines are reviewed at length with the patient today.  The patient {ACTIONS; HAS/DOES NOT HAVE:19233} concerns regarding medicines.  The following changes have been made:  {PLAN; NO CHANGE:13088:s}  Labs/ tests ordered today include: *** No orders of the defined types were placed in this encounter.    Disposition:   FU with *** in {gen number 3-82:505397} {Days to years:10300}  Signed, Georgie Chard, NP  02/12/2021 9:30 AM    [facility] [address] [place], [place], [place]  [address] Phone: [phone]; Fax: [phone]

## 2021-02-13 ENCOUNTER — Ambulatory Visit: Payer: Medicaid Other | Admitting: Cardiology

## 2021-04-01 ENCOUNTER — Encounter: Payer: Medicaid Other | Admitting: Physician Assistant

## 2021-04-01 NOTE — Progress Notes (Signed)
Cardiology Office Note Date:  04/02/2021  Patient ID:  Angela Payne, Angela Payne Mar 17, 1961, MRN [id number] PCP:  [facility]  Cardiologist:  Dr. [name] Electrophysiologist: Dr. Graciela Husbands  Chief Complaint:  follow up  History of Present Illness: Angela Payne is a 60 y.o. female with history of NICM, VF arrest, (Afib post arrest), HTN, LBBB, chronic CHF (systolic), CKD (II).  She comes in today to be seen for Dr. Graciela Husbands.  Last seen by EP service by A. [place], [name] Sept 2020 for pre-op (dental) evaluation.  Planned to continue in-clinic device checks Q 39mo, and annually with MD/APP.  Most recently saw Dr. [name] Nov 2021, discussed chronic CP since her arrest in 2011, historically declined Entresto.   Planned for CTA to evaluate further.  Appears despite numerous efforts by the office the patient never scheduled her CT and no showed to her  f/u APP visit.  Telephone notes with pt c/o palpitations, not feeling well.  Last device check 10/03/20 notes false AMS 2/2 A lead noise, known for her.  NO SHOWED to her annual provider visit   I saw her feb 2022 She is doing well. Tells me she is exercising and feels really good when she exercises regularly. She does say that she has had that same CP since her arrest back in 2011, she was scared to have the CT Dr. [name] ordered because of the dye. She denies any palpitations, no dizzy spells, near syncope or syncope. No SOB or DOE, no nocturnal symptoms  Initially planned for coronary calcium score though unable to afford thew cost, remained hesitant to do CT angio Discussed remote monitoring benefits, she was agreeable, d/w device clinic to have monitor ordered and sent to start remotes. Labs ordered but not done   TODAY She is doing well, continues to exercise at home, walks and bikes. Has good exertional capacity C/w the random L sided chest aching that she has mentioned in the past, again, not new and unchanged behavior No unusual  SOB or DOE No symptoms of orthopnea or PND No near syncope or syncope.  She is worried about her battery running out or the device rusting. Worries about the state of the world and the possibility of not having access to health care down the road.  Device History: [employer]. Jude BiV ICD implanted originally 06/2010,  gen change 12/29/2017 secondary prevention Known to have noise on her A lead  History of appropriate therapy: No History of AAD therapy: No   Past Medical History:  Diagnosis Date  . Anemia, unspecified   . Atrial fibrillation (HCC)    a. This occurred in setting of resuscitation efforts during VF arrest.  . Automatic implantable cardiac defibrillator in situ    a. 06/2010. b. Gen chance in 10/2018.  . Cardiac arrest (HCC)    a. 06/2010  . Chronic systolic heart failure (HCC)   . CKD (chronic kidney disease), stage II   . Depression    ok  . GERD (gastroesophageal reflux disease)   . History of cocaine abuse (HCC)    remote  . History of tobacco abuse   . Hypertension   . ICD (implantable cardioverter-defibrillator) lead failure--noise of both leads 6/15 isolated 08/13/2015  . Nonischemic cardiomyopathy (HCC)    a. 06/2010 OOH VF arrest;  b. 06/2010 Cath: nonobs dzs, EF 15-20%, glob HK;  c. 06/2010 Echo: EF 15%, diff HK, Gr 2 DD;  d. 06/2010 Cardiac MRI EF of 29%, no scar;  e.  06/2010 s/p SJM 3231-40 Bi V ICD Ser # [id number].  Marland Kitchen Other left bundle branch block   . Paroxysmal supraventricular tachycardia (HCC)   . Thyroid disease     Past Surgical History:  Procedure Laterality Date  . BIV ICD GENERATOR CHANGEOUT N/A 12/29/2017   Procedure: BIV ICD GENERATOR CHANGEOUT;  Surgeon: Duke Salvia, MD;  [facility] INVAS[facility] CV LAB;  Service: Cardiovascular;  Laterality: N/A;  . CARDIAC DEFIBRILLATOR PLACEMENT     St Jude Unify CD [id number] Q defib, serial number [id number]  . CARDIAC DEFIBRILLATOR PLACEMENT    . OPEN REDUCTION INTERNAL FIXATION (ORIF) DISTAL RADIAL FRACTURE Left 05/07/2014    Procedure: OPEN REDUCTION INTERNAL FIXATION (ORIF) DISTAL RADIAL FRACTURE;  Surgeon: Jodi Marble, MD;  Location: [facility] OR;  Service: Orthopedics;  Laterality: Left;    Current Outpatient Medications  Medication Sig Dispense Refill  . albuterol (PROVENTIL HFA;VENTOLIN HFA) 108 (90 BASE) MCG/ACT inhaler Inhale 2 puffs into the lungs every 4 (four) hours as needed for wheezing or shortness of breath.     Marland Kitchen aspirin 81 MG chewable tablet Chew 81 mg by mouth daily.    . carvedilol (COREG) 25 MG tablet TAKE 1 TABLET (25 MG TOTAL) BY MOUTH 2 (TWO) TIMES DAILY WITH A MEAL. 180 tablet 3  . diphenhydrAMINE (BENADRYL) 25 MG tablet Take 1 tablet (25 mg total) by mouth every 6 (six) hours as needed for itching (Rash). 30 tablet 0  . furosemide (LASIX) 40 MG tablet TAKE 1 TABLET BY MOUTH EVERY DAY 90 tablet 3  . hydrocortisone cream 1 % Apply 1 application topically as needed for itching.    Marland Kitchen ibuprofen (ADVIL,MOTRIN) 200 MG tablet Take 200-400 mg by mouth every 6 (six) hours as needed for headache or mild pain.     Marland Kitchen lisinopril (PRINIVIL,ZESTRIL) 10 MG tablet Take 1 tablet (10 mg total) by mouth daily. 30 tablet 11  . methocarbamol (ROBAXIN) 500 MG tablet Take 500 mg by mouth daily as needed for muscle spasms.    . metoprolol tartrate (LOPRESSOR) 100 MG tablet Take 1 tablet (100 mg total) by mouth once for 1 dose. 2 hours before procedure 1 tablet 0  . Multiple Vitamin (MULTIVITAMIN WITH MINERALS) TABS Take 1 tablet by mouth daily.    . potassium chloride SA (KLOR-CON M20) 20 MEQ tablet Take 1 tablet (20 mEq total) by mouth daily. 90 tablet 3  . traMADol (ULTRAM) 50 MG tablet Take 1 tablet (50 mg total) by mouth every 6 (six) hours as needed. FOR PAIN 30 tablet 0   No current facility-administered medications for this visit.    Allergies:   Patient has no known allergies.   Social History:  The patient  reports that she quit smoking about 9 years ago. She has never used smokeless tobacco. She reports  that she does not drink alcohol and does not use drugs.   Family History:  The patient's family history includes Heart disease in her father and mother; Hypertension in her father and mother.  ROS:  Please see the history of present illness.    All other systems are reviewed and otherwise negative.   PHYSICAL EXAM:  VS:  BP (!) 138/92   Pulse 62   Ht 5\' 3"  (1.6 m)   Wt 138 lb (62.6 kg)   SpO2 97%   BMI 24.45 kg/m  BMI: Body mass index is 24.45 kg/m. Well nourished, well developed, in no acute distress HEENT: normocephalic, atraumatic Neck: no JVD, carotid bruits or  masses Cardiac:  RRR; no significant murmurs, no rubs, or gallops Lungs:  CTA b/l, no wheezing, rhonchi or rales Abd: soft, nontender MS: no deformity or atrophy Ext: no edema Skin: warm and dry, no rash Neuro:  No gross deficits appreciated Psych: euthymic mood, full affect  ICD site is stable, no tethering or discomfort   EKG:  Not done today AV paced, no acute/ischemic changes  Device interrogation done today and reviewed by myself:  Battery and lead measurements are good No arrhythmias AMS are all A lead noise known for her No VT   07/13/2017: TTE Study Conclusions  - Left ventricle: The cavity size was normal. Posterior wall  thickness was increased in a pattern of mild LVH. Systolic  function was mildly to moderately reduced. The estimated ejection  fraction was in the range of 40% to 45%. Diffuse hypokinesis.  Features are consistent with a pseudonormal left ventricular  filling pattern, with concomitant abnormal relaxation and  increased filling pressure (grade 2 diastolic dysfunction).  Doppler parameters are consistent with indeterminate ventricular  filling pressure.  - Mitral valve: Transvalvular velocity was within the normal range.  There was no evidence for stenosis. There was mild regurgitation.  - Left atrium: The atrium was mildly dilated.  - Right ventricle: The  cavity size was normal. Wall thickness was  normal. Systolic function was normal.  - Atrial septum: No defect or patent foramen ovale was identified  by color flow Doppler.  - Tricuspid valve: There was mild regurgitation.  - Pulmonary arteries: Systolic pressure was within the normal  range. PA peak pressure: 23 mm Hg (S).    Recent Labs: No results found for requested labs within last 8760 hours.  No results found for requested labs within last 8760 hours.   CrCl cannot be calculated (Patient's most recent lab result is older than the maximum 21 days allowed.).   Wt Readings from Last 3 Encounters:  04/02/21 138 lb (62.6 kg)  01/07/21 141 lb 12.8 oz (64.3 kg)  10/03/20 143 lb (64.9 kg)     Other studies reviewed: Additional studies/records reviewed today include: summarized above  ASSESSMENT AND PLAN:  1. ICD    Known A lead noise    Stable function otherwise, no programming changes made  She reports she has not gotten transmitter yet, I have staff messaged the device clinci staff to follow up on thie  2. NICM 3. Chronic CHF (systolic/diastolic)     Last echo 2018, LVEF 40-45%, grade II DD     Weight is stable     CorVue is down, though again no exam findings or symptoms of volume OL  4. H/o aborted cardiac arrest 2011     No CAD at the time by cath     No V arrhytmias  5. Chronic CP     Discussed given cronicity and atypical fetures, she remains very worried  About and would like to proceed with CTa coronaries  6. HTN      No changes        Disposition: see her back in 43mo for device visit, sooner if needed.  Dr. [name] as recommended by him  Current medicines are reviewed at length with the patient today.  The patient did not have any concerns regarding medicines.  Norma Fredrickson, PA-C 04/02/2021 9:35 AM     [facility] [address] [place] [address] [phone] (office)  [phone] (fax)

## 2021-04-02 ENCOUNTER — Encounter: Payer: Self-pay | Admitting: Physician Assistant

## 2021-04-02 ENCOUNTER — Ambulatory Visit (INDEPENDENT_AMBULATORY_CARE_PROVIDER_SITE_OTHER): Payer: Medicaid Other | Admitting: Physician Assistant

## 2021-04-02 ENCOUNTER — Other Ambulatory Visit: Payer: Self-pay

## 2021-04-02 ENCOUNTER — Telehealth: Payer: Self-pay | Admitting: Emergency Medicine

## 2021-04-02 VITALS — BP 138/92 | HR 62 | Ht 63.0 in | Wt 138.0 lb

## 2021-04-02 DIAGNOSIS — I428 Other cardiomyopathies: Secondary | ICD-10-CM | POA: Diagnosis not present

## 2021-04-02 DIAGNOSIS — R079 Chest pain, unspecified: Secondary | ICD-10-CM

## 2021-04-02 DIAGNOSIS — Z95810 Presence of automatic (implantable) cardiac defibrillator: Secondary | ICD-10-CM | POA: Diagnosis not present

## 2021-04-02 DIAGNOSIS — I5022 Chronic systolic (congestive) heart failure: Secondary | ICD-10-CM | POA: Diagnosis not present

## 2021-04-02 DIAGNOSIS — I10 Essential (primary) hypertension: Secondary | ICD-10-CM

## 2021-04-02 DIAGNOSIS — I469 Cardiac arrest, cause unspecified: Secondary | ICD-10-CM

## 2021-04-02 LAB — CUP PACEART INCLINIC DEVICE CHECK
Battery Remaining Longevity: 37 mo
Brady Statistic RA Percent Paced: 1.7 %
Brady Statistic RV Percent Paced: 99 %
Date Time Interrogation Session: 20220504171827
HighPow Impedance: 39.3281
Implantable Lead Implant Date: 20110819
Implantable Lead Implant Date: 20110819
Implantable Lead Implant Date: 20110819
Implantable Lead Location: 753858
Implantable Lead Location: 753859
Implantable Lead Location: 753860
Implantable Lead Model: 7121
Implantable Pulse Generator Implant Date: 20190130
Lead Channel Impedance Value: 262.5 Ohm
Lead Channel Impedance Value: 525 Ohm
Lead Channel Impedance Value: 725 Ohm
Lead Channel Pacing Threshold Amplitude: 0.5 V
Lead Channel Pacing Threshold Amplitude: 0.5 V
Lead Channel Pacing Threshold Amplitude: 1 V
Lead Channel Pacing Threshold Amplitude: 1 V
Lead Channel Pacing Threshold Amplitude: 1.25 V
Lead Channel Pacing Threshold Amplitude: 1.25 V
Lead Channel Pacing Threshold Pulse Width: 0.5 ms
Lead Channel Pacing Threshold Pulse Width: 0.5 ms
Lead Channel Pacing Threshold Pulse Width: 0.5 ms
Lead Channel Pacing Threshold Pulse Width: 0.5 ms
Lead Channel Pacing Threshold Pulse Width: 1 ms
Lead Channel Pacing Threshold Pulse Width: 1 ms
Lead Channel Sensing Intrinsic Amplitude: 1.9 mV
Lead Channel Sensing Intrinsic Amplitude: 12 mV
Lead Channel Setting Pacing Amplitude: 2 V
Lead Channel Setting Pacing Amplitude: 2.5 V
Lead Channel Setting Pacing Amplitude: 2.75 V
Lead Channel Setting Pacing Pulse Width: 0.5 ms
Lead Channel Setting Pacing Pulse Width: 0.6 ms
Lead Channel Setting Sensing Sensitivity: 0.5 mV
Pulse Gen Serial Number: 9782720

## 2021-04-02 LAB — BASIC METABOLIC PANEL
BUN/Creatinine Ratio: 12 (ref 9–23)
BUN: 11 mg/dL (ref 6–24)
CO2: 22 mmol/L (ref 20–29)
Calcium: 8.7 mg/dL (ref 8.7–10.2)
Chloride: 109 mmol/L — ABNORMAL HIGH (ref 96–106)
Creatinine, Ser: 0.92 mg/dL (ref 0.57–1.00)
Glucose: 90 mg/dL (ref 65–99)
Potassium: 4.1 mmol/L (ref 3.5–5.2)
Sodium: 143 mmol/L (ref 134–144)
eGFR: 72 mL/min/{1.73_m2} (ref >59)

## 2021-04-02 MED ORDER — METOPROLOL TARTRATE 100 MG PO TABS: 100.0000 mg | ORAL_TABLET | Freq: Once | ORAL | 0 refills | Status: DC

## 2021-04-02 NOTE — Patient Instructions (Addendum)
Medication Instructions:   Your physician recommends that you continue on your current medications as directed. Please refer to the Current Medication list given to you today.   *If you need a refill on your cardiac medications before your next appointment, please call your pharmacy*   Lab Work:  BMET TODAY    If you have labs (blood work) drawn today and your tests are completely normal, you will receive your results only by: Marland Kitchen MyChart Message (if you have MyChart) OR . A paper copy in the mail If you have any lab test that is abnormal or we need to change your treatment, we will call you to review the results.   Testing/Procedures: Non-Cardiac CT Angiography (CTA), is a special type of CT scan that uses a computer to produce multi-dimensional views of major blood vessels throughout the body. In CT angiography, a contrast material is injected through an IV to help visualize the blood vessels   SEE BELOW FOR INSTRUCTIONS:  Follow-Up: At [facility], you and your health needs are our priority.  As part of our continuing mission to provide you with exceptional heart care, we have created designated Provider Care Teams.  These Care Teams include your primary Cardiologist (physician) and Advanced Practice Providers (APPs -  Physician Assistants and Nurse Practitioners) who all work together to provide you with the care you need, when you need it.  We recommend signing up for the patient portal called "MyChart".  Sign up information is provided on this After Visit Summary.  MyChart is used to connect with patients for Virtual Visits (Telemedicine).  Patients are able to view lab/test results, encounter notes, upcoming appointments, etc.  Non-urgent messages can be sent to your provider as well.   To learn more about what you can do with MyChart, go to [email or web address].    Your next appointment:   6 month(s)  The format for your next appointment:   In Person  Provider:   Sherryl Manges, MD   Other Instructions  Your cardiac CT will be scheduled at one of the below locations:   [facility] [address] [place], [place] [address] [phone]  OR  [facility] [address] [place], [place] [address] [phone]  If scheduled at [facility], please arrive at the [facility] main entrance (entrance A) of [facility] 30 minutes prior to test start time. Proceed to the [facility] Radiology Department (first floor) to check-in and test prep.  If scheduled at [facility], please arrive 15 mins early for check-in and test prep.  Please follow these instructions carefully (unless otherwise directed):   On the Night Before the Test: . Be sure to Drink plenty of water. . Do not consume any caffeinated/decaffeinated beverages or chocolate 12 hours prior to your test. . Do not take any antihistamines 12 hours prior to your test.   On the Day of the Test: . Drink plenty of water until 1 hour prior to the test. . Do not eat any food 4 hours prior to the test. . You may take your regular medications prior to the test.  . Take metoprolol (Lopressor) two hours prior to test. . HOLD Furosemide/Hydrochlorothiazide morning of the test. . FEMALES- please wear underwire-free bra if available        After the Test: . Drink plenty of water. . After receiving IV contrast, you may experience a mild flushed feeling. This is normal. .  On occasion, you may experience a mild rash up to 24 hours after the test. This is not dangerous. If this occurs, you can take Benadryl 25 mg and increase your fluid intake. . If you experience trouble breathing, this can be serious. If it is severe call 911 IMMEDIATELY. If it is mild, please call our office. . If you take any of these medications: Glipizide/Metformin, Avandament, Glucavance, please do not take 48 hours after completing  test unless otherwise instructed.   Once we have confirmed authorization from your insurance company, we will call you to set up a date and time for your test. Based on how quickly your insurance processes prior authorizations requests, please allow up to 4 weeks to be contacted for scheduling your Cardiac CT appointment. Be advised that routine Cardiac CT appointments could be scheduled as many as 8 weeks after your provider has ordered it.  For non-scheduling related questions, please contact the cardiac imaging nurse navigator should you have any questions/concerns: Rockwell [place], Cardiac Imaging Nurse Navigator Larey Brick, Cardiac Imaging Nurse Navigator [facility] Direct Office Dial: [phone]   For scheduling needs, including cancellations and rescheduling, please call [place], [phone].

## 2021-04-02 NOTE — Telephone Encounter (Signed)
Error

## 2021-04-03 ENCOUNTER — Telehealth: Payer: Self-pay

## 2021-04-03 NOTE — Telephone Encounter (Signed)
Called patient and she states she is going to look at her fathers house to see if her monitor is there and will call back this afternoon to let us know. If patient doesn't call back I will go ahead and order one for her

## 2021-04-03 NOTE — Telephone Encounter (Signed)
-----   Message from [facility], [place] sent at 04/02/2021  8:32 AM EDT ----- Patient says she never got the transmitter devlivered, any way we can follow up on that?  She has confirmed the correct address on file  thx

## 2021-04-03 NOTE — Telephone Encounter (Signed)
I ordered the patient a new monitor in [place] website.

## 2021-05-01 ENCOUNTER — Ambulatory Visit (HOSPITAL_COMMUNITY): Payer: Medicaid Other

## 2021-05-02 ENCOUNTER — Telehealth: Payer: Self-pay | Admitting: Cardiovascular Disease

## 2021-05-02 NOTE — Telephone Encounter (Signed)
Spoke with patient.  Device clinic called her.  See encounter 04/03/21 for details.

## 2021-05-02 NOTE — Telephone Encounter (Signed)
Patient called asking that a nurse give her a call back please advise

## 2021-05-02 NOTE — Telephone Encounter (Signed)
I spoke with the patient. She is out of town until next week.  She will send transmission at that time.

## 2021-05-02 NOTE — Telephone Encounter (Signed)
LMOVM for patient to return device clinic call.  I spoke with [employer] tech support and they states the patient monitor was delivered by [employer] on May 10 th at 9:30 am on the front door.   We need the patient to plug in the monitor and send a transmission to pair her device.

## 2021-05-19 ENCOUNTER — Ambulatory Visit: Payer: Medicaid Other | Admitting: Internal Medicine

## 2021-05-22 ENCOUNTER — Other Ambulatory Visit: Payer: Self-pay | Admitting: Emergency Medicine

## 2021-05-22 ENCOUNTER — Telehealth (HOSPITAL_COMMUNITY): Payer: Self-pay | Admitting: *Deleted

## 2021-05-22 ENCOUNTER — Other Ambulatory Visit (HOSPITAL_COMMUNITY): Payer: Self-pay | Admitting: *Deleted

## 2021-05-22 DIAGNOSIS — R079 Chest pain, unspecified: Secondary | ICD-10-CM

## 2021-05-22 NOTE — Telephone Encounter (Signed)
Pt returning call regarding upcoming cardiac imaging study; pt verbalizes understanding of appt date/time, parking situation and where to check in, pre-test NPO status and medications ordered; name and call back number provided for further questions should they arise  Larey Brick RN Navigator Cardiac Imaging [facility] [phone] office [phone] cell  Pt aware to get blood work prior cardiac CT and will take 100mg  metoprolol tartrate 2 hours prior to scan.

## 2021-05-22 NOTE — Telephone Encounter (Signed)
Attempted to call patient regarding upcoming cardiac CT appointment. °Left message on voicemail with name and callback number ° °Grayling Schranz RN Navigator Cardiac Imaging °[place] [facility] [facility] °[phone] Office °[phone] Cell ° °

## 2021-05-23 LAB — BASIC METABOLIC PANEL
BUN/Creatinine Ratio: 16 (ref 9–23)
BUN: 15 mg/dL (ref 6–24)
CO2: 23 mmol/L (ref 20–29)
Calcium: 9 mg/dL (ref 8.7–10.2)
Chloride: 111 mmol/L — ABNORMAL HIGH (ref 96–106)
Creatinine, Ser: 0.91 mg/dL (ref 0.57–1.00)
Glucose: 89 mg/dL (ref 65–99)
Potassium: 4.2 mmol/L (ref 3.5–5.2)
Sodium: 142 mmol/L (ref 134–144)
eGFR: 73 mL/min/{1.73_m2} (ref >59)

## 2021-05-26 ENCOUNTER — Ambulatory Visit (HOSPITAL_COMMUNITY)
Admission: RE | Admit: 2021-05-26 | Discharge: 2021-05-26 | Disposition: A | Discharge status: Home / Self Care | Payer: Medicaid Other | Source: Ambulatory Visit | Attending: Physician Assistant | Admitting: Physician Assistant

## 2021-05-26 ENCOUNTER — Other Ambulatory Visit: Payer: Self-pay

## 2021-05-26 ENCOUNTER — Encounter (HOSPITAL_COMMUNITY): Payer: Self-pay

## 2021-05-26 DIAGNOSIS — R079 Chest pain, unspecified: Secondary | ICD-10-CM | POA: Insufficient documentation

## 2021-05-26 MED ORDER — IOHEXOL 350 MG/ML SOLN
95.0000 mL | Freq: Once | INTRAVENOUS | Status: AC | PRN
  Administered 2021-05-26: 95 mL via INTRAVENOUS

## 2021-05-26 MED ORDER — NITROGLYCERIN 0.4 MG SL SUBL
0.8000 mg | SUBLINGUAL_TABLET | Freq: Once | SUBLINGUAL | Status: AC
  Administered 2021-05-26: 0.8 mg via SUBLINGUAL

## 2021-05-26 MED ORDER — NITROGLYCERIN 0.4 MG SL SUBL
SUBLINGUAL_TABLET | SUBLINGUAL | Status: AC
  Filled 2021-05-26: qty 2

## 2021-05-26 NOTE — Telephone Encounter (Signed)
Certified letter sent 05/26/2021. 

## 2021-05-29 ENCOUNTER — Telehealth (HOSPITAL_COMMUNITY): Payer: Self-pay | Admitting: *Deleted

## 2021-05-29 NOTE — Telephone Encounter (Signed)
Returning patient request to speak to RN related to possible contrast allergy.  Pt states that she has three spots on the back her arm where the IV was placed in addition to a bruise at the site of IV placement.  Pt denies any itching or pain.  Pt talking in complete sentences over the phone and denies any change to the quality of her voice.  Pt informed that it was not likely a reaction to contrast as she only noticed the spots today and her CT scan was on 6/27.  Pt was re-assured but encouraged patient to seek medical treatment as appropriate especially if she got worse. Patient verbalize understanding.  Larey Brick RN Navigator Cardiac Imaging [facility] [phone] Office [phone] Cell

## 2021-06-26 NOTE — Progress Notes (Signed)
Chief Complaint  Patient presents with   Follow-up    NICM     History of Present Illness: 60 yo female with history of non-ischemic cardiomyopathy, prior VF arrest August 2011, PAF here today for cardiac follow up. She was admitted to [facility] August 2011 after an out of hospital ventricular fibrillation arrest. No evidence of CAD by cath in 2011. Echo in 2011 showed LVEF=15%. Cardiac MRI in 2011 showed EF of 29%. Her cardiomyopathy was presumed to be non-ischemic, possibly from the history of cocaine abuse. She had an ICD implanted by Dr. Graciela Husbands on July 18, 2010. She has had chronic chest pain since her cardiac arrest. Echo August 2018 with LVEF=40-45%, grade 2 diastolic dysfunction, mild MR.  ICD generator change February 2019. She has refused to consider Entresto. I saw her in the office in November 2021 and she c/o occasional chest pains. I arranged a coronary CTA which was completed in June 2022 and showed no evidence of CAD, calcium score zero.   She is here today for follow up. The patient denies any chest pain, dyspnea, palpitations, lower extremity edema, orthopnea, PND, dizziness, near syncope or syncope. She did not like Lisinopril because it made her feel bad.    Primary Care Physician: [facility]  Past Medical History:  Diagnosis Date   Anemia, unspecified    Atrial fibrillation (HCC)    a. This occurred in setting of resuscitation efforts during VF arrest.   Automatic implantable cardiac defibrillator in situ    a. 06/2010. b. Gen chance in 10/2018.   Cardiac arrest [facility])    a. 06/2010   Chronic systolic heart failure (HCC)    CKD (chronic kidney disease), stage II    Depression    ok   GERD (gastroesophageal reflux disease)    History of cocaine abuse (HCC)    remote   History of tobacco abuse    Hypertension    ICD (implantable cardioverter-defibrillator) lead failure--noise of both leads 6/15 isolated 08/13/2015   Nonischemic cardiomyopathy  (HCC)    a. 06/2010 OOH VF arrest;  b. 06/2010 Cath: nonobs dzs, EF 15-20%, glob HK;  c. 06/2010 Echo: EF 15%, diff HK, Gr 2 DD;  d. 06/2010 Cardiac MRI EF of 29%, no scar;  e. 06/2010 s/p SJM 3231-40 Bi V ICD Ser # [id number].   Other left bundle branch block    Paroxysmal supraventricular tachycardia (HCC)    Thyroid disease     Past Surgical History:  Procedure Laterality Date   BIV ICD GENERATOR CHANGEOUT N/A 12/29/2017   Procedure: BIV ICD GENERATOR CHANGEOUT;  Surgeon: Duke Salvia, MD;  Location: [facility] INVASIVE CV LAB;  Service: Cardiovascular;  Laterality: N/A;   CARDIAC DEFIBRILLATOR PLACEMENT     St Jude Unify CD [phone] Q defib, serial number [id number]   CARDIAC DEFIBRILLATOR PLACEMENT     OPEN REDUCTION INTERNAL FIXATION (ORIF) DISTAL RADIAL FRACTURE Left 05/07/2014   Procedure: OPEN REDUCTION INTERNAL FIXATION (ORIF) DISTAL RADIAL FRACTURE;  Surgeon: Jodi Marble, MD;  Location: [facility] OR;  Service: Orthopedics;  Laterality: Left;    Current Outpatient Medications  Medication Sig Dispense Refill   albuterol (PROVENTIL HFA;VENTOLIN HFA) 108 (90 BASE) MCG/ACT inhaler Inhale 2 puffs into the lungs every 4 (four) hours as needed for wheezing or shortness of breath.      aspirin 81 MG chewable tablet Chew 81 mg by mouth daily.     carvedilol (COREG) 25 MG tablet TAKE 1 TABLET (25 MG  TOTAL) BY MOUTH 2 (TWO) TIMES DAILY WITH A MEAL. 180 tablet 3   diphenhydrAMINE (BENADRYL) 25 MG tablet Take 1 tablet (25 mg total) by mouth every 6 (six) hours as needed for itching (Rash). 30 tablet 0   furosemide (LASIX) 40 MG tablet TAKE 1 TABLET BY MOUTH EVERY DAY 90 tablet 3   hydrocortisone cream 1 % Apply 1 application topically as needed for itching.     ibuprofen (ADVIL,MOTRIN) 200 MG tablet Take 200-400 mg by mouth every 6 (six) hours as needed for headache or mild pain.      losartan (COZAAR) 25 MG tablet Take 1 tablet (25 mg total) by mouth daily. 90 tablet 3   methocarbamol (ROBAXIN) 500 MG tablet Take  500 mg by mouth daily as needed for muscle spasms.     Multiple Vitamin (MULTIVITAMIN WITH MINERALS) TABS Take 1 tablet by mouth daily.     potassium chloride SA (KLOR-CON M20) 20 MEQ tablet Take 1 tablet (20 mEq total) by mouth daily. 90 tablet 3   traMADol (ULTRAM) 50 MG tablet Take 1 tablet (50 mg total) by mouth every 6 (six) hours as needed. FOR PAIN 30 tablet 0   No current facility-administered medications for this visit.    No Known Allergies  Social History   Socioeconomic History   Marital status: Married    Spouse name: Not on file   Number of children: 4   Years of education: Not on file   Highest education level: Not on file  Occupational History   Occupation: Disablity  Tobacco Use   Smoking status: Former    Types: Cigarettes    Quit date: 07/28/2011    Years since quitting: 9.9   Smokeless tobacco: Never  Vaping Use   Vaping Use: Never used  Substance and Sexual Activity   Alcohol use: No   Drug use: No    Comment: history of cocaine abuse last used 2008   Sexual activity: Yes    Birth control/protection: I.U.D.  Other Topics Concern   Not on file  Social History Narrative   Not on file   Social Determinants of Health   Financial Resource Strain: Not on file  Food Insecurity: Not on file  Transportation Needs: Not on file  Physical Activity: Not on file  Stress: Not on file  Social Connections: Not on file  Intimate Partner Violence: Not on file    Family History  Problem Relation Age of Onset   Heart disease Father    Hypertension Father    Hypertension Mother    Heart disease Mother     Review of Systems:  As stated in the HPI and otherwise negative.   BP (!) 146/92   Pulse 76   Ht 5\' 3"  (1.6 m)   Wt 134 lb 12.8 oz (61.1 kg)   SpO2 97%   BMI 23.88 kg/m   Physical Examination:  General: Well developed, well nourished, NAD  HEENT: OP clear, mucus membranes moist  SKIN: warm, dry. No rashes. Neuro: No focal deficits   Musculoskeletal: Muscle strength 5/5 all ext  Psychiatric: Mood and affect normal  Neck: No JVD, no carotid bruits, no thyromegaly, no lymphadenopathy.  Lungs:Clear bilaterally, no wheezes, rhonci, crackles Cardiovascular: Regular rate and rhythm. No murmurs, gallops or rubs. Abdomen:Soft. Bowel sounds present. Non-tender.  Extremities: No lower extremity edema. Pulses are 2 + in the bilateral DP/PT.  Echo August 2018: Left ventricle: The cavity size was normal. Posterior wall   thickness was  increased in a pattern of mild LVH. Systolic   function was mildly to moderately reduced. The estimated ejection   fraction was in the range of 40% to 45%. Diffuse hypokinesis.   Features are consistent with a pseudonormal left ventricular   filling pattern, with concomitant abnormal relaxation and   increased filling pressure (grade 2 diastolic dysfunction).   Doppler parameters are consistent with indeterminate ventricular   filling pressure. - Mitral valve: Transvalvular velocity was within the normal range.   There was no evidence for stenosis. There was mild regurgitation. - Left atrium: The atrium was mildly dilated. - Right ventricle: The cavity size was normal. Wall thickness was   normal. Systolic function was normal. - Atrial septum: No defect or patent foramen ovale was identified   by color flow Doppler. - Tricuspid valve: There was mild regurgitation. - Pulmonary arteries: Systolic pressure was within the normal   range. PA peak pressure: 23 mm Hg (S).  EKG:  EKG is ordered today.  The EKG demonstrates sinus rhythm, V paced.   Recent Labs: 05/22/2021: BUN 15; Creatinine, Ser 0.91; Potassium 4.2; Sodium 142   Lipid Panel    Component Value Date/Time   CHOL 179 11/10/2012 1402   TRIG 97.0 11/10/2012 1402   HDL 34.80 (L) 11/10/2012 1402   CHOLHDL 5 11/10/2012 1402   VLDL 19.4 11/10/2012 1402   LDLCALC 125 (H) 11/10/2012 1402     Wt Readings from Last 3 Encounters:   06/27/21 134 lb 12.8 oz (61.1 kg)  04/02/21 138 lb (62.6 kg)  01/07/21 141 lb 12.8 oz (64.3 kg)     Other studies Reviewed: Additional studies/ records that were reviewed today include: ED records   Assessment and Plan:   1. Non ischemic cardiomyopathy: LVEF=40% by echo in 2018. ICD in place. We discussed adding Entresto in the past but she has not wished to make this change. Will continue Coreg. She refuses to take Lisinopril. She has not been on aldactone due to her poor medication compliance at times over the years. Will try Cozaar 25 mg daily.   2. Chronic systolic CHF: Weight is stable. No volume overload on exam. Continue Lasix  3. Atrial fib, paroxysmal: She is in sinus today on exam. Remote atrial fib. No occurrences in many years. She is not on anti-coagulation.   4. Substance abuse: In remission  5. Chest pain: She had no evidence of CAD on coronary CTA June 2022. Her pain is not felt to be cardiac related.    No orders of the defined types were placed in this encounter.   Disposition:   F/U with me in 12 months  Signed, Verne Carrow, MD 06/27/2021 9:28 AM    [facility] [address] [place], [place], [place]  [address] Phone: [phone]; Fax: [phone]

## 2021-06-27 ENCOUNTER — Ambulatory Visit (INDEPENDENT_AMBULATORY_CARE_PROVIDER_SITE_OTHER): Payer: Medicaid Other | Admitting: Cardiovascular Disease

## 2021-06-27 ENCOUNTER — Other Ambulatory Visit: Payer: Self-pay

## 2021-06-27 ENCOUNTER — Encounter: Payer: Self-pay | Admitting: Cardiovascular Disease

## 2021-06-27 VITALS — BP 146/92 | HR 76 | Ht 63.0 in | Wt 134.8 lb

## 2021-06-27 DIAGNOSIS — I428 Other cardiomyopathies: Secondary | ICD-10-CM

## 2021-06-27 DIAGNOSIS — I5022 Chronic systolic (congestive) heart failure: Secondary | ICD-10-CM | POA: Diagnosis not present

## 2021-06-27 MED ORDER — LOSARTAN POTASSIUM 25 MG PO TABS: 25.0000 mg | ORAL_TABLET | Freq: Every day | ORAL | 3 refills | Status: DC

## 2021-06-27 NOTE — Patient Instructions (Signed)
Medication Instructions:  Your physician has recommended you make the following change in your medication:  START:  losartan potassium (Cozaar) 25 mg by mouth daily  *If you need a refill on your cardiac medications before your next appointment, please call your pharmacy*   Lab Work: NONE If you have labs (blood work) drawn today and your tests are completely normal, you will receive your results only by: MyChart Message (if you have MyChart) OR A paper copy in the mail If you have any lab test that is abnormal or we need to change your treatment, we will call you to review the results.   Testing/Procedures: NONE   Follow-Up: At [facility], you and your health needs are our priority.  As part of our continuing mission to provide you with exceptional heart care, we have created designated Provider Care Teams.  These Care Teams include your primary Cardiologist (physician) and Advanced Practice Providers (APPs -  Physician Assistants and Nurse Practitioners) who all work together to provide you with the care you need, when you need it.  We recommend signing up for the patient portal called "MyChart".  Sign up information is provided on this After Visit Summary.  MyChart is used to connect with patients for Virtual Visits (Telemedicine).  Patients are able to view lab/test results, encounter notes, upcoming appointments, etc.  Non-urgent messages can be sent to your provider as well.   To learn more about what you can do with MyChart, go to [email or web address].    Your next appointment:   1 year(s)  The format for your next appointment:   In Person  Provider:   You may see Verne Carrow, MD or one of the following Advanced Practice Providers on your designated Care Team:   Ronie Spies, PA-C Jacolyn Reedy, PA-C

## 2021-07-24 ENCOUNTER — Ambulatory Visit: Payer: Medicaid Other | Admitting: Cardiovascular Disease

## 2021-07-30 ENCOUNTER — Other Ambulatory Visit: Payer: Self-pay | Admitting: Cardiovascular Disease

## 2021-10-28 ENCOUNTER — Telehealth: Payer: Self-pay | Admitting: Cardiovascular Disease

## 2021-10-28 NOTE — Telephone Encounter (Signed)
Call transferred in triage. Could not hear patient but call was still open. Will have device run a transmission.

## 2021-10-28 NOTE — Telephone Encounter (Signed)
Successful telephone call to patient who admits to currently having mid sternal chest pressure. This has been intermittent over the last week. Patient states "there is something going on". Patient feels it may be food related but unsure. No other symptoms noted currently. Out of abundance of precautions, patient is advised to go to the ED. Hx NICM, LBBB, VF, and chronic systolic HF. Patient agreeable. Patient does not do remote transmissions. Will continue to follow.

## 2021-10-28 NOTE — Telephone Encounter (Signed)
Pt c/o of Chest Pain: STAT if CP now or developed within 24 hours  1. Are you having CP right now? yes  2. Are you experiencing any other symptoms (ex. SOB, nausea, vomiting, sweating)? Irregular heart beat, SOB, yesterday was sweating but not sure if it was because it was warm  3. How long have you been experiencing CP? About a week  4. Is your CP continuous or coming and going? Comes and goes  5. Have you taken Nitroglycerin? No   Patient states she has chest tightness between her breasts. She says it has been going on about a week and will ease up and then come back. She says she has also had irregular heart beats and gets SOB.   ?

## 2021-11-24 NOTE — Progress Notes (Signed)
Cardiology Office Note Date:  11/26/2021  Patient ID:  Angela Payne, Angela Payne May 29, 1961, MRN [id number] PCP:  [facility]  Cardiologist:  Dr. [name] Electrophysiologist: Dr. Graciela Husbands  Chief Complaint:  follow up  History of Present Illness: Angela Payne is a 60 y.o. female with history of NICM, VF arrest, (Afib post arrest), HTN, LBBB, chronic CHF (systolic), CKD (II).  She comes in today to be seen for Dr. Graciela Husbands.  Last seen by EP service by A. [place], [name] Sept 2020 for pre-op (dental) evaluation.  Planned to continue in-clinic device checks Q 7mo, and annually with MD/APP.  Most recently saw Dr. [name] Nov 2021, discussed chronic CP since her arrest in 2011, historically declined Entresto.   Planned for CTA to evaluate further.  Appears despite numerous efforts by the office the patient never scheduled her CT and no showed to her  f/u APP visit.  Telephone notes with pt c/o palpitations, not feeling well.  Last device check 10/03/20 notes false AMS 2/2 A lead noise, known for her.  NO SHOWED to her annual provider visit   I saw her feb 2022 She is doing well. Tells me she is exercising and feels really good when she exercises regularly. She does say that she has had that same CP since her arrest back in 2011, she was scared to have the CT Dr. [name] ordered because of the dye. She denies any palpitations, no dizzy spells, near syncope or syncope. No SOB or DOE, no nocturnal symptoms  Initially planned for coronary calcium score though unable to afford thew cost, remained hesitant to do CT angio Discussed remote monitoring benefits, she was agreeable, d/w device clinic to have monitor ordered and sent to start remotes. Labs ordered but not done   I saw her 04/02/2021 She is doing well, continues to exercise at home, walks and bikes. Has good exertional capacity C/w the random L sided chest aching that she has mentioned in the past, again, not new and unchanged  behavior No unusual SOB or DOE No symptoms of orthopnea or PND No near syncope or syncope. She is worried about her battery running out or the device rusting. Worries about the state of the world and the possibility of not having access to health care down the road. She was very concerned about her CP, discussed chronic atypical nature though she remained quite worried and planned for Coronary CT  She saw Dr. [name] July 2022, discussed coronary CT with no CAD/Ca zero.  Historically had declined Entresto and not taking lisinopril, planned to try Losartan, prior noncompliance with aldactone also discussed, not felt to be volume OL.  CP not felt to be cardiac.  She called 10/28/21 with c/o CP to the office and referred to the ER, though I do not see an ED visit noted.  TODAY She is doing well She did not start the losartan, mentions she has told Dr. [name] and all her doctors that she does not want any new medicines. She also does not want to do remote monitoring, she brought her transmitters and does not want them in her house.  She has not had more CP and in hind sight thinks was muscular having started to do push ups to try and improve tone of her upper arms.  No near syncope or syncope No palitationos No SOB   Device History: St. Jude BiV ICD implanted originally 06/2010,  gen change 12/29/2017 secondary prevention Known to have noise on her A  lead  History of appropriate therapy: No History of AAD therapy: No   Past Medical History:  Diagnosis Date   Anemia, unspecified    Atrial fibrillation ([place])    a. This occurred in setting of resuscitation efforts during VF arrest.   Automatic implantable cardiac defibrillator in situ    a. 06/2010. b. Gen chance in 10/2018.   Cardiac arrest [facility])    a. 99991111   Chronic systolic heart failure (HCC)    CKD (chronic kidney disease), stage II    Depression    ok   GERD (gastroesophageal reflux disease)    History of cocaine abuse  ([place])    remote   History of tobacco abuse    Hypertension    ICD (implantable cardioverter-defibrillator) lead failure--noise of both leads 6/15 isolated 08/13/2015   Nonischemic cardiomyopathy ([place])    a. 06/2010 OOH VF arrest;  b. 06/2010 Cath: nonobs dzs, EF 15-20%, glob HK;  c. 06/2010 Echo: EF 15%, diff HK, Gr 2 DD;  d. 06/2010 Cardiac MRI EF of 29%, no scar;  e. 06/2010 s/p SJM 3231-40 Bi V ICD Ser # [id number].   Other left bundle branch block    Paroxysmal supraventricular tachycardia ([place])    Thyroid disease     Past Surgical History:  Procedure Laterality Date   BIV ICD GENERATOR CHANGEOUT N/A 12/29/2017   Procedure: BIV ICD GENERATOR CHANGEOUT;  Surgeon: Deboraha Sprang, MD;  Location: [place] CV LAB;  Service: Cardiovascular;  Laterality: N/A;   CARDIAC DEFIBRILLATOR PLACEMENT     St Jude Unify CD [phone] Q defib, serial number [id number]   CARDIAC DEFIBRILLATOR PLACEMENT     OPEN REDUCTION INTERNAL FIXATION (ORIF) DISTAL RADIAL FRACTURE Left 05/07/2014   Procedure: OPEN REDUCTION INTERNAL FIXATION (ORIF) DISTAL RADIAL FRACTURE;  Surgeon: Jolyn Nap, MD;  Location: [place];  Service: Orthopedics;  Laterality: Left;    Current Outpatient Medications  Medication Sig Dispense Refill   albuterol (PROVENTIL HFA;VENTOLIN HFA) 108 (90 BASE) MCG/ACT inhaler Inhale 2 puffs into the lungs every 4 (four) hours as needed for wheezing or shortness of breath.      aspirin 81 MG chewable tablet Chew 81 mg by mouth daily.     diphenhydrAMINE (BENADRYL) 25 MG tablet Take 1 tablet (25 mg total) by mouth every 6 (six) hours as needed for itching (Rash). 30 tablet 0   hydrocortisone cream 1 % Apply 1 application topically as needed for itching.     ibuprofen (ADVIL,MOTRIN) 200 MG tablet Take 200-400 mg by mouth every 6 (six) hours as needed for headache or mild pain.      KLOR-CON M20 20 MEQ tablet TAKE 1 TABLET BY MOUTH EVERY DAY 90 tablet 3   methocarbamol (ROBAXIN) 500 MG tablet Take 500 mg by mouth  daily as needed for muscle spasms.     Multiple Vitamin (MULTIVITAMIN WITH MINERALS) TABS Take 1 tablet by mouth daily.     traMADol (ULTRAM) 50 MG tablet Take 1 tablet (50 mg total) by mouth every 6 (six) hours as needed. FOR PAIN 30 tablet 0   carvedilol (COREG) 25 MG tablet Take 1 tablet (25 mg total) by mouth 2 (two) times daily with a meal. 180 tablet 3   furosemide (LASIX) 40 MG tablet Take 1 tablet (40 mg total) by mouth daily. 90 tablet 3   No current facility-administered medications for this visit.    Allergies:   Patient has no known allergies.   Social History:  The patient  reports that she quit smoking about 10 years ago. Her smoking use included cigarettes. She has never used smokeless tobacco. She reports that she does not drink alcohol and does not use drugs.   Family History:  The patient's family history includes Heart disease in her father and mother; Hypertension in her father and mother.  ROS:  Please see the history of present illness.    All other systems are reviewed and otherwise negative.   PHYSICAL EXAM:  VS:  BP 118/78    Pulse 64    Ht 5\' 3"  (1.6 m)    Wt 130 lb 12.8 oz (59.3 kg)    SpO2 97%    BMI 23.17 kg/m  BMI: Body mass index is 23.17 kg/m. Well nourished, well developed, in no acute distress, looks younger then her age HEENT: normocephalic, atraumatic Neck: no JVD, carotid bruits or masses Cardiac:  RRR; no significant murmurs, no rubs, or gallops Lungs:  CTA b/l, no wheezing, rhonchi or rales Abd: soft, nontender MS: no deformity or atrophy Ext: no edema Skin: warm and dry, no rash Neuro:  No gross deficits appreciated Psych: euthymic mood, full affect  ICD site is stable, no tethering or discomfort   EKG:  Not done today   Device interrogation done today and reviewed by myself:  Battery and lead measurements are good She has known A lead noise, though sensing, impedance and thresholds are stable She has had 2 true AF episodes, one was  just over an hour duration Burden INCLUDING FALSE EPISODES FOR NOISE is still <1% One SVT episode treated in the VT zone with ATP worked, EGM c/w 1:1 tachycardia, morphology c/w SVT   05/26/21: Coronary CTa IMPRESSION: 1. No evidence of CAD, CADRADS = 0. 2. Coronary calcium score of 0. This was 0 percentile for age and sex matched control. 3. Normal coronary origin with right dominance. 4. Consider non-coronary causes of chest pain  Radiology over read IMPRESSION: No significant incidental findings.  07/13/2017: TTE Study Conclusions  - Left ventricle: The cavity size was normal. Posterior wall    thickness was increased in a pattern of mild LVH. Systolic    function was mildly to moderately reduced. The estimated ejection    fraction was in the range of 40% to 45%. Diffuse hypokinesis.    Features are consistent with a pseudonormal left ventricular    filling pattern, with concomitant abnormal relaxation and    increased filling pressure (grade 2 diastolic dysfunction).    Doppler parameters are consistent with indeterminate ventricular    filling pressure.  - Mitral valve: Transvalvular velocity was within the normal range.    There was no evidence for stenosis. There was mild regurgitation.  - Left atrium: The atrium was mildly dilated.  - Right ventricle: The cavity size was normal. Wall thickness was    normal. Systolic function was normal.  - Atrial septum: No defect or patent foramen ovale was identified    by color flow Doppler.  - Tricuspid valve: There was mild regurgitation.  - Pulmonary arteries: Systolic pressure was within the normal    range. PA peak pressure: 23 mm Hg (S).    Recent Labs: 05/22/2021: BUN 15; Creatinine, Ser 0.91; Potassium 4.2; Sodium 142  No results found for requested labs within last 8760 hours.   CrCl cannot be calculated (Patient's most recent lab result is older than the maximum 21 days allowed.).   Wt Readings from Last 3 Encounters:   11/26/21 130 lb 12.8  oz (59.3 kg)  06/27/21 134 lb 12.8 oz (61.1 kg)  04/02/21 138 lb (62.6 kg)     Other studies reviewed: Additional studies/records reviewed today include: summarized above  ASSESSMENT AND PLAN:  1. ICD    Known A lead noise    Stable function otherwise, no programming changes made  2. NICM 3. Chronic CHF (systolic/diastolic)     Last echo 99991111, LVEF 40-45%, grade II DD     CorVue is down, though again no exam findings or symptoms of volume OL  4. H/o aborted cardiac arrest 2011     No CAD at the time by cath     No V arrhytmias  5. Chronic atypical CP     No CAD    6. HTN    No changes  7. SCAF Burden is quite low, I discussed the finding with her We discussed importance of remote monitoring that can help [place] monitor the burden of her arrhythmia, monitor leads/deice function, status She is clear No remote monitoring No new medicines at least for now, should her arrhythmia burden increase, she might consider a change, but not now        Disposition: will have her back in 54mo, sooner if needed  Current medicines are reviewed at length with the patient today.  The patient did not have any concerns regarding medicines.  Venetia Night, PA-C 11/26/2021 10:39 AM     [facility] [place] [id number] [phone] (office)  [phone] (fax)

## 2021-11-26 ENCOUNTER — Ambulatory Visit (INDEPENDENT_AMBULATORY_CARE_PROVIDER_SITE_OTHER): Payer: Medicaid Other | Admitting: Physician Assistant

## 2021-11-26 ENCOUNTER — Encounter: Payer: Self-pay | Admitting: Physician Assistant

## 2021-11-26 ENCOUNTER — Other Ambulatory Visit: Payer: Self-pay

## 2021-11-26 VITALS — BP 118/78 | HR 64 | Ht 63.0 in | Wt 130.8 lb

## 2021-11-26 DIAGNOSIS — I10 Essential (primary) hypertension: Secondary | ICD-10-CM

## 2021-11-26 DIAGNOSIS — I428 Other cardiomyopathies: Secondary | ICD-10-CM

## 2021-11-26 DIAGNOSIS — Z95810 Presence of automatic (implantable) cardiac defibrillator: Secondary | ICD-10-CM | POA: Diagnosis not present

## 2021-11-26 DIAGNOSIS — I469 Cardiac arrest, cause unspecified: Secondary | ICD-10-CM

## 2021-11-26 DIAGNOSIS — I480 Paroxysmal atrial fibrillation: Secondary | ICD-10-CM

## 2021-11-26 LAB — CUP PACEART INCLINIC DEVICE CHECK
Battery Remaining Longevity: 30 mo
Brady Statistic RA Percent Paced: 2.8 %
Brady Statistic RV Percent Paced: 98 %
Date Time Interrogation Session: 20221228104709
HighPow Impedance: 38.1094
Implantable Lead Implant Date: 20110819
Implantable Lead Implant Date: 20110819
Implantable Lead Implant Date: 20110819
Implantable Lead Location: 753858
Implantable Lead Location: 753859
Implantable Lead Location: 753860
Implantable Lead Model: 7121
Implantable Pulse Generator Implant Date: 20190130
Lead Channel Impedance Value: 262.5 Ohm
Lead Channel Impedance Value: 487.5 Ohm
Lead Channel Impedance Value: 687.5 Ohm
Lead Channel Pacing Threshold Amplitude: 0.5 V
Lead Channel Pacing Threshold Amplitude: 0.5 V
Lead Channel Pacing Threshold Amplitude: 1 V
Lead Channel Pacing Threshold Amplitude: 1 V
Lead Channel Pacing Threshold Amplitude: 1.75 V
Lead Channel Pacing Threshold Amplitude: 1.75 V
Lead Channel Pacing Threshold Pulse Width: 0.5 ms
Lead Channel Pacing Threshold Pulse Width: 0.5 ms
Lead Channel Pacing Threshold Pulse Width: 0.5 ms
Lead Channel Pacing Threshold Pulse Width: 0.5 ms
Lead Channel Pacing Threshold Pulse Width: 0.6 ms
Lead Channel Pacing Threshold Pulse Width: 0.6 ms
Lead Channel Sensing Intrinsic Amplitude: 12 mV
Lead Channel Sensing Intrinsic Amplitude: 2.2 mV
Lead Channel Setting Pacing Amplitude: 2 V
Lead Channel Setting Pacing Amplitude: 2.5 V
Lead Channel Setting Pacing Amplitude: 2.75 V
Lead Channel Setting Pacing Pulse Width: 0.5 ms
Lead Channel Setting Pacing Pulse Width: 0.6 ms
Lead Channel Setting Sensing Sensitivity: 0.5 mV
Pulse Gen Serial Number: 9782720

## 2021-11-26 MED ORDER — FUROSEMIDE 40 MG PO TABS: 40.0000 mg | ORAL_TABLET | Freq: Every day | ORAL | 3 refills | Status: DC

## 2021-11-26 MED ORDER — CARVEDILOL 25 MG PO TABS: 25.0000 mg | ORAL_TABLET | Freq: Two times a day (BID) | ORAL | 3 refills | Status: DC

## 2021-11-26 NOTE — Patient Instructions (Signed)
Medication Instructions:   Your physician recommends that you continue on your current medications as directed. Please refer to the Current Medication list given to you today.  *If you need a refill on your cardiac medications before your next appointment, please call your pharmacy*   Lab Work: NONE ORDERED  TODAY   If you have labs (blood work) drawn today and your tests are completely normal, you will receive your results only by: MyChart Message (if you have MyChart) OR A paper copy in the mail If you have any lab test that is abnormal or we need to change your treatment, we will call you to review the results.   Testing/Procedures: NONE ORDERED  TODAY     Follow-Up: At [facility], you and your health needs are our priority.  As part of our continuing mission to provide you with exceptional heart care, we have created designated Provider Care Teams.  These Care Teams include your primary Cardiologist (physician) and Advanced Practice Providers (APPs -  Physician Assistants and Nurse Practitioners) who all work together to provide you with the care you need, when you need it.  We recommend signing up for the patient portal called "MyChart".  Sign up information is provided on this After Visit Summary.  MyChart is used to connect with patients for Virtual Visits (Telemedicine).  Patients are able to view lab/test results, encounter notes, upcoming appointments, etc.  Non-urgent messages can be sent to your provider as well.   To learn more about what you can do with MyChart, go to [email or web address].    Your next appointment:   6 month(s)  The format for your next appointment:   In Person  Provider:   You may see Dr. Klein or one of the following Advanced Practice Providers on your designated Care Team:   Renee Ursuy, PA-C Michael "Andy" Tillery, PA-C   Other Instructions  

## 2021-12-28 ENCOUNTER — Other Ambulatory Visit: Payer: Self-pay

## 2021-12-28 ENCOUNTER — Emergency Department (HOSPITAL_COMMUNITY): Payer: Medicaid Other

## 2021-12-28 ENCOUNTER — Emergency Department (HOSPITAL_COMMUNITY)
Admission: EM | Admit: 2021-12-28 | Discharge: 2021-12-28 | Disposition: A | Discharge status: Home / Self Care | Payer: Medicaid Other | Attending: Emergency Medicine | Admitting: Emergency Medicine

## 2021-12-28 ENCOUNTER — Encounter (HOSPITAL_COMMUNITY): Payer: Self-pay | Admitting: Emergency Medicine

## 2021-12-28 DIAGNOSIS — I509 Heart failure, unspecified: Secondary | ICD-10-CM | POA: Diagnosis not present

## 2021-12-28 DIAGNOSIS — Z7982 Long term (current) use of aspirin: Secondary | ICD-10-CM | POA: Diagnosis not present

## 2021-12-28 DIAGNOSIS — Z7951 Long term (current) use of inhaled steroids: Secondary | ICD-10-CM | POA: Diagnosis not present

## 2021-12-28 DIAGNOSIS — Y712 Prosthetic and other implants, materials and accessory cardiovascular devices associated with adverse incidents: Secondary | ICD-10-CM | POA: Diagnosis not present

## 2021-12-28 DIAGNOSIS — T82118A Breakdown (mechanical) of other cardiac electronic device, initial encounter: Secondary | ICD-10-CM | POA: Diagnosis present

## 2021-12-28 DIAGNOSIS — T829XXA Unspecified complication of cardiac and vascular prosthetic device, implant and graft, initial encounter: Secondary | ICD-10-CM

## 2021-12-28 DIAGNOSIS — Z95810 Presence of automatic (implantable) cardiac defibrillator: Secondary | ICD-10-CM

## 2021-12-28 LAB — CBC
HCT: 44.3 % (ref 36.0–46.0)
Hemoglobin: 13.8 g/dL (ref 12.0–15.0)
MCH: 26.5 pg (ref 26.0–34.0)
MCHC: 31.2 g/dL (ref 30.0–36.0)
MCV: 85.2 fL (ref 80.0–100.0)
Platelets: 229 10*3/uL (ref 150–400)
RBC: 5.2 MIL/uL — ABNORMAL HIGH (ref 3.87–5.11)
RDW: 15.2 % (ref 11.5–15.5)
WBC: 7 10*3/uL (ref 4.0–10.5)
nRBC: 0 % (ref 0.0–0.2)

## 2021-12-28 LAB — BASIC METABOLIC PANEL
Anion gap: 6 (ref 5–15)
BUN: 15 mg/dL (ref 6–20)
CO2: 24 mmol/L (ref 22–32)
Calcium: 8.9 mg/dL (ref 8.9–10.3)
Chloride: 110 mmol/L (ref 98–111)
Creatinine, Ser: 1 mg/dL (ref 0.44–1.00)
GFR, Estimated: >60 mL/min (ref >60)
Glucose, Bld: 102 mg/dL — ABNORMAL HIGH (ref 70–99)
Potassium: 4.4 mmol/L (ref 3.5–5.1)
Sodium: 140 mmol/L (ref 135–145)

## 2021-12-28 LAB — TROPONIN I (HIGH SENSITIVITY)
Troponin I (High Sensitivity): 4 ng/L (ref <18)
Troponin I (High Sensitivity): 5 ng/L (ref <18)

## 2021-12-28 LAB — I-STAT BETA HCG BLOOD, ED (MC, WL, AP ONLY): I-stat hCG, quantitative: <5 m[IU]/mL (ref <5)

## 2021-12-28 NOTE — Discharge Instructions (Signed)
Please contact Dr. Claris Gladden office tomorrow morning to request follow-up appointment.  If your device fires, you develop any difficulty breathing, chest pain, episodes of lightheadedness or passing out, come back to ER for reassessment.

## 2021-12-28 NOTE — ED Provider Notes (Signed)
[facility] EMERGENCY DEPARTMENT Provider Note   CSN: [id number] Arrival date & time: 12/28/21  1621     History  Chief Complaint  Patient presents with   AICD Problem    Angela Payne is a 61 y.o. female.  Presented to ER with concern for AICD problem.  States that over the last days she has noted brief episodes of humming/vibrating sensation from her device.  Her husband at bedside states that he noted an episode while she was sleeping.  She denies any medical complaint at present, states that she feels completely fine.  Has not had any recent device firing.  She was unaware of any complications with her device.  No lightheadedness or passing out episodes.    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 She was admitted to [facility] August 2011 after an out of hospital ventricular fibrillation arrest. No evidence of CAD by cath in 2011. Echo in 2011 showed LVEF=15%. Cardiac MRI in 2011 showed EF of 29%. Her cardiomyopathy was presumed to be non-ischemic, possibly from the history of cocaine abuse. She had an ICD implanted by Dr. Caryl Comes on July 18, 2010. She has had chronic chest pain since her cardiac arrest. Echo August 2018 with [id number], grade 2 diastolic dysfunction, mild MR.  ICD generator change February 2019. She has refused to consider Entresto. I saw her in the office in November 2021 and she c/o occasional chest pains. I arranged a coronary CTA which was completed in June 2022 and showed no evidence of CAD, calcium score zero.   HPI     Home Medications Prior to Admission medications   Medication Sig Start Date End Date Taking? Authorizing Provider  albuterol (PROVENTIL HFA;VENTOLIN HFA) 108 (90 BASE) MCG/ACT inhaler Inhale 2 puffs into the lungs every 4 (four) hours as needed for wheezing or shortness  of breath.     [provider]  aspirin 81 MG chewable tablet Chew 81 mg by mouth daily.    [provider]  carvedilol (COREG) 25 MG tablet Take 1 tablet (25 mg total) by mouth 2 (two) times daily with a meal. 11/26/21   Baldwin Jamaica, PA-C  diphenhydrAMINE (BENADRYL) 25 MG tablet Take 1 tablet (25 mg total) by mouth every 6 (six) hours as needed for itching (Rash). 05/27/13   Antonietta Breach, PA-C  furosemide (LASIX) 40 MG tablet Take 1 tablet (40 mg total) by mouth daily. 11/26/21   Baldwin Jamaica, PA-C  hydrocortisone cream 1 % Apply 1 application topically as needed for itching.    [provider]  ibuprofen (ADVIL,MOTRIN) 200 MG tablet Take 200-400 mg by mouth every 6 (six) hours as needed for headache or mild pain.     [provider]  KLOR-CON M20 20 MEQ tablet TAKE 1 TABLET BY MOUTH EVERY DAY 07/30/21   Burnell Blanks, MD  methocarbamol (ROBAXIN) 500 MG tablet Take 500 mg by mouth daily as needed for muscle spasms.    [provider]  Multiple Vitamin (MULTIVITAMIN WITH MINERALS) TABS Take 1 tablet by mouth daily.    [provider]  traMADol (ULTRAM) 50 MG tablet Take 1 tablet (50 mg total) by mouth every 6 (six) hours as needed. FOR PAIN 01/07/14   Dorie Rank, MD      Allergies    Patient has no known allergies.  Review of Systems   Review of Systems  All other systems reviewed and are negative.  Physical Exam Updated Vital Signs BP 112/71 (BP Location: Right Arm)    Pulse 69    Temp 98.2 F (36.8 C) (Oral)    Resp 14    SpO2 100%  Physical Exam Vitals and nursing note reviewed.  Constitutional:      General: She is not in acute distress.    Appearance: She is well-developed.  HENT:     Head: Normocephalic and atraumatic.  Eyes:     Conjunctiva/sclera: Conjunctivae normal.  Cardiovascular:     Rate and Rhythm: Normal rate and regular rhythm.     Heart sounds: No murmur heard. Pulmonary:     Effort:  Pulmonary effort is normal. No respiratory distress.     Breath sounds: Normal breath sounds.  Abdominal:     Palpations: Abdomen is soft.     Tenderness: There is no abdominal tenderness.  Musculoskeletal:        General: No swelling.     Cervical back: Neck supple.  Skin:    General: Skin is warm and dry.     Capillary Refill: Capillary refill takes less than 2 seconds.  Neurological:     Mental Status: She is alert.  Psychiatric:        Mood and Affect: Mood normal.    ED Results / Procedures / Treatments   Labs (all labs ordered are listed, but only abnormal results are displayed) Labs Reviewed  BASIC METABOLIC PANEL - Abnormal; Notable for the following components:      Result Value   Glucose, Bld 102 (*)    All other components within normal limits  CBC - Abnormal; Notable for the following components:   RBC 5.20 (*)    All other components within normal limits  I-STAT BETA HCG BLOOD, ED ([facility], [facility], [facility] ONLY)  TROPONIN I (HIGH SENSITIVITY)  TROPONIN I (HIGH SENSITIVITY)    EKG EKG Interpretation  Date/Time:  Sunday December 28 2021 16:18:48 EST Ventricular Rate:  153 PR Interval:  144 QRS Duration: 132 QT Interval:  126 QTC Calculation: 201 R Axis:   251 Text Interpretation: AV PACED RHYTHM no significant change from prior Confirmed by Madalyn Rob [phone]) on 12/28/2021 4:37:44 PM  Radiology DG Chest 2 View  Result Date: 12/28/2021 CLINICAL DATA:  Chest pain. EXAM: CHEST - 2 VIEW COMPARISON:  05/07/2014 FINDINGS: Heart size is within normal limits. AICD remains in appropriate position. Aortic atherosclerotic calcification noted. Both lungs are well aerated and clear. IMPRESSION: No active cardiopulmonary disease. Electronically Signed   By: Marlaine Hind M.D.   On: 12/28/2021 17:04    Procedures Procedures    Medications Ordered in ED Medications - No data to display  ED Course/ Medical Decision Making/ A&P                           Medical Decision  Making Amount and/or Complexity of Data Reviewed Labs: ordered. Radiology: ordered.   61 year old lady with remote history of VF arrest, heart failure s/p ICD placement, 2019 generator changed presented to ER with concern for humming/vibrating episodes from the device.  Patient has no other medical complaint at present and she appears well.  Basic lab work is grossly stable, no electrolyte derangement or anemia.  EKG appears unchanged from baseline.  Troponin within normal limits.  Doubt ACS.  Device rep came and evaluated device in person  and interrogated.  No device firing.  Impedance parameters are out of the range.  Reports that the device is operating well and patient is safe for discharge from his standpoint.  I advised patient to follow-up with her cardiology clinic to discuss this in greater detail.  Given the report from the device rep and patient's lack of any ongoing medical complaint and her normal vital signs, feel she can be discharged and follow-up in outpatient setting and does not require admission for any further monitoring.  Additional hx obtained from review of old cardiology notes, discussion with significant other at bedside. Independently reviewed CXR. No acute findings.         Final Clinical Impression(s) / ED Diagnoses Final diagnoses:  AICD problem    Rx / DC Orders ED Discharge Orders     None         Lucrezia Starch, MD 12/29/21 1300

## 2021-12-28 NOTE — ED Triage Notes (Signed)
Reports defibrillator firing since yesterday and mild SOB.  Reports intermittent chest pain since having defib placed.  Denies chest pain at present.  States she can here defib buzzing.

## 2021-12-28 NOTE — ED Notes (Signed)
Rep from [facility] called and is going to come in to assess AICD.  Reports that patient has some parameters that are out of the limit and wants to make sure it is not malfunctioning.

## 2021-12-30 ENCOUNTER — Other Ambulatory Visit: Payer: Self-pay | Admitting: Cardiovascular Disease

## 2022-01-18 NOTE — Progress Notes (Unsigned)
Cardiology Office Note Date:  01/18/2022  Patient ID:  Angela, Payne 09/01/1961, MRN [id number] PCP:  [facility]  Cardiologist:  Dr. Angelena Form Electrophysiologist: Dr. Caryl Comes  Chief Complaint:  *** post ER  History of Present Illness: Angela Payne is a 61 y.o. female with history of NICM, VF arrest, (Afib post arrest), HTN, LBBB, chronic CHF (systolic), CKD (II).  She comes in today to be seen for Dr. Caryl Comes.  Last seen by EP service by A. [place], [place] Sept 2020 for pre-op (dental) evaluation.  Planned to continue in-clinic device checks Q 34mo, and annually with MD/APP.  Most recently saw Dr. Angelena Form Nov 2021, discussed chronic CP since her arrest in 2011, historically declined Entresto.   Planned for CTA to evaluate further.  Appears despite numerous efforts by the office the patient never scheduled her CT and no showed to her  f/u APP visit.  Telephone notes with pt c/o palpitations, not feeling well.  Last device check 10/03/20 notes false AMS 2/2 A lead noise, known for her.  NO SHOWED to her annual provider visit   I saw her feb 2022 She is doing well. Tells me she is exercising and feels really good when she exercises regularly. She does say that she has had that same CP since her arrest back in 2011, she was scared to have the CT Dr. Angelena Form ordered because of the dye. She denies any palpitations, no dizzy spells, near syncope or syncope. No SOB or DOE, no nocturnal symptoms  Initially planned for coronary calcium score though unable to afford thew cost, remained hesitant to do CT angio Discussed remote monitoring benefits, she was agreeable, d/w device clinic to have monitor ordered and sent to start remotes. Labs ordered but not done   I saw her 04/02/2021 She is doing well, continues to exercise at home, walks and bikes. Has good exertional capacity C/w the random L sided chest aching that she has mentioned in the past, again, not new and unchanged  behavior No unusual SOB or DOE No symptoms of orthopnea or PND No near syncope or syncope. She is worried about her battery running out or the device rusting. Worries about the state of the world and the possibility of not having access to health care down the road. She was very concerned about her CP, discussed chronic atypical nature though she remained quite worried and planned for Coronary CT  She saw Dr. Angelena Form July 2022, discussed coronary CT with no CAD/Ca zero.  Historically had declined Entresto and not taking lisinopril, planned to try Losartan, prior noncompliance with aldactone also discussed, not felt to be volume OL.  CP not felt to be cardiac.  She called 10/28/21 with c/o CP to the office and referred to the ER, though I do not see an ED visit noted.  I saw her 11/26/21 She is doing well She did not start the losartan, mentions she has told Dr. Angelena Form and all her doctors that she does not want any new medicines. She also does not want to do remote monitoring, she brought her transmitters and does not want them in her house. She has not had more CP and in hind sight thinks was muscular having started to do push ups to try and improve tone of her upper arms. No near syncope or syncope No palitationos No SOB  Device check noted: known A lead noise, though sensing, impedance and thresholds are stable She has had 2 true AF episodes,  one was just over an hour duration Burden INCLUDING FALSE EPISODES FOR NOISE is still <1% One SVT episode treated in the VT zone with ATP worked, EGM c/w 1:1 tachycardia, morphology c/w SVT  We discussed some true AF, and A lead noise, and role of remote monitoring though she was firm, she did not want to enrol in remote monitoring or make any medication changes at the ti,e.  She had an ER visit 1/29 for vibrating at her device.  By notes:  Device rep came and evaluated device in person and interrogated.  No device firing.  Impedance parameters  are out of the range.  Reports that the device is operating well and patient is safe for discharge from his standpoint  *** device impedance??? *** AF burden *** symptoms   Device History: St. Jude BiV ICD implanted originally 06/2010,  gen change 12/29/2017 secondary prevention Known to have noise on her A lead  History of appropriate therapy: No History of AAD therapy: No   Past Medical History:  Diagnosis Date   Anemia, unspecified    Atrial fibrillation ([place])    a. This occurred in setting of resuscitation efforts during VF arrest.   Automatic implantable cardiac defibrillator in situ    a. 06/2010. b. Gen chance in 10/2018.   Cardiac arrest [facility])    a. [id number]   Chronic systolic heart failure (HCC)    CKD (chronic kidney disease), stage II    Depression    ok   GERD (gastroesophageal reflux disease)    History of cocaine abuse ([place])    remote   History of tobacco abuse    Hypertension    ICD (implantable cardioverter-defibrillator) lead failure--noise of both leads 6/15 isolated 08/13/2015   Nonischemic cardiomyopathy ([place])    a. 06/2010 OOH VF arrest;  b. 06/2010 Cath: nonobs dzs, EF 15-20%, glob HK;  c. 06/2010 Echo: EF 15%, diff HK, Gr 2 DD;  d. 06/2010 Cardiac MRI EF of 29%, no scar;  e. 06/2010 s/p SJM 3231-40 Bi V ICD Ser # [id number].   Other left bundle branch block    Paroxysmal supraventricular tachycardia ([place])    Thyroid disease     Past Surgical History:  Procedure Laterality Date   BIV ICD GENERATOR CHANGEOUT N/A 12/29/2017   Procedure: BIV ICD GENERATOR CHANGEOUT;  Surgeon: Deboraha Sprang, MD;  Location: [facility];  Service: Cardiovascular;  Laterality: N/A;   CARDIAC DEFIBRILLATOR PLACEMENT     St Jude Unify CD [id number] Q defib, serial number [id number]   CARDIAC DEFIBRILLATOR PLACEMENT     CARDIAC DEFIBRILLATOR PLACEMENT     OPEN REDUCTION INTERNAL FIXATION (ORIF) DISTAL RADIAL FRACTURE Left 05/07/2014   Procedure: OPEN REDUCTION INTERNAL FIXATION (ORIF)  DISTAL RADIAL FRACTURE;  Surgeon: Jolyn Nap, MD;  Location: [facility];  Service: Orthopedics;  Laterality: Left;    Current Outpatient Medications  Medication Sig Dispense Refill   albuterol (PROVENTIL HFA;VENTOLIN HFA) 108 (90 BASE) MCG/ACT inhaler Inhale 2 puffs into the lungs every 4 (four) hours as needed for wheezing or shortness of breath.      aspirin 81 MG chewable tablet Chew 81 mg by mouth daily.     carvedilol (COREG) 25 MG tablet TAKE 1 TABLET (25 MG TOTAL) BY MOUTH 2 (TWO) TIMES DAILY WITH A MEAL. 180 tablet 2   diphenhydrAMINE (BENADRYL) 25 MG tablet Take 1 tablet (25 mg total) by mouth every 6 (six) hours as needed for itching (Rash). 30 tablet 0  furosemide (LASIX) 40 MG tablet Take 1 tablet (40 mg total) by mouth daily. 90 tablet 3   hydrocortisone cream 1 % Apply 1 application topically as needed for itching.     ibuprofen (ADVIL,MOTRIN) 200 MG tablet Take 200-400 mg by mouth every 6 (six) hours as needed for headache or mild pain.      KLOR-CON M20 20 MEQ tablet TAKE 1 TABLET BY MOUTH EVERY DAY 90 tablet 3   methocarbamol (ROBAXIN) 500 MG tablet Take 500 mg by mouth daily as needed for muscle spasms.     Multiple Vitamin (MULTIVITAMIN WITH MINERALS) TABS Take 1 tablet by mouth daily.     traMADol (ULTRAM) 50 MG tablet Take 1 tablet (50 mg total) by mouth every 6 (six) hours as needed. FOR PAIN 30 tablet 0   No current facility-administered medications for this visit.    Allergies:   Patient has no known allergies.   Social History:  The patient  reports that she quit smoking about 10 years ago. Her smoking use included cigarettes. She has never used smokeless tobacco. She reports that she does not drink alcohol and does not use drugs.   Family History:  The patient's family history includes Heart disease in her father and mother; Hypertension in her father and mother.  ROS:  Please see the history of present illness.    All other systems are reviewed and otherwise  negative.   PHYSICAL EXAM:  VS:  There were no vitals taken for this visit. BMI: There is no height or weight on file to calculate BMI. Well nourished, well developed, in no acute distress, looks younger then her age 68: normocephalic, atraumatic Neck: no JVD, carotid bruits or masses Cardiac:  *** RRR; no significant murmurs, no rubs, or gallops Lungs:  *** CTA b/l, no wheezing, rhonchi or rales Abd: soft, nontender MS: no deformity or atrophy Ext: *** no edema Skin: warm and dry, no rash Neuro:  No gross deficits appreciated Psych: euthymic mood, full affect  *** ICD site is stable, no tethering or discomfort   EKG:  Not done today   Device interrogation done today and reviewed by myself:  ***   05/26/21: Coronary CTa IMPRESSION: 1. No evidence of CAD, CADRADS = 0. 2. Coronary calcium score of 0. This was 0 percentile for age and sex matched control. 3. Normal coronary origin with right dominance. 4. Consider non-coronary causes of chest pain  Radiology over read IMPRESSION: No significant incidental findings.  07/13/2017: TTE Study Conclusions  - Left ventricle: The cavity size was normal. Posterior wall    thickness was increased in a pattern of mild LVH. Systolic    function was mildly to moderately reduced. The estimated ejection    fraction was in the range of 40% to 45%. Diffuse hypokinesis.    Features are consistent with a pseudonormal left ventricular    filling pattern, with concomitant abnormal relaxation and    increased filling pressure (grade 2 diastolic dysfunction).    Doppler parameters are consistent with indeterminate ventricular    filling pressure.  - Mitral valve: Transvalvular velocity was within the normal range.    There was no evidence for stenosis. There was mild regurgitation.  - Left atrium: The atrium was mildly dilated.  - Right ventricle: The cavity size was normal. Wall thickness was    normal. Systolic function was normal.  -  Atrial septum: No defect or patent foramen ovale was identified    by color flow Doppler.  -  Tricuspid valve: There was mild regurgitation.  - Pulmonary arteries: Systolic pressure was within the normal    range. PA peak pressure: 23 mm Hg (S).    Recent Labs: 12/28/2021: BUN 15; Creatinine, Ser 1.00; Hemoglobin 13.8; Platelets 229; Potassium 4.4; Sodium 140  No results found for requested labs within last 8760 hours.   CrCl cannot be calculated (Unknown ideal weight.).   Wt Readings from Last 3 Encounters:  11/26/21 130 lb 12.8 oz (59.3 kg)  06/27/21 134 lb 12.8 oz (61.1 kg)  04/02/21 138 lb (62.6 kg)     Other studies reviewed: Additional studies/records reviewed today include: summarized above  ASSESSMENT AND PLAN:  1. ICD    Known A lead noise    ***  2. NICM 3. Chronic CHF (systolic/diastolic)     Last echo 99991111, LVEF 40-45%, grade II DD     ***  4. H/o aborted cardiac arrest 2011     No CAD at the time by cath     *** No V arrhytmias  5. Chronic atypical CP     *** No CAD    6. HTN    *** No changes  7. SCAF *** Burden is quite low,  We previously discussed the finding with her As well as the importance of remote monitoring that can help [place] monitor the burden of her arrhythmia, monitor leads/device function, status She has been clear No remote monitoring No new medicines at least for now, should her arrhythmia burden increase, she might consider a change.        Disposition: ***  Current medicines are reviewed at length with the patient today.  The patient did not have any concerns regarding medicines.  Venetia Night, PA-C 01/18/2022 3:38 PM     [address] [place] [address] [phone] (office)  [phone] (fax)

## 2022-01-22 ENCOUNTER — Ambulatory Visit: Payer: Medicaid Other | Admitting: Physician Assistant

## 2022-02-23 ENCOUNTER — Other Ambulatory Visit: Payer: Self-pay

## 2022-02-23 ENCOUNTER — Ambulatory Visit (INDEPENDENT_AMBULATORY_CARE_PROVIDER_SITE_OTHER): Payer: Medicaid Other

## 2022-02-23 DIAGNOSIS — I4901 Ventricular fibrillation: Secondary | ICD-10-CM

## 2022-02-23 LAB — CUP PACEART INCLINIC DEVICE CHECK
Battery Remaining Longevity: 27 mo
Brady Statistic RA Percent Paced: 3.2 %
Brady Statistic RV Percent Paced: 98 %
Date Time Interrogation Session: 20230327135342
HighPow Impedance: 42.4736
Implantable Lead Implant Date: 20110819
Implantable Lead Implant Date: 20110819
Implantable Lead Implant Date: 20110819
Implantable Lead Location: 753858
Implantable Lead Location: 753859
Implantable Lead Location: 753860
Implantable Lead Model: 7121
Implantable Pulse Generator Implant Date: 20190130
Lead Channel Impedance Value: 262.5 Ohm
Lead Channel Impedance Value: 525 Ohm
Lead Channel Impedance Value: 625 Ohm
Lead Channel Pacing Threshold Amplitude: 0.5 V
Lead Channel Pacing Threshold Amplitude: 0.5 V
Lead Channel Pacing Threshold Amplitude: 1 V
Lead Channel Pacing Threshold Amplitude: 1 V
Lead Channel Pacing Threshold Amplitude: 1.5 V
Lead Channel Pacing Threshold Amplitude: 1.5 V
Lead Channel Pacing Threshold Pulse Width: 0.5 ms
Lead Channel Pacing Threshold Pulse Width: 0.5 ms
Lead Channel Pacing Threshold Pulse Width: 0.5 ms
Lead Channel Pacing Threshold Pulse Width: 0.5 ms
Lead Channel Pacing Threshold Pulse Width: 0.6 ms
Lead Channel Pacing Threshold Pulse Width: 0.6 ms
Lead Channel Sensing Intrinsic Amplitude: 12 mV
Lead Channel Sensing Intrinsic Amplitude: 2.3 mV
Lead Channel Setting Pacing Amplitude: 2 V
Lead Channel Setting Pacing Amplitude: 2.5 V
Lead Channel Setting Pacing Amplitude: 2.75 V
Lead Channel Setting Pacing Pulse Width: 0.5 ms
Lead Channel Setting Pacing Pulse Width: 0.6 ms
Lead Channel Setting Sensing Sensitivity: 0.5 mV
Pulse Gen Serial Number: 9782720

## 2022-02-23 NOTE — Progress Notes (Signed)
Patient seen in device clinic today for ICD alarming. ICD check in clinic. Thresholds and sensing consistent with previous device measurements. Impedance trends stable over time. HV lead impedance less than lower limit for approix  readings according to report of <20 ohms.  AT/AF burden <1%, EGM's appear RA noise, not a new finding for patient. No ventricular arrhythmias. Histogram distribution appropriate for patient and level of activity. No changes made this session. Device programmed at appropriate safety margins. Device programmed to optimize intrinsic conduction. BIV pacing 98%. Estimated longevity 2.3 years. Pt encouraged to enroll in remote monitoring. Patient education completed including shock plan. Auditory/vibratory alert demonstrated. Alarm tone reset for HV wire.  ? ?Consulted with Bryan from [place]. Jude and appears possible insulation break between can & SVC coil. Dynamic TX Algorithm enabled which is a safety feature to identify HV lead integrity.  ? ?Judie Grieve stated if you Dr. Graciela Husbands want to contact him, feel free. I reviewed this with him so he is familiar.  ? ? ? ?

## 2022-03-05 NOTE — Progress Notes (Signed)
? ?Chief Complaint  ?Patient presents with  ? Follow-up  ?  NICM  ? ? ?History of Present Illness: 61 yo female with history of non-ischemic cardiomyopathy, prior VF arrest August 2011, PAF here today for cardiac follow up. She was admitted to [facility] August 2011 after an out of hospital ventricular fibrillation arrest. No evidence of CAD by cath in 2011. Echo in 2011 showed LVEF=15%. Cardiac MRI in 2011 showed EF of 29%. Her cardiomyopathy was presumed to be non-ischemic, possibly from the history of cocaine abuse. She had an ICD implanted by Dr. Caryl Comes on July 18, 2010. She has had chronic chest pain since her cardiac arrest. Echo August 2018 with [id number], grade 2 diastolic dysfunction, mild MR.  ICD generator change February 2019. She has refused to consider Entresto. I saw her in the office in November 2021 and she c/o occasional chest pains. I arranged a coronary CTA which was completed in June 2022 and showed no evidence of CAD, calcium score zero. She has not wished to take Lisinopril.  ? ?She is here today for follow up. The patient denies any exertional chest pain, dyspnea, palpitations, lower extremity edema, orthopnea, PND, dizziness, near syncope or syncope. She had slight left sided chest pain last week after working out. The  pain is resolved now.  ?  ?Primary Care Physician: [facility] [place] ? ?Past Medical History:  ?Diagnosis Date  ? Anemia, unspecified   ? Atrial fibrillation ([place])   ? a. This occurred in setting of resuscitation efforts during VF arrest.  ? Automatic implantable cardiac defibrillator in situ   ? a. 06/2010. b. Gen chance in 10/2018.  ? Cardiac arrest [facility])   ? a. 06/2010  ? Chronic systolic heart failure ([place])   ? CKD (chronic kidney disease), stage II   ? Depression   ? ok  ? GERD (gastroesophageal reflux disease)   ? History of cocaine abuse ([place])   ? remote  ? History of tobacco abuse   ? Hypertension   ? ICD (implantable cardioverter-defibrillator) lead  failure--noise of both leads 6/15 isolated 08/13/2015  ? Nonischemic cardiomyopathy ([place])   ? a. 06/2010 OOH VF arrest;  b. 06/2010 Cath: nonobs dzs, EF 15-20%, glob HK;  c. 06/2010 Echo: EF 15%, diff HK, Gr 2 DD;  d. 06/2010 Cardiac MRI EF of 29%, no scar;  e. 06/2010 s/p SJM 3231-40 Bi V ICD Ser # [id number].  ? Other left bundle branch block   ? Paroxysmal supraventricular tachycardia ([place])   ? Thyroid disease   ? ? ?Past Surgical History:  ?Procedure Laterality Date  ? BIV ICD GENERATOR CHANGEOUT N/A 12/29/2017  ? Procedure: BIV ICD GENERATOR CHANGEOUT;  Surgeon: Deboraha Sprang, MD;  Location: [facility];  Service: Cardiovascular;  Laterality: N/A;  ? CARDIAC DEFIBRILLATOR PLACEMENT    ? [place] [id number] Q defib, serial number [id number]  ? CARDIAC DEFIBRILLATOR PLACEMENT    ? CARDIAC DEFIBRILLATOR PLACEMENT    ? OPEN REDUCTION INTERNAL FIXATION (ORIF) DISTAL RADIAL FRACTURE Left 05/07/2014  ? Procedure: OPEN REDUCTION INTERNAL FIXATION (ORIF) DISTAL RADIAL FRACTURE;  Surgeon: Jolyn Nap, MD;  Location: [facility];  Service: Orthopedics;  Laterality: Left;  ? ? ?Current Outpatient Medications  ?Medication Sig Dispense Refill  ? albuterol (PROVENTIL HFA;VENTOLIN HFA) 108 (90 BASE) MCG/ACT inhaler Inhale 2 puffs into the lungs every 4 (four) hours as needed for wheezing or shortness of breath.     ? aspirin 81 MG chewable  tablet Chew 81 mg by mouth daily.    ? carvedilol (COREG) 25 MG tablet TAKE 1 TABLET (25 MG TOTAL) BY MOUTH 2 (TWO) TIMES DAILY WITH A MEAL. 180 tablet 2  ? diphenhydrAMINE (BENADRYL) 25 MG tablet Take 1 tablet (25 mg total) by mouth every 6 (six) hours as needed for itching (Rash). 30 tablet 0  ? furosemide (LASIX) 40 MG tablet Take 1 tablet (40 mg total) by mouth daily. 90 tablet 3  ? hydrocortisone cream 1 % Apply 1 application topically as needed for itching.    ? ibuprofen (ADVIL,MOTRIN) 200 MG tablet Take 200-400 mg by mouth every 6 (six) hours as needed for headache or mild pain.     ?  KLOR-CON M20 20 MEQ tablet TAKE 1 TABLET BY MOUTH EVERY DAY 90 tablet 3  ? methocarbamol (ROBAXIN) 500 MG tablet Take 500 mg by mouth daily as needed for muscle spasms.    ? Multiple Vitamin (MULTIVITAMIN WITH MINERALS) TABS Take 1 tablet by mouth daily.    ? traMADol (ULTRAM) 50 MG tablet Take 1 tablet (50 mg total) by mouth every 6 (six) hours as needed. FOR PAIN 30 tablet 0  ? ?No current facility-administered medications for this visit.  ? ? ?No Known Allergies ? ?Social History  ? ?Socioeconomic History  ? Marital status: Married  ?  Spouse name: Not on file  ? Number of children: 4  ? Years of education: Not on file  ? Highest education level: Not on file  ?Occupational History  ? Occupation: Disablity  ?Tobacco Use  ? Smoking status: Former  ?  Types: Cigarettes  ?  Quit date: 07/28/2011  ?  Years since quitting: 10.6  ? Smokeless tobacco: Never  ?Vaping Use  ? Vaping Use: Never used  ?Substance and Sexual Activity  ? Alcohol use: No  ? Drug use: No  ?  Comment: history of cocaine abuse last used 2008  ? Sexual activity: Yes  ?  Birth control/protection: I.U.D.  ?Other Topics Concern  ? Not on file  ?Social History Narrative  ? Not on file  ? ?Social Determinants of Health  ? ?Financial Resource Strain: Not on file  ?Food Insecurity: Not on file  ?Transportation Needs: Not on file  ?Physical Activity: Not on file  ?Stress: Not on file  ?Social Connections: Not on file  ?Intimate Partner Violence: Not on file  ? ? ?Family History  ?Problem Relation Age of Onset  ? Heart disease Father   ? Hypertension Father   ? Hypertension Mother   ? Heart disease Mother   ? ? ?Review of Systems:  As stated in the HPI and otherwise negative.  ? ?BP 124/80   Pulse 60   Ht 5\' 3"  (1.6 m)   Wt 128 lb 6.4 oz (58.2 kg)   SpO2 98%   BMI 22.75 kg/m?  ? ?Physical Examination: ? ?General: Well developed, well nourished, NAD  ?HEENT: OP clear, mucus membranes moist  ?SKIN: warm, dry. No rashes. ?Neuro: No focal deficits   ?Musculoskeletal: Muscle strength 5/5 all ext  ?Psychiatric: Mood and affect normal  ?Neck: No JVD, no carotid bruits, no thyromegaly, no lymphadenopathy.  ?Lungs:Clear bilaterally, no wheezes, rhonci, crackles ?Cardiovascular: Regular rate and rhythm. No murmurs, gallops or rubs. ?Abdomen:Soft. Bowel sounds present. Non-tender.  ?Extremities: No lower extremity edema. Pulses are 2 + in the bilateral DP/PT. ? ?Echo August 2018: ?Left ventricle: The cavity size was normal. Posterior wall ?  thickness was increased in  a pattern of mild LVH. Systolic ?  function was mildly to moderately reduced. The estimated ejection ?  fraction was in the range of 40% to 45%. Diffuse hypokinesis. ?  Features are consistent with a pseudonormal left ventricular ?  filling pattern, with concomitant abnormal relaxation and ?  increased filling pressure (grade 2 diastolic dysfunction). ?  Doppler parameters are consistent with indeterminate ventricular ?  filling pressure. ?- Mitral valve: Transvalvular velocity was within the normal range. ?  There was no evidence for stenosis. There was mild regurgitation. ?- Left atrium: The atrium was mildly dilated. ?- Right ventricle: The cavity size was normal. Wall thickness was ?  normal. Systolic function was normal. ?- Atrial septum: No defect or patent foramen ovale was identified ?  by color flow Doppler. ?- Tricuspid valve: There was mild regurgitation. ?- Pulmonary arteries: Systolic pressure was within the normal ?  range. PA peak pressure: 23 mm Hg (S). ? ?EKG:  EKG is not ordered today.  ?The EKG demonstrates  ? ?Recent Labs: ?12/28/2021: BUN 15; Creatinine, Ser 1.00; Hemoglobin 13.8; Platelets 229; Potassium 4.4; Sodium 140  ? ?Lipid Panel ?   ?Component Value Date/Time  ? CHOL 179 11/10/2012 1402  ? TRIG 97.0 11/10/2012 1402  ? HDL 34.80 (L) 11/10/2012 1402  ? CHOLHDL 5 11/10/2012 1402  ? VLDL 19.4 11/10/2012 1402  ? LDLCALC 125 (H) 11/10/2012 1402  ? ?  ?Wt Readings from Last 3  Encounters:  ?03/06/22 128 lb 6.4 oz (58.2 kg)  ?11/26/21 130 lb 12.8 oz (59.3 kg)  ?06/27/21 134 lb 12.8 oz (61.1 kg)  ?  ? ?Other studies Reviewed: ?Additional studies/ records that were reviewed today include: ED records

## 2022-03-06 ENCOUNTER — Ambulatory Visit (INDEPENDENT_AMBULATORY_CARE_PROVIDER_SITE_OTHER): Payer: Medicaid Other | Admitting: Cardiovascular Disease

## 2022-03-06 ENCOUNTER — Encounter: Payer: Self-pay | Admitting: Cardiovascular Disease

## 2022-03-06 VITALS — BP 124/80 | HR 60 | Ht 63.0 in | Wt 128.4 lb

## 2022-03-06 DIAGNOSIS — I428 Other cardiomyopathies: Secondary | ICD-10-CM | POA: Diagnosis not present

## 2022-03-06 DIAGNOSIS — I5022 Chronic systolic (congestive) heart failure: Secondary | ICD-10-CM | POA: Diagnosis not present

## 2022-03-06 DIAGNOSIS — I480 Paroxysmal atrial fibrillation: Secondary | ICD-10-CM | POA: Diagnosis not present

## 2022-03-06 NOTE — Patient Instructions (Signed)
Medication Instructions:  ?Your physician recommends that you continue on your current medications as directed. Please refer to the Current Medication list given to you today. ? ?*If you need a refill on your cardiac medications before your next appointment, please call your pharmacy* ? ? ?Lab Work: ?None ?If you have labs (blood work) drawn today and your tests are completely normal, you will receive your results only by: ?MyChart Message (if you have MyChart) OR ?A paper copy in the mail ?If you have any lab test that is abnormal or we need to change your treatment, we will call you to review the results. ? ? ?Testing/Procedures: ?None ? ? ?Follow-Up: ?At [facility], you and your health needs are our priority.  As part of our continuing mission to provide you with exceptional heart care, we have created designated Provider Care Teams.  These Care Teams include your primary Cardiologist (physician) and Advanced Practice Providers (APPs -  Physician Assistants and Nurse Practitioners) who all work together to provide you with the care you need, when you need it. ? ?We recommend signing up for the patient portal called "MyChart".  Sign up information is provided on this After Visit Summary.  MyChart is used to connect with patients for Virtual Visits (Telemedicine).  Patients are able to view lab/test results, encounter notes, upcoming appointments, etc.  Non-urgent messages can be sent to your provider as well.   ?To learn more about what you can do with MyChart, go to [email or web address].   ? ?Your next appointment:   ?1 year(s) ? ?The format for your next appointment:   ?In Person ? ?Provider:   ?Christopher McAlhany, MD  ? ? ?Other Instructions ?  ?

## 2022-03-17 ENCOUNTER — Telehealth: Payer: Self-pay | Admitting: *Deleted

## 2022-03-17 NOTE — Telephone Encounter (Signed)
? ?  Patient Name: Angela Payne  ?DOB: 03-May-1961 ?MRN: [id number] ? ?Primary Cardiologist: Verne Carrow, MD ? ?Chart reviewed as part of pre-operative protocol coverage. Patient was just recently seen by Dr. [name] 03/06/22 at which time she did note some slight left sided chest pain that had resolved. Otherwise has hx of prior VF arrest, prior cocaine abuse, ICD, NICM, chronic systolic CHF, paroxysmal atrial fib, no evidence of CAD on coronary CTA 04/2021. Given recent symptom, will need MD input on whether OK to proceed with 11 dental extractions under general anesthesia and holding aspirin. Dr. [name] - Please route response to P CV DIV PREOP (the pre-op pool). Thank you. ? ?No specific SBE needs identified. ? ?Laurann Montana, PA-C ?03/17/2022, 12:49 PM ? ? ?

## 2022-03-17 NOTE — Telephone Encounter (Signed)
? ?  Pre-operative Risk Assessment  ?  ?Patient Name: Angela Payne  ?DOB: 09/28/1961 ?MRN: [id number]  ? ? ? ?Request for Surgical Clearance   ? ?Procedure:  Dental Extraction - Amount of Teeth to be Pulled:  11 TEETH TO BE EXTRACTED ? ?Date of Surgery:  Clearance TBD                              ?   ?Surgeon:  DR. Ocie Doyne, DMD ?Surgeon's Group or Practice Name:  Ocie Doyne, DMD ?Phone number:  [phone] ?Fax number:  [phone] ?  ?Type of Clearance Requested:   ?- Medical  ?- Pharmacy:  Hold Aspirin   ?  ?Type of Anesthesia:  General  ?  ?Additional requests/questions:   ? ?Signed, ?Danielle Rankin   ?03/17/2022, 11:56 AM  ? ? ? ?

## 2022-03-18 NOTE — Telephone Encounter (Signed)
? ?  Name: Angela Payne  ?DOB: 03/05/1961  ?MRN: [id number]  ? ?Primary Cardiologist: Verne Carrow, MD ? ?Chart reviewed as part of pre-operative protocol coverage. We have been asked for preoperative risk stratification and ASA hold for 11 dental extractions.  ? ?Per Dr. [name]: OK to proceed without additional cardiovascular testing. OK to hold ASA 5-7 days prior to procedure, resume after when safe to do so per surgeon. ? ?I will route this recommendation to the requesting party via Epic fax function and remove from pre-op pool. Please call with questions. ? ?Marcelino Duster, PA ?03/18/2022, 9:39 AM ? ?

## 2022-04-01 ENCOUNTER — Ambulatory Visit: Payer: Medicaid Other | Admitting: Internal Medicine

## 2022-04-21 ENCOUNTER — Ambulatory Visit: Payer: Medicaid Other | Admitting: Student

## 2022-04-21 DIAGNOSIS — R079 Chest pain, unspecified: Secondary | ICD-10-CM

## 2022-04-21 DIAGNOSIS — I480 Paroxysmal atrial fibrillation: Secondary | ICD-10-CM

## 2022-04-21 DIAGNOSIS — I4901 Ventricular fibrillation: Secondary | ICD-10-CM

## 2022-04-21 DIAGNOSIS — I5022 Chronic systolic (congestive) heart failure: Secondary | ICD-10-CM

## 2022-05-05 NOTE — Progress Notes (Unsigned)
Electrophysiology Office Note Date: 05/06/2022  ID:  Kyli, Kwon [birth year], MRN [id number]  PCP: [facility] [place] [facility] Primary Cardiologist: Lauree Chandler, MD Electrophysiologist: Virl Axe, MD   CC: Routine ICD follow-up  Angela Payne is a 61 y.o. female seen today for Virl Axe, MD for routine electrophysiology followup.  Since last being seen in our clinic the patient reports doing well.  she denies chest pain, palpitations, dyspnea, PND, orthopnea, nausea, vomiting, dizziness, syncope, edema, weight gain, or early satiety. He has not had ICD shocks.   Device History: St. Jude BiV ICD implanted originally 06/2010,  gen change 12/29/2017 secondary prevention Known to have noise on her A lead   History of appropriate therapy: No History of AAD therapy: No  Past Medical History:  Diagnosis Date   Anemia, unspecified    Atrial fibrillation ([place])    a. This occurred in setting of resuscitation efforts during VF arrest.   Automatic implantable cardiac defibrillator in situ    a. 06/2010. b. Gen chance in 10/2018.   Cardiac arrest [facility])    a. [id number]   Chronic systolic heart failure ([facility])    CKD (chronic kidney disease), stage II    Depression    ok   GERD (gastroesophageal reflux disease)    History of cocaine abuse ([place])    remote   History of tobacco abuse    Hypertension    ICD (implantable cardioverter-defibrillator) lead failure--noise of both leads 6/15 isolated 08/13/2015   Nonischemic cardiomyopathy ([place])    a. 06/2010 OOH VF arrest;  b. 06/2010 Cath: nonobs dzs, EF 15-20%, glob HK;  c. 06/2010 Echo: EF 15%, diff HK, Gr 2 DD;  d. 06/2010 Cardiac MRI EF of 29%, no scar;  e. 06/2010 s/p SJM 3231-40 Bi V ICD Ser # [id number].   Other left bundle branch block    Paroxysmal supraventricular tachycardia ([place])    Thyroid disease    Past Surgical History:  Procedure Laterality Date   BIV ICD GENERATOR CHANGEOUT N/A 12/29/2017   Procedure: BIV ICD GENERATOR  CHANGEOUT;  Surgeon: Deboraha Sprang, MD;  Location: [facility];  Service: Cardiovascular;  Laterality: N/A;   CARDIAC DEFIBRILLATOR PLACEMENT     St Jude [employer] CD [phone] Q defib, serial number [id number]   CARDIAC DEFIBRILLATOR PLACEMENT     CARDIAC DEFIBRILLATOR PLACEMENT     OPEN REDUCTION INTERNAL FIXATION (ORIF) DISTAL RADIAL FRACTURE Left 05/07/2014   Procedure: OPEN REDUCTION INTERNAL FIXATION (ORIF) DISTAL RADIAL FRACTURE;  Surgeon: Jolyn Nap, MD;  Location: [facility];  Service: Orthopedics;  Laterality: Left;    Current Outpatient Medications  Medication Sig Dispense Refill   albuterol (PROVENTIL HFA;VENTOLIN HFA) 108 (90 BASE) MCG/ACT inhaler Inhale 2 puffs into the lungs every 4 (four) hours as needed for wheezing or shortness of breath.      aspirin 81 MG chewable tablet Chew 81 mg by mouth daily.     carvedilol (COREG) 25 MG tablet TAKE 1 TABLET (25 MG TOTAL) BY MOUTH 2 (TWO) TIMES DAILY WITH A MEAL. 180 tablet 2   diphenhydrAMINE (BENADRYL) 25 MG tablet Take 1 tablet (25 mg total) by mouth every 6 (six) hours as needed for itching (Rash). 30 tablet 0   furosemide (LASIX) 40 MG tablet Take 1 tablet (40 mg total) by mouth daily. 90 tablet 3   hydrocortisone cream 1 % Apply 1 application topically as needed for itching.     ibuprofen (ADVIL,MOTRIN) 200 MG tablet Take 200-400  mg by mouth every 6 (six) hours as needed for headache or mild pain.      KLOR-CON M20 20 MEQ tablet TAKE 1 TABLET BY MOUTH EVERY DAY 90 tablet 3   methocarbamol (ROBAXIN) 500 MG tablet Take 500 mg by mouth daily as needed for muscle spasms.     Multiple Vitamin (MULTIVITAMIN WITH MINERALS) TABS Take 1 tablet by mouth daily.     traMADol (ULTRAM) 50 MG tablet Take 1 tablet (50 mg total) by mouth every 6 (six) hours as needed. FOR PAIN 30 tablet 0   No current facility-administered medications for this visit.    Allergies:   Patient has no known allergies.   Social History: Social History    Socioeconomic History   Marital status: Married    Spouse name: Not on file   Number of children: 4   Years of education: Not on file   Highest education level: Not on file  Occupational History   Occupation: Disablity  Tobacco Use   Smoking status: Former    Types: Cigarettes    Quit date: 07/28/2011    Years since quitting: 10.7   Smokeless tobacco: Never  Vaping Use   Vaping Use: Never used  Substance and Sexual Activity   Alcohol use: No   Drug use: No    Comment: history of cocaine abuse last used 2008   Sexual activity: Yes    Birth control/protection: I.U.D.  Other Topics Concern   Not on file  Social History Narrative   Not on file   Social Determinants of Health   Financial Resource Strain: Not on file  Food Insecurity: Not on file  Transportation Needs: Not on file  Physical Activity: Not on file  Stress: Not on file  Social Connections: Not on file  Intimate Partner Violence: Not on file    Family History: Family History  Problem Relation Age of Onset   Heart disease Father    Hypertension Father    Hypertension Mother    Heart disease Mother     Review of Systems: All other systems reviewed and are otherwise negative except as noted above.   Physical Exam: Vitals:   05/06/22 0907  BP: 118/74  Pulse: 69  SpO2: 97%  Weight: 121 lb (54.9 kg)  Height: 5\' 3"  (1.6 m)     GEN- The patient is well appearing, alert and oriented x 3 today.   HEENT: normocephalic, atraumatic; sclera clear, conjunctiva pink; hearing intact; oropharynx clear; neck supple, no JVP Lymph- no cervical lymphadenopathy Lungs- Clear to ausculation bilaterally, normal work of breathing.  No wheezes, rales, rhonchi Heart- Regular rate and rhythm, no murmurs, rubs or gallops, PMI not laterally displaced GI- soft, non-tender, non-distended, bowel sounds present, no hepatosplenomegaly Extremities- no clubbing or cyanosis. No edema; DP/PT/radial pulses 2+ bilaterally MS- no  significant deformity or atrophy Skin- warm and dry, no rash or lesion; ICD pocket well healed Psych- euthymic mood, full affect Neuro- strength and sensation are intact  ICD interrogation- reviewed in detail today,  See PACEART report  EKG:  EKG is not ordered today. Personal review of EKG ordered 12/28/2021 shows AS-VP at ~ 66 bpm  Recent Labs: 12/28/2021: BUN 15; Creatinine, Ser 1.00; Hemoglobin 13.8; Platelets 229; Potassium 4.4; Sodium 140   Wt Readings from Last 3 Encounters:  05/06/22 121 lb (54.9 kg)  03/06/22 128 lb 6.4 oz (58.2 kg)  11/26/21 130 lb 12.8 oz (59.3 kg)     Other studies Reviewed: Additional studies/ records  that were reviewed today include: Previous EP office notes.   Assessment and Plan:  1.  Chronic systolic dysfunction s/p St. Jude CRT-D  2. H/o aborted cardiac arrest 2011 euvolemic today Stable on an appropriate medical regimen Normal ICD function See Pace Art report No changes today Known A lead noise  3. Chronic atypical chest pain No CAD  4. HTN Stable on current regimen   5. SCAF She is medicine adverse. Continue to follow burden through device  Current medicines are reviewed at length with the patient today.    Disposition:   Follow up with Dr. Caryl Comes in 6 months    Signed, Shirley Friar, PA-C  05/06/2022 9:39 AM  [facility] [address] [place] [address] [phone] (office) [phone] (fax)

## 2022-05-06 ENCOUNTER — Ambulatory Visit: Payer: Medicaid Other | Admitting: Student

## 2022-05-06 ENCOUNTER — Encounter: Payer: Self-pay | Admitting: Student

## 2022-05-06 VITALS — BP 118/74 | HR 69 | Ht 63.0 in | Wt 121.0 lb

## 2022-05-06 DIAGNOSIS — I4901 Ventricular fibrillation: Secondary | ICD-10-CM

## 2022-05-06 DIAGNOSIS — Z95810 Presence of automatic (implantable) cardiac defibrillator: Secondary | ICD-10-CM

## 2022-05-06 DIAGNOSIS — R079 Chest pain, unspecified: Secondary | ICD-10-CM

## 2022-05-06 DIAGNOSIS — I10 Essential (primary) hypertension: Secondary | ICD-10-CM

## 2022-05-06 DIAGNOSIS — I428 Other cardiomyopathies: Secondary | ICD-10-CM

## 2022-05-06 DIAGNOSIS — I480 Paroxysmal atrial fibrillation: Secondary | ICD-10-CM | POA: Diagnosis not present

## 2022-05-06 DIAGNOSIS — I5022 Chronic systolic (congestive) heart failure: Secondary | ICD-10-CM

## 2022-05-06 LAB — CUP PACEART INCLINIC DEVICE CHECK
Battery Remaining Longevity: 25 mo
Brady Statistic RA Percent Paced: 1.6 %
Brady Statistic RV Percent Paced: 98 %
Date Time Interrogation Session: 20230607092721
HighPow Impedance: 38.7409
Implantable Lead Implant Date: 20110819
Implantable Lead Implant Date: 20110819
Implantable Lead Implant Date: 20110819
Implantable Lead Location: 753858
Implantable Lead Location: 753859
Implantable Lead Location: 753860
Implantable Lead Model: 7121
Implantable Pulse Generator Implant Date: 20190130
Lead Channel Impedance Value: 262.5 Ohm
Lead Channel Impedance Value: 525 Ohm
Lead Channel Impedance Value: 637.5 Ohm
Lead Channel Pacing Threshold Amplitude: 0.5 V
Lead Channel Pacing Threshold Amplitude: 0.5 V
Lead Channel Pacing Threshold Amplitude: 1 V
Lead Channel Pacing Threshold Amplitude: 1 V
Lead Channel Pacing Threshold Amplitude: 1.75 V
Lead Channel Pacing Threshold Amplitude: 1.75 V
Lead Channel Pacing Threshold Pulse Width: 0.5 ms
Lead Channel Pacing Threshold Pulse Width: 0.5 ms
Lead Channel Pacing Threshold Pulse Width: 0.5 ms
Lead Channel Pacing Threshold Pulse Width: 0.5 ms
Lead Channel Pacing Threshold Pulse Width: 0.6 ms
Lead Channel Pacing Threshold Pulse Width: 0.6 ms
Lead Channel Sensing Intrinsic Amplitude: 12 mV
Lead Channel Sensing Intrinsic Amplitude: 2.7 mV
Lead Channel Setting Pacing Amplitude: 2 V
Lead Channel Setting Pacing Amplitude: 2.5 V
Lead Channel Setting Pacing Amplitude: 2.75 V
Lead Channel Setting Pacing Pulse Width: 0.5 ms
Lead Channel Setting Pacing Pulse Width: 0.6 ms
Lead Channel Setting Sensing Sensitivity: 0.5 mV
Pulse Gen Serial Number: 9782720

## 2022-05-06 NOTE — Patient Instructions (Signed)
Medication Instructions:  Your physician recommends that you continue on your current medications as directed. Please refer to the Current Medication list given to you today.  *If you need a refill on your cardiac medications before your next appointment, please call your pharmacy*   Lab Work: None If you have labs (blood work) drawn today and your tests are completely normal, you will receive your results only by: MyChart Message (if you have MyChart) OR A paper copy in the mail If you have any lab test that is abnormal or we need to change your treatment, we will call you to review the results.   Follow-Up: At [facility], you and your health needs are our priority.  As part of our continuing mission to provide you with exceptional heart care, we have created designated Provider Care Teams.  These Care Teams include your primary Cardiologist (physician) and Advanced Practice Providers (APPs -  Physician Assistants and Nurse Practitioners) who all work together to provide you with the care you need, when you need it.  We recommend signing up for the patient portal called "MyChart".  Sign up information is provided on this After Visit Summary.  MyChart is used to connect with patients for Virtual Visits (Telemedicine).  Patients are able to view lab/test results, encounter notes, upcoming appointments, etc.  Non-urgent messages can be sent to your provider as well.   To learn more about what you can do with MyChart, go to [email or web address].    Your next appointment:   6 month(s)  The format for your next appointment:   In Person  Provider:   Steven Klein, MD    

## 2022-07-13 ENCOUNTER — Ambulatory Visit (INDEPENDENT_AMBULATORY_CARE_PROVIDER_SITE_OTHER): Payer: Medicaid Other

## 2022-07-13 DIAGNOSIS — I5022 Chronic systolic (congestive) heart failure: Secondary | ICD-10-CM

## 2022-07-13 LAB — CUP PACEART INCLINIC DEVICE CHECK
Battery Remaining Longevity: 22 mo
Brady Statistic RA Percent Paced: 3.1 %
Brady Statistic RV Percent Paced: 96 %
Date Time Interrogation Session: 20230814101014
HighPow Impedance: 41.625
Implantable Lead Implant Date: 20110819
Implantable Lead Implant Date: 20110819
Implantable Lead Implant Date: 20110819
Implantable Lead Location: 753858
Implantable Lead Location: 753859
Implantable Lead Location: 753860
Implantable Lead Model: 7121
Implantable Pulse Generator Implant Date: 20190130
Lead Channel Impedance Value: 237.5 Ohm
Lead Channel Impedance Value: 462.5 Ohm
Lead Channel Impedance Value: 637.5 Ohm
Lead Channel Sensing Intrinsic Amplitude: 12 mV
Lead Channel Sensing Intrinsic Amplitude: 2.4 mV
Lead Channel Setting Pacing Amplitude: 2 V
Lead Channel Setting Pacing Amplitude: 2.5 V
Lead Channel Setting Pacing Amplitude: 2.75 V
Lead Channel Setting Pacing Pulse Width: 0.5 ms
Lead Channel Setting Pacing Pulse Width: 0.6 ms
Lead Channel Setting Sensing Sensitivity: 0.5 mV
Pulse Gen Serial Number: 9782720

## 2022-07-13 NOTE — Progress Notes (Signed)
Pt walk-in- Pt came to office due to vibration alert from device.  Alert was due to low impedance noted on shock coil.  Pt had two low impedance readings on shock configuration RV to SVC + can.   Discussed with Madolyn Frieze. with [facility].  Advised to change shock configuration to RV to Can.  Programming change made and impedance 42 Ohms.  Dr. Graciela Husbands in agreement with plan.

## 2022-11-05 ENCOUNTER — Encounter: Payer: Self-pay | Admitting: Internal Medicine

## 2022-11-05 ENCOUNTER — Ambulatory Visit: Payer: Medicaid Other | Attending: Internal Medicine | Admitting: Internal Medicine

## 2022-11-05 VITALS — BP 148/76 | HR 85 | Ht 63.0 in | Wt 120.2 lb

## 2022-11-05 DIAGNOSIS — I5022 Chronic systolic (congestive) heart failure: Secondary | ICD-10-CM | POA: Diagnosis present

## 2022-11-05 DIAGNOSIS — I480 Paroxysmal atrial fibrillation: Secondary | ICD-10-CM | POA: Insufficient documentation

## 2022-11-05 DIAGNOSIS — I428 Other cardiomyopathies: Secondary | ICD-10-CM | POA: Diagnosis present

## 2022-11-05 DIAGNOSIS — Z95810 Presence of automatic (implantable) cardiac defibrillator: Secondary | ICD-10-CM | POA: Insufficient documentation

## 2022-11-05 NOTE — Progress Notes (Signed)
.      Patient Care Team: [facility], [facility] as PCP - General [name], Nile Dear, MD as PCP - Cardiology (Cardiology) Duke Salvia, MD as PCP - Electrophysiology (Cardiology) Mack Hook, MD as Consulting Physician (Orthopedic Surgery)   HPI  Angela Payne is a 61 y.o. female Seen in follow-up for an ICD implanted originally for aborted cardiac arrest in the context of a nonischemic myopathy.  She underwent generator replacement 2/19   823 alert for low impedance from the SVC.  It was programmed off.  Dyspnea on exertion 1 flight of stairs may be stable may be someone from the last 6-12 months no edema no nocturnal dyspnea no chest pain no palpitations.   Date Cr K Hgb13.8  1/19 1.15 4.1    1/23 1.00 4.4    DATE TEST EF   8/11 cMRI  29 %   5/16 Echo  35-40%   8/18 Echo   40-45 %   7/22 CT  CaScore 0      Records and Results Reviewed   Past Medical History:  Diagnosis Date   Anemia, unspecified    Atrial fibrillation (HCC)    a. This occurred in setting of resuscitation efforts during VF arrest.   Automatic implantable cardiac defibrillator in situ    a. 06/2010. b. Gen chance in 10/2018.   Cardiac arrest [facility])    a. 06/2010   Chronic systolic heart failure (HCC)    CKD (chronic kidney disease), stage II    Depression    ok   GERD (gastroesophageal reflux disease)    History of cocaine abuse (HCC)    remote   History of tobacco abuse    Hypertension    ICD (implantable cardioverter-defibrillator) lead failure--noise of both leads 6/15 isolated 08/13/2015   Nonischemic cardiomyopathy (HCC)    a. 06/2010 OOH VF arrest;  b. 06/2010 Cath: nonobs dzs, EF 15-20%, glob HK;  c. 06/2010 Echo: EF 15%, diff HK, Gr 2 DD;  d. 06/2010 Cardiac MRI EF of 29%, no scar;  e. 06/2010 s/p SJM 3231-40 Bi V ICD Ser # [id number].   Other left bundle branch block    Paroxysmal supraventricular tachycardia    Thyroid disease     Past Surgical History:  Procedure Laterality  Date   BIV ICD GENERATOR CHANGEOUT N/A 12/29/2017   Procedure: BIV ICD GENERATOR CHANGEOUT;  Surgeon: Duke Salvia, MD;  Location: [facility] INVASIVE CV LAB;  Service: Cardiovascular;  Laterality: N/A;   CARDIAC DEFIBRILLATOR PLACEMENT     St Jude Unify CD [phone] Q defib, serial number [id number]   CARDIAC DEFIBRILLATOR PLACEMENT     CARDIAC DEFIBRILLATOR PLACEMENT     OPEN REDUCTION INTERNAL FIXATION (ORIF) DISTAL RADIAL FRACTURE Left 05/07/2014   Procedure: OPEN REDUCTION INTERNAL FIXATION (ORIF) DISTAL RADIAL FRACTURE;  Surgeon: Jodi Marble, MD;  Location: [facility] OR;  Service: Orthopedics;  Laterality: Left;    Current Meds  Medication Sig   albuterol (PROVENTIL HFA;VENTOLIN HFA) 108 (90 BASE) MCG/ACT inhaler Inhale 2 puffs into the lungs every 4 (four) hours as needed for wheezing or shortness of breath.    aspirin 81 MG chewable tablet Chew 81 mg by mouth daily.   carvedilol (COREG) 25 MG tablet TAKE 1 TABLET (25 MG TOTAL) BY MOUTH 2 (TWO) TIMES DAILY WITH A MEAL.   diphenhydrAMINE (BENADRYL) 25 MG tablet Take 1 tablet (25 mg total) by mouth every 6 (six) hours as needed for itching (Rash).   furosemide (LASIX) 40  MG tablet Take 1 tablet (40 mg total) by mouth daily.   hydrocortisone cream 1 % Apply 1 application topically as needed for itching.   ibuprofen (ADVIL,MOTRIN) 200 MG tablet Take 200-400 mg by mouth every 6 (six) hours as needed for headache or mild pain.    KLOR-CON M20 20 MEQ tablet TAKE 1 TABLET BY MOUTH EVERY DAY   methocarbamol (ROBAXIN) 500 MG tablet Take 500 mg by mouth daily as needed for muscle spasms.   Multiple Vitamin (MULTIVITAMIN WITH MINERALS) TABS Take 1 tablet by mouth daily.   traMADol (ULTRAM) 50 MG tablet Take 1 tablet (50 mg total) by mouth every 6 (six) hours as needed. FOR PAIN    No Known Allergies    Review of Systems negative except from HPI and PMH  Physical Exam BP (!) 148/76   Pulse 85   Ht 5\' 3"  (1.6 m)   Wt 120 lb 3.2 oz (54.5 kg)   SpO2 99%    BMI 21.29 kg/m  Well developed and well nourished in no acute distress HENT normal Neck supple with JVP-flat Clear Device pocket well healed; without hematoma or erythema.  There is no tethering  Regular rate and rhythm, no  gallop No murmur Abd-soft with active BS No Clubbing cyanosis  edema Skin-warm and dry A & Oriented  Grossly normal sensory and motor function  ECG sinus with P synchronous pacing with a negative QRS lead I and a negative QRS lead V37msec   ECG 12/28 negative QRS lead 1 rS lead V1 Device function is normal. Programming changes   See Paceart for details    Assessment and  Plan   Aborted cardiac Arrest   NICM   CRT-D St Jude The patient's device was interrogated.  The information was reviewed. No changes were made in the programming.      Hypertension   CHF chronic systolic  Blood pressure remains inadequately controlled.  We will check an echocardiogram to see where we are with ejection fraction with the plan to initiate Entresto and/or spironolactone based on ejection fraction  She is euvolemic but still having heart failure symptoms.  Again we will make medication decisions based on the echocardiogram.  There is electrical optimization that may be available to 1/29 with the QRSd having increased in the QRS morphology in lead V1 be negative

## 2022-11-05 NOTE — Patient Instructions (Addendum)
Medication Instructions:  Your physician recommends that you continue on your current medications as directed. Please refer to the Current Medication list given to you today.  *If you need a refill on your cardiac medications before your next appointment, please call your pharmacy*   Lab Work: None ordered.  If you have labs (blood work) drawn today and your tests are completely normal, you will receive your results only by: MyChart Message (if you have MyChart) OR A paper copy in the mail If you have any lab test that is abnormal or we need to change your treatment, we will call you to review the results.   Testing/Procedures: Your physician has requested that you have an echocardiogram. Echocardiography is a painless test that uses sound waves to create images of your heart. It provides your doctor with information about the size and shape of your heart and how well your heart's chambers and valves are working. This procedure takes approximately one hour. There are no restrictions for this procedure. Please do NOT wear cologne, perfume, aftershave, or lotions (deodorant is allowed). Please arrive 15 minutes prior to your appointment time.    Follow-Up: At [facility], you and your health needs are our priority.  As part of our continuing mission to provide you with exceptional heart care, we have created designated Provider Care Teams.  These Care Teams include your primary Cardiologist (physician) and Advanced Practice Providers (APPs -  Physician Assistants and Nurse Practitioners) who all work together to provide you with the care you need, when you need it.  We recommend signing up for the patient portal called "MyChart".  Sign up information is provided on this After Visit Summary.  MyChart is used to connect with patients for Virtual Visits (Telemedicine).  Patients are able to view lab/test results, encounter notes, upcoming appointments, etc.  Non-urgent messages can be  sent to your provider as well.   To learn more about what you can do with MyChart, go to [email or web address].    Your next appointment:   3 months with Dr Graciela Husbands  Important Information About Sugar

## 2022-12-04 ENCOUNTER — Ambulatory Visit (HOSPITAL_COMMUNITY): Payer: Medicaid Other | Attending: Cardiology

## 2022-12-04 DIAGNOSIS — I480 Paroxysmal atrial fibrillation: Secondary | ICD-10-CM | POA: Diagnosis not present

## 2022-12-04 DIAGNOSIS — I5022 Chronic systolic (congestive) heart failure: Secondary | ICD-10-CM | POA: Diagnosis not present

## 2022-12-04 DIAGNOSIS — I428 Other cardiomyopathies: Secondary | ICD-10-CM

## 2022-12-04 LAB — ECHOCARDIOGRAM COMPLETE
Area-P 1/2: 3.17 cm2
MV M vel: 2.8 m/s
MV Peak grad: 31.4 mmHg
S' Lateral: 2.3 cm

## 2023-01-04 ENCOUNTER — Other Ambulatory Visit: Payer: Self-pay | Admitting: Physician Assistant

## 2023-01-05 ENCOUNTER — Other Ambulatory Visit: Payer: Self-pay

## 2023-01-05 MED ORDER — FUROSEMIDE 40 MG PO TABS: 40.0000 mg | ORAL_TABLET | Freq: Every day | ORAL | 3 refills | Status: DC

## 2023-02-01 ENCOUNTER — Encounter: Payer: Medicaid Other | Admitting: Internal Medicine

## 2023-02-01 DIAGNOSIS — Z95810 Presence of automatic (implantable) cardiac defibrillator: Secondary | ICD-10-CM

## 2023-02-01 DIAGNOSIS — I428 Other cardiomyopathies: Secondary | ICD-10-CM

## 2023-02-01 DIAGNOSIS — I5022 Chronic systolic (congestive) heart failure: Secondary | ICD-10-CM

## 2023-02-01 NOTE — Progress Notes (Deleted)
.      Patient Care Team: [facility], [place] as PCP - General Angelena Form, Annita Brod, MD as PCP - Cardiology (Cardiology) Deboraha Sprang, MD as PCP - Electrophysiology (Cardiology) Milly Jakob, MD as Consulting Physician (Orthopedic Surgery)   HPI  Angela Payne is a 63 y.o. female Seen in follow-up for an ICD implanted originally for aborted cardiac arrest in the context of a nonischemic myopathy.  She underwent generator replacement 2/19   8/23 alert for low impedance from the SVC.  It was programmed off.  The patient denies chest pain***, shortness of breath***, nocturnal dyspnea***, orthopnea*** or peripheral edema***.  There have been no palpitations***, lightheadedness*** or syncope***.  Complains of ***.    Date Cr K Hgb13.8  1/19 1.15 4.1    1/23 1.00 4.4    DATE TEST EF   8/11 cMRI  29 %   5/16 Echo  35-40%   8/18 Echo   40-45 %   7/22 CT  CaScore 0  1/24 Echo  60-65%       Records and Results Reviewed   Past Medical History:  Diagnosis Date   Anemia, unspecified    Atrial fibrillation ([place])    a. This occurred in setting of resuscitation efforts during VF arrest.   Automatic implantable cardiac defibrillator in situ    a. 06/2010. b. Gen chance in 10/2018.   Cardiac arrest [facility])    a. [id number]   Chronic systolic heart failure (HCC)    CKD (chronic kidney disease), stage II    Depression    ok   GERD (gastroesophageal reflux disease)    History of cocaine abuse ([place])    remote   History of tobacco abuse    Hypertension    ICD (implantable cardioverter-defibrillator) lead failure--noise of both leads 6/15 isolated 08/13/2015   Nonischemic cardiomyopathy ([place])    a. 06/2010 OOH VF arrest;  b. 06/2010 Cath: nonobs dzs, EF 15-20%, glob HK;  c. 06/2010 Echo: EF 15%, diff HK, Gr 2 DD;  d. 06/2010 Cardiac MRI EF of 29%, no scar;  e. 06/2010 s/p SJM 3231-40 Bi V ICD Ser # [id number].   Other left bundle branch block    Paroxysmal supraventricular  tachycardia    Thyroid disease     Past Surgical History:  Procedure Laterality Date   BIV ICD GENERATOR CHANGEOUT N/A 12/29/2017   Procedure: BIV ICD GENERATOR CHANGEOUT;  Surgeon: Deboraha Sprang, MD;  Location: [place] CV LAB;  Service: Cardiovascular;  Laterality: N/A;   CARDIAC DEFIBRILLATOR PLACEMENT     St Jude Unify CD [phone] Q defib, serial number [id number]   CARDIAC DEFIBRILLATOR PLACEMENT     CARDIAC DEFIBRILLATOR PLACEMENT     OPEN REDUCTION INTERNAL FIXATION (ORIF) DISTAL RADIAL FRACTURE Left 05/07/2014   Procedure: OPEN REDUCTION INTERNAL FIXATION (ORIF) DISTAL RADIAL FRACTURE;  Surgeon: Jolyn Nap, MD;  Location: [place];  Service: Orthopedics;  Laterality: Left;    No outpatient medications have been marked as taking for the 02/01/23 encounter (Appointment) with Deboraha Sprang, MD.    No Known Allergies    Review of Systems negative except from HPI and PMH  Physical Exam There were no vitals taken for this visit. Well developed and well nourished in no acute distress HENT normal Neck supple with JVP-flat Clear Device pocket well healed; without hematoma or erythema.  There is no tethering  Regular rate and rhythm, no *** gallop No ***/*** murmur Abd-soft with active BS No Clubbing  cyanosis *** edema Skin-warm and dry A & Oriented  Grossly normal sensory and motor function  ECG ***  Device function is ***normal. ***Programming changes ***  See Paceart for details    Assessment and  Plan   Aborted cardiac Arrest   NICM   CRT-D St Jude    Hypertension   CHF chronic systolic  Blood pressure remains inadequately controlled.  We will check an echocardiogram to see where we are with ejection fraction with the plan to initiate Entresto and/or spironolactone based on ejection fraction  She is euvolemic but still having heart failure symptoms.  Again we will make medication decisions based on the echocardiogram.  There is electrical optimization  that may be available to [place] with the QRSd having increased in the QRS morphology in lead V1 be negative   ***

## 2023-03-10 ENCOUNTER — Other Ambulatory Visit: Payer: Self-pay | Admitting: Physician Assistant

## 2023-04-03 ENCOUNTER — Other Ambulatory Visit: Payer: Self-pay | Admitting: Cardiovascular Disease

## 2023-05-28 ENCOUNTER — Telehealth: Payer: Self-pay | Admitting: Cardiovascular Disease

## 2023-05-28 NOTE — Telephone Encounter (Signed)
Pt c/o medication issue:  1. Name of Medication: Carvedilol  2. How are you currently taking this medication (dosage and times per day)? 1 pill 2 times a day  3. Are you having a reaction (difficulty breathing--STAT)?   4. What is your medication issue? The Carvedilol, manufacturer have changed and the pill size have changed and the number on it, have changed also

## 2023-05-28 NOTE — Telephone Encounter (Signed)
I spoke with patient.  She reports Carvedilol looks different.  She checked with pharmacy and was told there was a manufacturer change. She states pharmacy told her to call her doctor.  Patient is not having any problems with new medication.  I explained to patient our office had no input into what manufacturer the pharmacy used and I asked her to follow up with pharmacy if any additional questions regarding change.

## 2023-07-12 ENCOUNTER — Encounter: Payer: Self-pay | Admitting: Internal Medicine

## 2023-07-12 ENCOUNTER — Telehealth: Payer: Self-pay | Admitting: Licensed Clinical Social Worker

## 2023-07-12 ENCOUNTER — Telehealth: Payer: Self-pay | Admitting: Internal Medicine

## 2023-07-12 ENCOUNTER — Telehealth: Payer: Self-pay

## 2023-07-12 ENCOUNTER — Ambulatory Visit: Payer: MEDICAID | Attending: Internal Medicine | Admitting: Internal Medicine

## 2023-07-12 VITALS — BP 132/90 | HR 66 | Ht 63.0 in | Wt 108.4 lb

## 2023-07-12 DIAGNOSIS — Z95810 Presence of automatic (implantable) cardiac defibrillator: Secondary | ICD-10-CM

## 2023-07-12 DIAGNOSIS — I428 Other cardiomyopathies: Secondary | ICD-10-CM | POA: Diagnosis not present

## 2023-07-12 DIAGNOSIS — I480 Paroxysmal atrial fibrillation: Secondary | ICD-10-CM | POA: Diagnosis not present

## 2023-07-12 LAB — CUP PACEART INCLINIC DEVICE CHECK
Battery Remaining Longevity: 19 mo
Brady Statistic RA Percent Paced: 3.9 %
Brady Statistic RV Percent Paced: 98 %
Date Time Interrogation Session: 20240812111423
HighPow Impedance: 52.875
Implantable Lead Connection Status: 753985
Implantable Lead Connection Status: 753985
Implantable Lead Connection Status: 753985
Implantable Lead Implant Date: 20110819
Implantable Lead Implant Date: 20110819
Implantable Lead Implant Date: 20110819
Implantable Lead Location: 753858
Implantable Lead Location: 753859
Implantable Lead Location: 753860
Implantable Lead Model: 7121
Implantable Pulse Generator Implant Date: 20190130
Lead Channel Impedance Value: 250 Ohm
Lead Channel Impedance Value: 525 Ohm
Lead Channel Impedance Value: 625 Ohm
Lead Channel Pacing Threshold Amplitude: 0.75 V
Lead Channel Pacing Threshold Amplitude: 0.75 V
Lead Channel Pacing Threshold Amplitude: 1 V
Lead Channel Pacing Threshold Amplitude: 1 V
Lead Channel Pacing Threshold Amplitude: 1.5 V
Lead Channel Pacing Threshold Amplitude: 1.5 V
Lead Channel Pacing Threshold Pulse Width: 0.5 ms
Lead Channel Pacing Threshold Pulse Width: 0.5 ms
Lead Channel Pacing Threshold Pulse Width: 0.5 ms
Lead Channel Pacing Threshold Pulse Width: 0.5 ms
Lead Channel Pacing Threshold Pulse Width: 0.6 ms
Lead Channel Pacing Threshold Pulse Width: 0.6 ms
Lead Channel Sensing Intrinsic Amplitude: 12 mV
Lead Channel Sensing Intrinsic Amplitude: 2.2 mV
Lead Channel Setting Pacing Amplitude: 2 V
Lead Channel Setting Pacing Amplitude: 2 V
Lead Channel Setting Pacing Amplitude: 2.75 V
Lead Channel Setting Pacing Pulse Width: 0.5 ms
Lead Channel Setting Pacing Pulse Width: 0.6 ms
Lead Channel Setting Sensing Sensitivity: 0.5 mV
Pulse Gen Serial Number: 9782720

## 2023-07-12 MED ORDER — POTASSIUM CHLORIDE CRYS ER 20 MEQ PO TBCR: 20.0000 meq | EXTENDED_RELEASE_TABLET | Freq: Every day | ORAL | 3 refills | Status: DC

## 2023-07-12 MED ORDER — CARVEDILOL 25 MG PO TABS: 25.0000 mg | ORAL_TABLET | Freq: Two times a day (BID) | ORAL | 3 refills | Status: DC

## 2023-07-12 NOTE — Progress Notes (Signed)
.      Patient Care Team: [facility] as PCP - General [name], Nile Dear, MD as PCP - Cardiology (Cardiology) Duke Salvia, MD as PCP - Electrophysiology (Cardiology) Mack Hook, MD as Consulting Physician (Orthopedic Surgery)   HPI  Angela Payne is a 62 y.o. female Seen in follow-up for an CRT ICD implanted originally for aborted cardiac arrest in the context of a nonischemic myopathy and left bundle branch block.  Generator replacement 2/19.  I is mainly in there with his home   Note reviewed from Dr. Esmond Camper 7/22 disinclined towards ACEs/ARB's  8/ 23 alert for low impedance from the SVC.>>  programmed off.  The patient denies , nocturnal dyspnea, orthopnea or peripheral edema.  There have been no palpitations, lightheadedness or syncope.  Complains of chest pain which started after she worked too hard cleaning her baseboards.  Gradually getting better.  Also has dyspnea and fatigue with exertionplug in.  Has lost weight.  About 35 pounds over the last 2 years 12 pounds over the last 6 months.  Part of this is that she lost her teeth.  No longer drinking..    Date Cr K Hgb13.8  1/19 1.15 4.1    1/23 1.00 4.4    DATE TEST EF   8/11 cMRI  29 %   5/16 Echo  35-40%   8/18 Echo   40-45 %   7/22 CT  CaScore 0  1/24 Echo  60-65%        Records and Results Reviewed   Past Medical History:  Diagnosis Date   Anemia, unspecified    Atrial fibrillation (HCC)    a. This occurred in setting of resuscitation efforts during VF arrest.   Automatic implantable cardiac defibrillator in situ    a. 06/2010. b. Gen chance in 10/2018.   Cardiac arrest [facility])    a. 06/2010   Chronic systolic heart failure (HCC)    CKD (chronic kidney disease), stage II    Depression    ok   GERD (gastroesophageal reflux disease)    History of cocaine abuse (HCC)    remote   History of tobacco abuse    Hypertension    ICD (implantable cardioverter-defibrillator) lead  failure--noise of both leads 6/15 isolated 08/13/2015   Nonischemic cardiomyopathy (HCC)    a. 06/2010 OOH VF arrest;  b. 06/2010 Cath: nonobs dzs, EF 15-20%, glob HK;  c. 06/2010 Echo: EF 15%, diff HK, Gr 2 DD;  d. 06/2010 Cardiac MRI EF of 29%, no scar;  e. 06/2010 s/p SJM 3231-40 Bi V ICD Ser # [id number].   Other left bundle branch block    Paroxysmal supraventricular tachycardia    Thyroid disease     Past Surgical History:  Procedure Laterality Date   BIV ICD GENERATOR CHANGEOUT N/A 12/29/2017   Procedure: BIV ICD GENERATOR CHANGEOUT;  Surgeon: Duke Salvia, MD;  [facility] INVASIVE CV LAB;  Service: Cardiovascular;  Laterality: N/A;   CARDIAC DEFIBRILLATOR PLACEMENT     St Jude Unify CD [phone] Q defib, serial number [id number]   CARDIAC DEFIBRILLATOR PLACEMENT     CARDIAC DEFIBRILLATOR PLACEMENT     OPEN REDUCTION INTERNAL FIXATION (ORIF) DISTAL RADIAL FRACTURE Left 05/07/2014   Procedure: OPEN REDUCTION INTERNAL FIXATION (ORIF) DISTAL RADIAL FRACTURE;  Surgeon: Jodi Marble, MD;  Location: [facility] OR;  Service: Orthopedics;  Laterality: Left;    Current Meds  Medication Sig   albuterol (PROVENTIL HFA;VENTOLIN HFA) 108 (90 BASE) MCG/ACT inhaler  Inhale 2 puffs into the lungs every 4 (four) hours as needed for wheezing or shortness of breath.    aspirin 81 MG chewable tablet Chew 81 mg by mouth daily.   carvedilol (COREG) 25 MG tablet TAKE 1 TABLET (25 MG TOTAL) BY MOUTH TWICE A DAY WITH MEALS   diphenhydrAMINE (BENADRYL) 25 MG tablet Take 1 tablet (25 mg total) by mouth every 6 (six) hours as needed for itching (Rash).   furosemide (LASIX) 40 MG tablet Take 1 tablet (40 mg total) by mouth daily.   hydrocortisone cream 1 % Apply 1 application topically as needed for itching.   ibuprofen (ADVIL,MOTRIN) 200 MG tablet Take 200-400 mg by mouth every 6 (six) hours as needed for headache or mild pain.    methocarbamol (ROBAXIN) 500 MG tablet Take 500 mg by mouth daily as needed for muscle spasms.    Multiple Vitamin (MULTIVITAMIN WITH MINERALS) TABS Take 1 tablet by mouth daily.   potassium chloride SA (KLOR-CON M20) 20 MEQ tablet TAKE 1 TABLET BY MOUTH EVERY DAY   traMADol (ULTRAM) 50 MG tablet Take 1 tablet (50 mg total) by mouth every 6 (six) hours as needed. FOR PAIN    No Known Allergies    Review of Systems negative except from HPI and PMH  Physical Exam BP (!) 132/90   Pulse 66   Ht 5\' 3"  (1.6 m)   Wt 108 lb 6.4 oz (49.2 kg)   SpO2 95%   BMI 19.20 kg/m  Well developed and well nourished in no acute distress HENT normal Neck supple with JVP-flat Clear Device pocket well healed; without hematoma or erythema.  There is no tethering  Regular rate and rhythm, no  murmur Abd-soft with active BS No Clubbing cyanosis  edema Skin-warm and dry A & Oriented  Grossly normal sensory and motor function  ECG sinus P-synchronous/ AV  pacing Neg QRS lead 1 and V1 QRSd 130  Device function is  normal. -- atrial lead noise Programming changes none  See Paceart for details    Assessment and  Plan   Aborted cardiac Arrest   NICM   CRT-D St Jude The patient's device was interrogated.  The information was reviewed. No changes were made in the programming.   Atrial lead noise     Hypertension   HFrecEF class IIb  Weight loss   Recovered nonischemic cardiomyopathy.  Will continue carvedilol.  As noted above, is disinclined towards ARB's/ACEs; with her dyspnea I offer her SGLT2 therapy; she is disinclined towards this and when I asked her why she said my daddy told me not to take the medicine.  Takes her diuretic as needed daily.  Blood pressure is elevated today; however, she says she did not take her carvedilol this morning although she does say she takes it regularly.  Weight loss is concerning.  More recently, there has been the issue of teeth.  I have asked her to follow-up with her PCP in the next couple of months to see if the weight has stabilized.  There is  some associated anorexia.  Asks whether she qualifies for home health support.  Will set her up with social work

## 2023-07-12 NOTE — Patient Instructions (Addendum)
Medication Instructions:  Your physician recommends that you continue on your current medications as directed. Please refer to the Current Medication list given to you today.  *If you need a refill on your cardiac medications before your next appointment, please call your pharmacy*  Follow-Up: At [facility], you and your health needs are our priority.  As part of our continuing mission to provide you with exceptional heart care, we have created designated Provider Care Teams.  These Care Teams include your primary Cardiologist (physician) and Advanced Practice Providers (APPs -  Physician Assistants and Nurse Practitioners) who all work together to provide you with the care you need, when you need it.  Your next appointment:   1 year  Provider:   You may see Sherryl Manges, MD or one of the following Advanced Practice Providers on your designated Care Team:   Francis Dowse, Charlott Holler "Mardelle Matte" [place], [place] Sherie Don, NP Canary Brim, NP    Other Instructions You have been referred to the Heart & Vascular Care Navigation - social workers. They will call you to discuss your needs and any assistance that can be provided.

## 2023-07-12 NOTE — Telephone Encounter (Signed)
Angela Payne refuses remote monitoring, as a result she must come in to the office for her checks.  She is aware that she has to come in to the device clinic to be seen every 3 months because she has an ICD for routine checks.  Angela Payne saw Dr. Graciela Husbands today and we discussed this.  Please call Angela Payne and set her up in the device clinic for 3 month check in November.   Thanks.

## 2023-07-12 NOTE — Telephone Encounter (Signed)
H&V Care Navigation CSW Progress Note  Clinical Social Worker contacted patient by phone to f/u on referral. No context on referral or phone note. I reached pt at [phone], she is currently driving. I will f/u at 3pm as agreed on with pt.  Patient is participating in a Managed Medicaid Plan:  No, [facility] Care  SDOH Screenings   Tobacco Use: Medium Risk (07/12/2023)   Octavio Graves, MSW, LCSW Clinical Social Worker II [facility] Heart/Vascular Care Navigation  [phone]- work cell phone (preferred) [phone]- desk phone

## 2023-07-12 NOTE — Progress Notes (Signed)
Heart and Vascular Care Navigation  07/12/2023  Angela Payne 1961/03/19 [id number]  Reason for Referral: in home assistance Patient is participating in a Managed Medicaid Plan:Yes- [employer]  Engaged with patient by telephone for initial visit for Heart and Vascular Care Coordination.                                                                                                   Assessment: LCSW was able to reach pt at home during my second call to [phone]. Introduced self, role, reason for call. Pt confirmed home address, PCP is currently [facility]. She lives at home alone, but has family that come by and visit/live locally. Her daughter is her primary contact. She does not use any assistive devices to get around her home. Pt receives social security- her rent is controlled based on her income and she denies any issues affording it or her utilities. She has access to car and drives to appts. She denies any issues with obtaining/affording medications and receives SNAP to cover her groceries.   She does express interest in two things: access to in home assistance as standing for too long fatigues her and she could use assistance with some of her home needs and daily cares. She also is interested in switching PCP offices from [place].   We discussed I can fill out the form for Personal Care Services and this will need to be completed by provider. Once completed I will send this to [facility] who will process it and contact pt for in home assessment. LCSW will update pt on Friday. No additional questions/concerns at this time.                                         HRT/VAS Care Coordination     Patients Home Cardiology Office [facility]   Outpatient Care Team Social Worker   Social Worker Name: Octavio Graves, [place], [phone]   Living arrangements for the past 2 months Apartment   Lives with: Self   Patient Current Insurance Coverage Medicaid  [facility] Managed Care    Patient Has Concern With Paying Medical Bills No   Does Patient Have Prescription Coverage? Yes   Home Assistive Devices/Equipment None       Social History:                                                                             SDOH Screenings   Food Insecurity: No Food Insecurity (07/12/2023)  Housing: Low Risk  (07/12/2023)  Transportation Needs: No Transportation Needs (07/12/2023)  Utilities: Not At Risk (07/12/2023)  Financial Resource Strain: Low Risk  (07/12/2023)  Tobacco Use: Medium Risk (07/12/2023)    SDOH  Interventions: Financial Resources:  Corporate treasurer Interventions: Intervention Not Indicated  Food Insecurity:  Food Insecurity Interventions: Intervention Not Indicated  Housing Insecurity:  Housing Interventions: Intervention Not Indicated  Transportation:   Transportation Interventions: Intervention Not Indicated    Other Care Navigation Interventions:     Provided Pharmacy assistance resources  Pt denied any issues obtaining or affording her medications at this time   Follow-up plan:   LCSW will send pt a PCP list as she expressed interest in changing providers. I have sent her my business card and will f/u Friday. PCS form completed, given to Dr. Koren Bound team to complete and send me to submit when complete.

## 2023-07-12 NOTE — Telephone Encounter (Signed)
Pt would like a Child psychotherapist to call her at their earliest convenience.

## 2023-07-13 ENCOUNTER — Telehealth: Payer: Self-pay | Admitting: Licensed Clinical Social Worker

## 2023-07-13 NOTE — Telephone Encounter (Signed)
H&V Care Navigation CSW Progress Note  Clinical Social Worker  securely emailed completed forms for PCS through Medicaid  to [email or web address].   Will f/u to ensure received and send any additional information required for referral.   Patient is participating in a Managed Medicaid Plan:  No, [employer] plan  SDOH Screenings   Food Insecurity: No Food Insecurity (07/12/2023)  Housing: Low Risk  (07/12/2023)  Transportation Needs: No Transportation Needs (07/12/2023)  Utilities: Not At Risk (07/12/2023)  Financial Resource Strain: Low Risk  (07/12/2023)  Tobacco Use: Medium Risk (07/12/2023)   Octavio Graves, MSW, LCSW Clinical Social Worker II [facility] Health Heart/Vascular Care Navigation  [phone]- work cell phone (preferred) [phone]- desk phone

## 2023-07-16 ENCOUNTER — Telehealth: Payer: Self-pay | Admitting: Licensed Clinical Social Worker

## 2023-07-16 NOTE — Telephone Encounter (Signed)
H&V Care Navigation CSW Progress Note  Clinical Social Worker contacted patient by phone to f/u on voicemail left after clinic hours yesterday. No details on voicemail, pt did not answer when I called this morning. Voicemail left requesting return call on [phone].  LCSW then f/u with NCLIFTSS through [employer]now [employer]). LCSW spoke with representative who shares that [facility] referrals for [facility] patients now should be transferred to [facility] for more clarification. LCSW. I then spoke with [facility] rep who transferred me to rep at [facility] who is pt Managed Care Organization who had me speak with Arlys John and then leave a message for Aquilla Solian who is the team lead. Since it was not clear if Latonya's voicemail was secure at [phone] I did not leave pt details just requested call back. I then called Okey Regal, who is a member of [employer]. She answered at [phone]. They can enroll patient in services.   LCSW did receive another call back from Elizabeth who shared that pt was hesistant to sign up for services at this time but if she was interested she could arrange an appointment for her next week. LCSW called pt again at [phone], was able to reach her this time. Gave her a bit more clarity on how her [employer] benefits are working, pt states she just really saw the packet recently but didn't review it. Pt will call Okey Regal back with [employer] and set up an appointment next week to review what [employer] can do for her. I will f/u next week too to check in.    Patient is participating in a Managed Medicaid Plan:  No, [employer] T[employer]ored Plan  SDOH Screenings   Food Insecurity: No Food Insecurity (07/12/2023)  Housing: Low Risk  (07/12/2023)  Transportation Needs: No Transportation Needs (07/12/2023)  Utilities: Not At Risk (07/12/2023)  Financial Resource Strain: Low Risk  (07/12/2023)  Tobacco Use: Medium Risk (07/12/2023)      Octavio Graves, MSW, LCSW Clinical Social Worker II [facility]  Health Heart/Vascular Care Navigation  [phone]- work cell phone (preferred) [phone]- desk phone

## 2023-07-22 ENCOUNTER — Telehealth: Payer: Self-pay | Admitting: Licensed Clinical Social Worker

## 2023-07-22 NOTE — Telephone Encounter (Signed)
H&V Care Navigation CSW Progress Note  Clinical Social Worker contacted patient by phone to f/u on referral to [employer], no answer this morning. Left voicemail at [phone], will re-attempt as able if no return call.  Patient is participating in a Managed Medicaid Plan:  No, [employer] Care Plan  SDOH Screenings   Food Insecurity: No Food Insecurity (07/12/2023)  Housing: Low Risk  (07/12/2023)  Transportation Needs: No Transportation Needs (07/12/2023)  Utilities: Not At Risk (07/12/2023)  Financial Resource Strain: Low Risk  (07/12/2023)  Tobacco Use: Medium Risk (07/12/2023)    Octavio Graves, MSW, LCSW Clinical Social Worker II [facility] Health Heart/Vascular Care Navigation  [phone]- work cell phone (preferred) [phone]- desk phone

## 2023-07-26 ENCOUNTER — Telehealth: Payer: Self-pay | Admitting: Licensed Clinical Social Worker

## 2023-07-26 NOTE — Progress Notes (Deleted)
Cardiology Office Note    Date:  07/26/2023  ID:  Angela, Payne Jun 12, 1961, MRN [id number] PCP:  [facility]  Cardiologist:  Verne Carrow, MD  Electrophysiologist:  Sherryl Manges, MD   Chief Complaint: ***  History of Present Illness: .    Angela Payne is a 61 y.o. female with visit-pertinent history of NICM, chronic HFrEF, cocaine use, cardiac arrest, PAF, LBBB who is seen for follow-up. She was initially admitted in 2011 with VF arrest possibly related to cocaine use at the time. Cath showed no CAD. EF was 15% by echo around that time. 2011 cMRI showed EF 29% with no infarct or scar tissue, most c/w NICM. She had atrial fib in the setting of rescuscitation efforts during VF arrest but no clinical recurrence. She underwent ICD implantation on 07/18/10 and last had BiV-ICD generator change in 2019. She previously refused GDMT titration including Entresto and spironolactone. She was also felt to be a poor candidate for spironolactone due to medication compliance issues at times over the years. She has had chronic chest pain. Cor CT 04/2021 was negative for CAD with CAC 0 and no significant incidental findings. Her EF has been reassessed over the years, in the 40-45% range last decade, but last echo 11/2022 showed EF 60-65%, trivial MR, aortic sclerosis without stenosis. She also previously declined remote ICD monitoring so has been following with EP q40months. Last device interrogation stated, "Numerous AMS episodes, average rates reviewed 170-236bpm, short in duration, appears SVT with some with atrial noise present at times. Known noise on RA lead.  Dr. Graciela Husbands reviewed, histograms appear appropriate. longest episode is one 2 hour atrial rate of 640/vrate peak 181bpm event. No EGM to review. P waves measure 2.2 today and programmed at 1.58mv.  Per Dr. Graciela Husbands will continue to monitor for now."  No labs since 11/2021  Chronic HFrEF/NICM with improved EF Paroxysmal atrial  fibrillation Chronic chest pain  Labwork independently reviewed: 11/2021 trops neg, K 4.4, Cr 1.0, Hgb 13.8, plt 229  ROS: .    Please see the history of present illness. Otherwise, review of systems is positive for ***.  All other systems are reviewed and otherwise negative.  Studies Reviewed: Marland Kitchen    EKG:  EKG is ordered today, personally reviewed, demonstrating ***  CV Studies: Cardiac studies reviewed are outlined and summarized above. Otherwise please see EMR for full report.   Current Reported Medications:.    No outpatient medications have been marked as taking for the 07/28/23 encounter (Appointment) with Laurann Montana, PA-C.    Physical Exam:    VS:  There were no vitals taken for this visit.   Wt Readings from Last 3 Encounters:  07/12/23 108 lb 6.4 oz (49.2 kg)  11/05/22 120 lb 3.2 oz (54.5 kg)  05/06/22 121 lb (54.9 kg)    GEN: Well nourished, well developed in no acute distress NECK: No JVD; No carotid bruits CARDIAC: ***RRR, no murmurs, rubs, gallops RESPIRATORY:  Clear to auscultation without rales, wheezing or rhonchi  ABDOMEN: Soft, non-tender, non-distended EXTREMITIES:  No edema; No acute deformity   Asessement and Plan:.     ***     Disposition: F/u with ***  Signed, Laurann Montana, PA-C

## 2023-07-26 NOTE — Telephone Encounter (Signed)
H&V Care Navigation CSW Progress Note  Clinical Social Worker contacted patient by phone to f/u on referral to [employer], no answer this morning. Left a second voicemail at [phone], will re-attempt a third time as able if no return call.   Patient is participating in a Managed Medicaid Plan:  No, [employer] Care Plan    SDOH Screenings   Food Insecurity: No Food Insecurity (07/12/2023)  Housing: Low Risk  (07/12/2023)  Transportation Needs: No Transportation Needs (07/12/2023)  Utilities: Not At Risk (07/12/2023)  Financial Resource Strain: Low Risk  (07/12/2023)  Tobacco Use: Medium Risk (07/12/2023)   Angela Payne, MSW, LCSW Clinical Social Worker II [facility] Health Heart/Vascular Care Navigation  [phone]- work cell phone (preferred) [phone]- desk phone

## 2023-07-28 ENCOUNTER — Ambulatory Visit: Payer: MEDICAID | Attending: Physician Assistant | Admitting: Physician Assistant

## 2023-07-28 DIAGNOSIS — I480 Paroxysmal atrial fibrillation: Secondary | ICD-10-CM

## 2023-07-28 DIAGNOSIS — I428 Other cardiomyopathies: Secondary | ICD-10-CM

## 2023-07-28 DIAGNOSIS — G8929 Other chronic pain: Secondary | ICD-10-CM

## 2023-07-28 DIAGNOSIS — I5022 Chronic systolic (congestive) heart failure: Secondary | ICD-10-CM

## 2023-07-29 ENCOUNTER — Telehealth: Payer: Self-pay | Admitting: Licensed Clinical Social Worker

## 2023-07-29 NOTE — Telephone Encounter (Signed)
H&V Care Navigation CSW Progress Note  Clinical Social Worker contacted patient by phone to f/u on referral to [employer], no answer this afternoon. Left a third voicemail at [phone]. Remain available should pt return calls/re-engage in care navigation assistance.    Patient is participating in a Managed Medicaid Plan:  No, [employer] T[employer] Care Plan  SDOH Screenings   Food Insecurity: No Food Insecurity (07/12/2023)  Housing: Low Risk  (07/12/2023)  Transportation Needs: No Transportation Needs (07/12/2023)  Utilities: Not At Risk (07/12/2023)  Financial Resource Strain: Low Risk  (07/12/2023)  Tobacco Use: Medium Risk (07/12/2023)   Octavio Graves, MSW, LCSW Clinical Social Worker II [facility] Health Heart/Vascular Care Navigation  [phone]- work cell phone (preferred) [phone]- desk phone

## 2023-09-14 ENCOUNTER — Telehealth: Payer: Self-pay | Admitting: Internal Medicine

## 2023-09-14 NOTE — Telephone Encounter (Signed)
Pt c/o medication issue:  1. Name of Medication:  carvedilol (COREG) 25 MG tablet  2. How are you currently taking this medication (dosage and times per day)?  As prescribed, 1 tablet twice daily  3. Are you having a reaction (difficulty breathing--STAT)?   4. What is your medication issue?  Patient states the pharmacy changed manufacturers and the pill is smaller. She thinks the new tablet may be causing dizziness. She would like a call back to discuss.

## 2023-09-14 NOTE — Telephone Encounter (Signed)
Spoke with patient concerning change in appearance of medication. Attempted to look up medication to confirm for her that this is in fact the correct medication but patient unable to make out the markings on tablet. Instructed patient to contact her pharmacy that it was filled at to confirm manufacturer change. Patient states occasional dizziness isn't new but wanted to confirm with what appeared to be a new tablet.  Patient thankful for the quick call back, will contact her pharmacy, no further issues at this time

## 2023-10-13 ENCOUNTER — Ambulatory Visit: Payer: MEDICAID

## 2023-10-21 ENCOUNTER — Ambulatory Visit: Payer: MEDICAID | Attending: Cardiovascular Disease

## 2023-10-21 ENCOUNTER — Telehealth: Payer: Self-pay | Admitting: Internal Medicine

## 2023-10-21 DIAGNOSIS — I4901 Ventricular fibrillation: Secondary | ICD-10-CM | POA: Diagnosis not present

## 2023-10-21 DIAGNOSIS — I428 Other cardiomyopathies: Secondary | ICD-10-CM

## 2023-10-21 DIAGNOSIS — I5022 Chronic systolic (congestive) heart failure: Secondary | ICD-10-CM

## 2023-10-21 LAB — CUP PACEART INCLINIC DEVICE CHECK
Battery Remaining Longevity: 18 mo
Brady Statistic RA Percent Paced: 3.2 %
Brady Statistic RV Percent Paced: 98 %
Date Time Interrogation Session: 20241121131347
HighPow Impedance: 43.875
Implantable Lead Connection Status: 753985
Implantable Lead Connection Status: 753985
Implantable Lead Connection Status: 753985
Implantable Lead Implant Date: 20110819
Implantable Lead Implant Date: 20110819
Implantable Lead Implant Date: 20110819
Implantable Lead Location: 753858
Implantable Lead Location: 753859
Implantable Lead Location: 753860
Implantable Lead Model: 7121
Implantable Pulse Generator Implant Date: 20190130
Lead Channel Impedance Value: 250 Ohm
Lead Channel Impedance Value: 487.5 Ohm
Lead Channel Impedance Value: 487.5 Ohm
Lead Channel Pacing Threshold Amplitude: 0.5 V
Lead Channel Pacing Threshold Amplitude: 0.5 V
Lead Channel Pacing Threshold Amplitude: 1 V
Lead Channel Pacing Threshold Amplitude: 1 V
Lead Channel Pacing Threshold Amplitude: 1.75 V
Lead Channel Pacing Threshold Amplitude: 1.75 V
Lead Channel Pacing Threshold Pulse Width: 0.5 ms
Lead Channel Pacing Threshold Pulse Width: 0.5 ms
Lead Channel Pacing Threshold Pulse Width: 0.5 ms
Lead Channel Pacing Threshold Pulse Width: 0.5 ms
Lead Channel Pacing Threshold Pulse Width: 0.6 ms
Lead Channel Pacing Threshold Pulse Width: 0.6 ms
Lead Channel Sensing Intrinsic Amplitude: 12 mV
Lead Channel Sensing Intrinsic Amplitude: 2.6 mV
Lead Channel Setting Pacing Amplitude: 2 V
Lead Channel Setting Pacing Amplitude: 2 V
Lead Channel Setting Pacing Amplitude: 2.75 V
Lead Channel Setting Pacing Pulse Width: 0.5 ms
Lead Channel Setting Pacing Pulse Width: 0.6 ms
Lead Channel Setting Sensing Sensitivity: 0.5 mV
Pulse Gen Serial Number: 9782720

## 2023-10-21 NOTE — Telephone Encounter (Signed)
Hello can you please review for refill. Pt states she does not have a pcp and that we have filled this for her.

## 2023-10-21 NOTE — Progress Notes (Signed)
CRT-D device check in office. Thresholds and sensing consistent with previous device measurements. Lead impedance trends stable over time.  No mode switch episodes. 1 episode labeled VT with successful ATP therapy on 08/26/23. Per review of event, appears to be SVT. Will send to covering physician to review. Patient biventricular pacing 98% of the time. Device programmed with appropriate safety margins. Heart failure diagnostics reviewed and trends are stable for patient. No changes made this session. Estimated longevity 1.6 years. Plan to see in office in 3 months. Patient education completed including shock plan. Patient advised of DMV safety guidelines regarding not driving for 6 months after ICD therapy delivered-will contact patient if indeed episode is SVT.

## 2023-10-21 NOTE — Patient Instructions (Signed)
Follow up as scheduled.  

## 2023-10-21 NOTE — Telephone Encounter (Signed)
*  STAT* If patient is at the pharmacy, call can be transferred to refill team.   1. Which medications need to be refilled? (please list name of each medication and dose if known)  albuterol (PROVENTIL HFA;VENTOLIN HFA) 108 (90 BASE) MCG/ACT inhaler      2. Which pharmacy/location (including street and city if local pharmacy) is medication to be sent to? [id number] - [place], [place] - [address]  3. Do they need a 30 day or 90 day supply?   90 day supply

## 2023-10-21 NOTE — Telephone Encounter (Signed)
Spoke with pt and advised Dr Graciela Husbands is currently out of the country and recommend pt contact her PCP or an UC for refill of this medication.  Pt verbalizes understanding and agrees with current plan.

## 2023-12-05 IMAGING — CR DG CHEST 2V
2 series · 2 of 2 positions shown · non-contrast
Comparison: 05/07/2014

CLINICAL DATA: Chest pain.

EXAM:
CHEST - 2 VIEW

[chest pa]
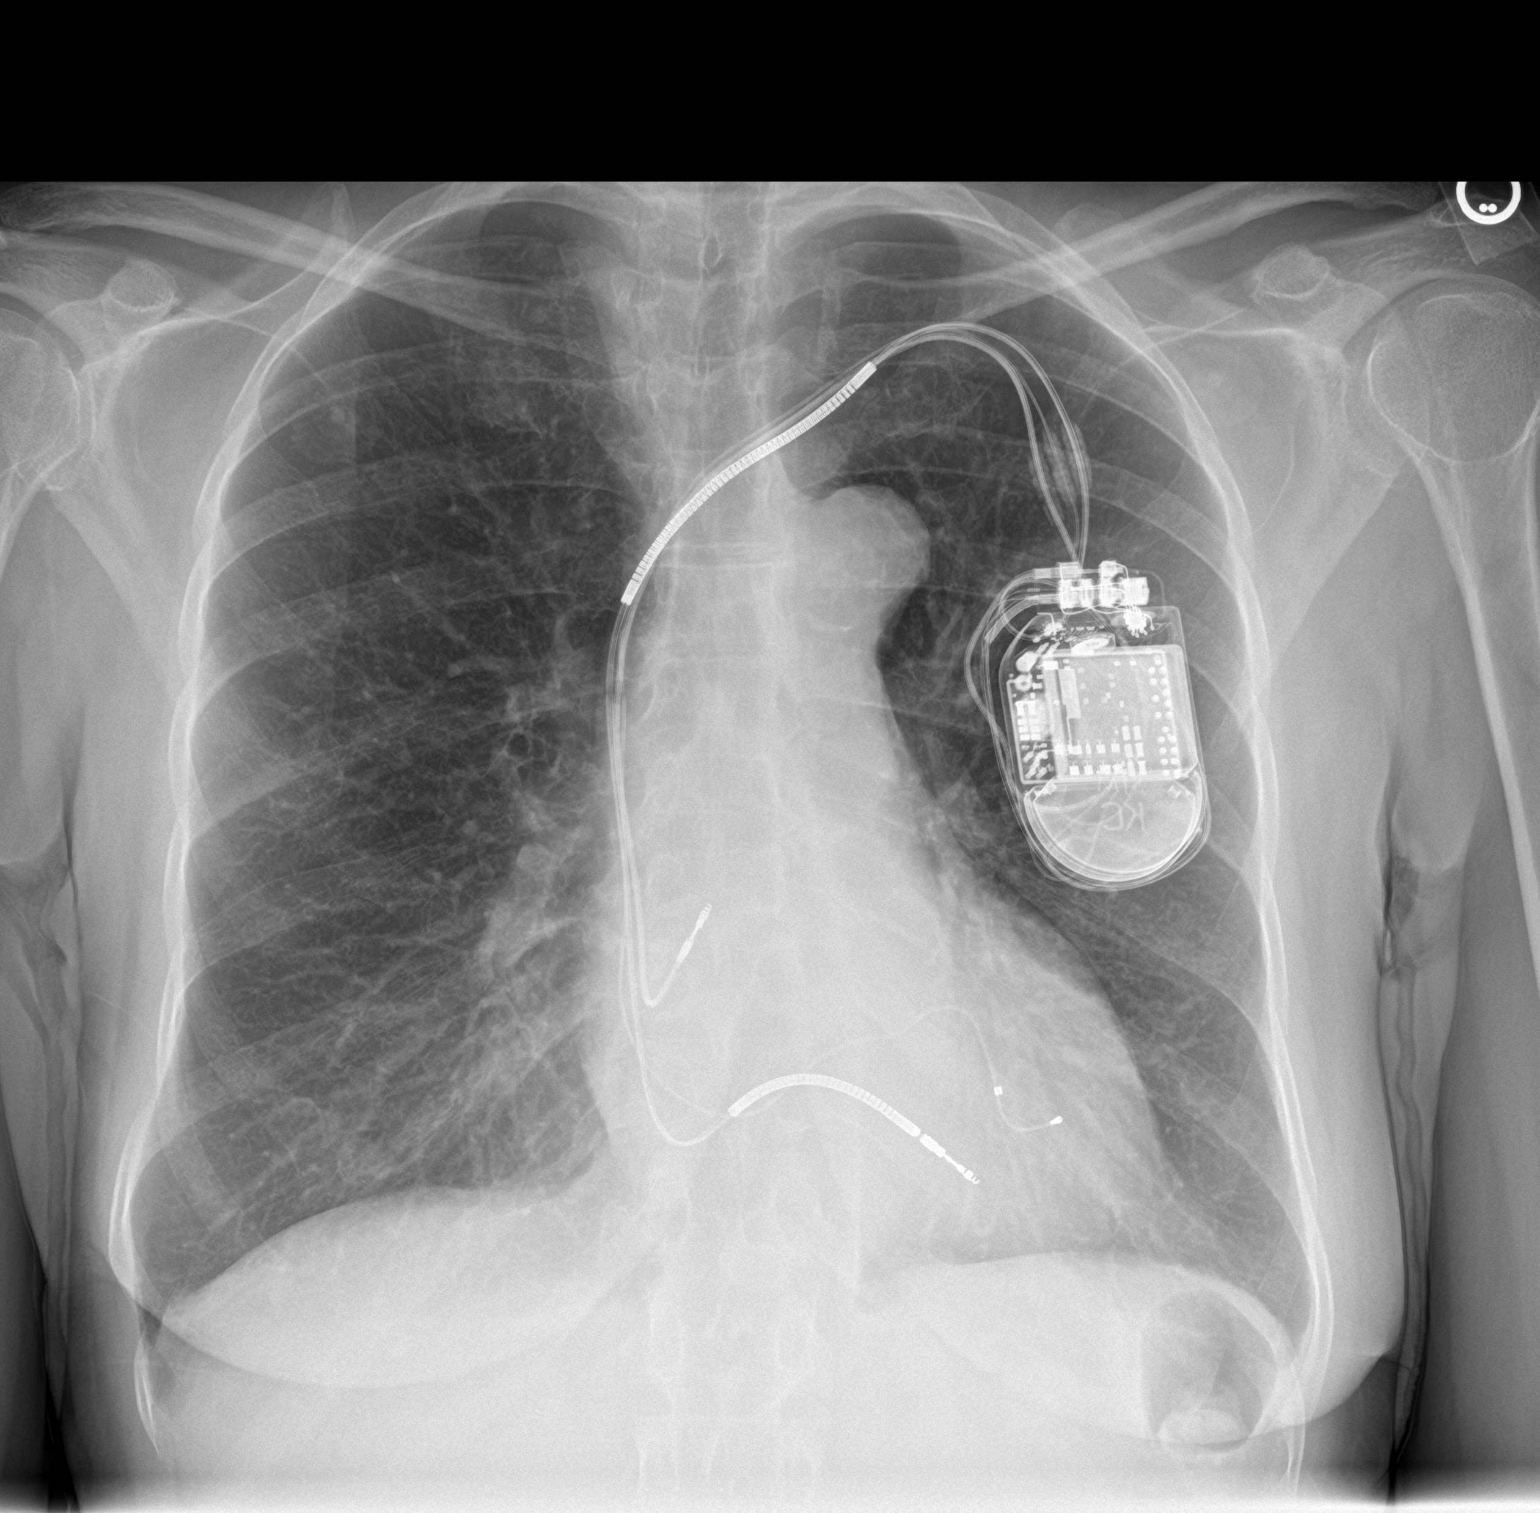

[chest lat]
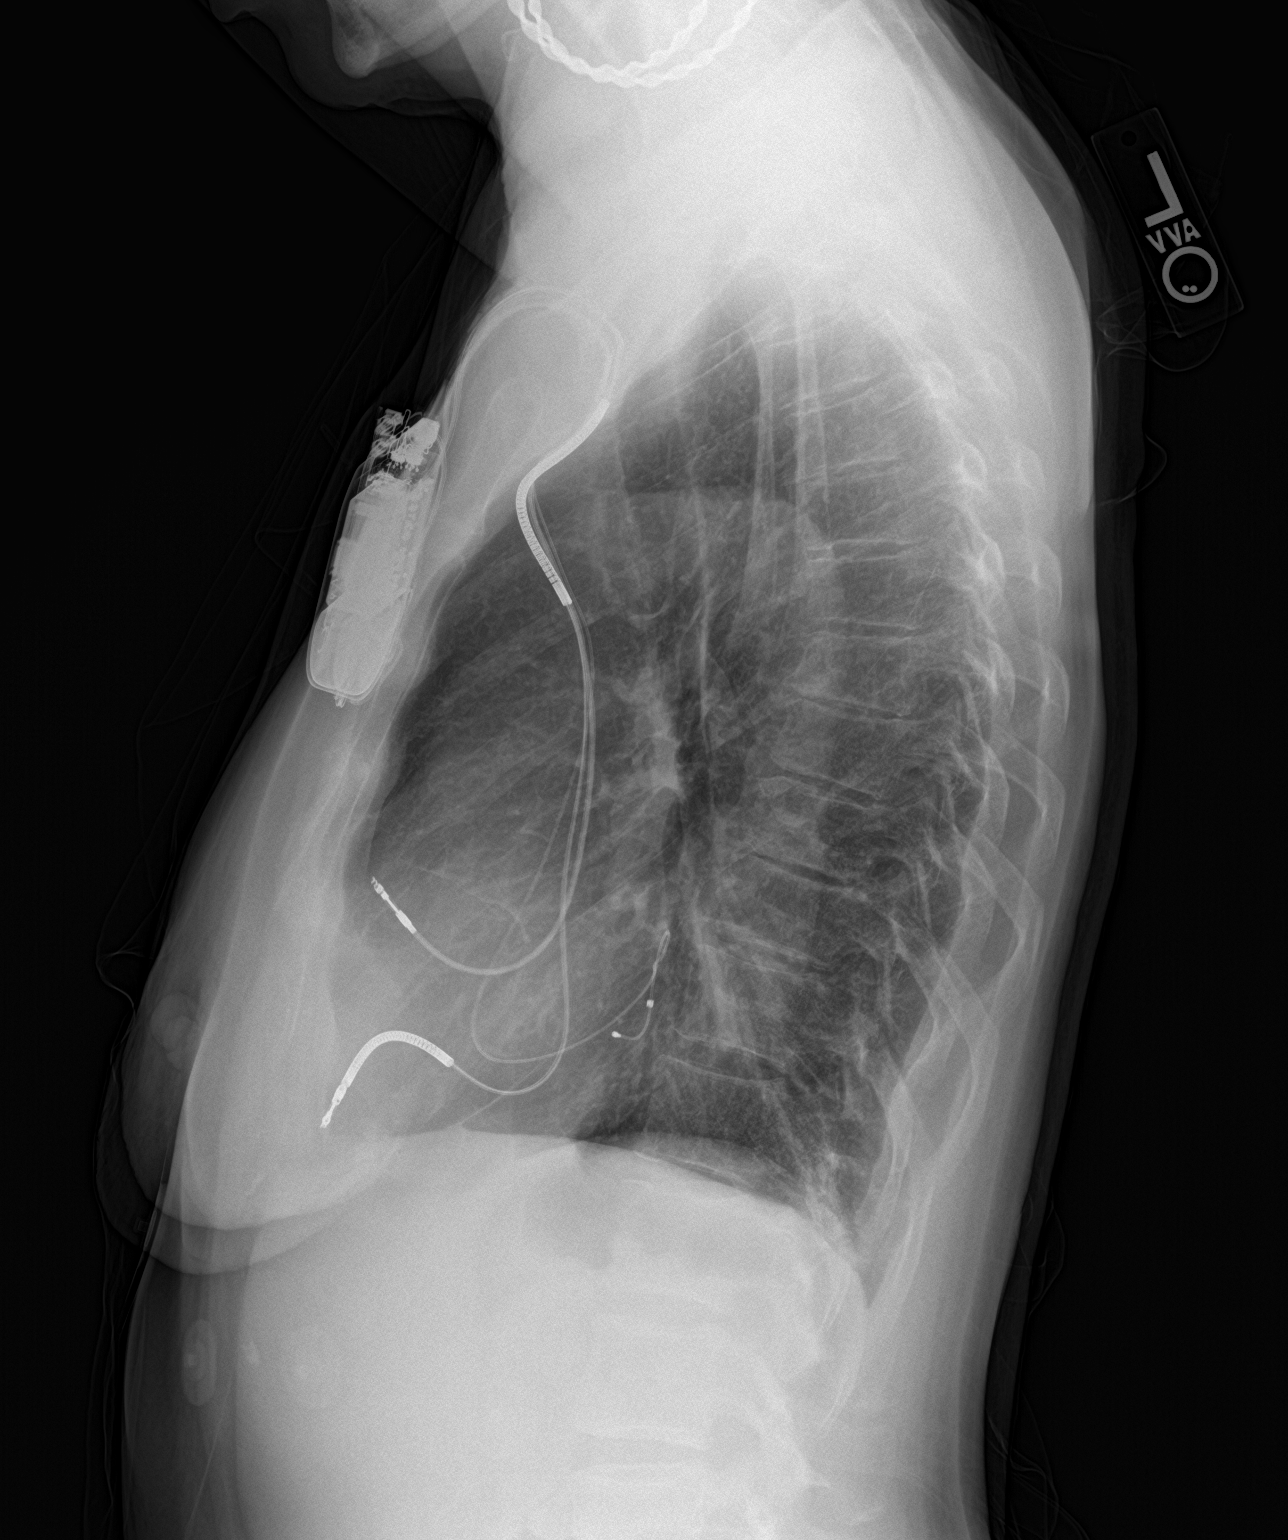

[2 of 2 positions shown; findings below may reference images not displayed]

FINDINGS: Heart size is within normal limits. AICD remains in appropriate
position. Aortic atherosclerotic calcification noted. Both lungs are
well aerated and clear.
IMPRESSION: No active cardiopulmonary disease.

## 2024-07-04 NOTE — Progress Notes (Unsigned)
 Cardiology Office Note Date:  07/04/2024  Patient ID:  Angela Payne, Angela Payne [birth year], MRN [id number] PCP:  [facility]  Cardiologist:  Dr. Verlin Electrophysiologist: Dr. Fernande  Chief Complaint:  annual device visit  History of Present Illness: Angela Payne is a 63 y.o. female with history of NICM, VF arrest, (Afib post arrest), HTN, LBBB, chronic CHF (systolic), CKD (II).  Hx of some non-compliance with testing, visits   I saw her a couple time in 2022, last was Dec 2022 She is doing well She did not start the losartan , mentions she has told Dr. Verlin and all her doctors that she does not want any new medicines. She also does not want to do remote monitoring, she brought her transmitters and does not want them in her house. She has not had more CP and in hind sight thinks was muscular having started to do push ups to try and improve tone of her upper arms. No near syncope or syncope No palitationos No SOB  Discussed SCAF, very low burden She was not doing remotes and discussed the importance > she was firm > did not want to participate in remotes  Seeing Dr. Fernande a couple times since then Last on 07/12/23 Some CP, DOE, had lost weight ~ 12 lbs in 2 years, no longer drinking Component of poor intake with no teeth No mention of any AF No changes were made Again declined remotes  Device clinic note Nov 2024 1 episode labeled VT with successful ATP therapy on 08/26/23. Per review of event, appears to be SVT.   TODAY  Generally doing well Says that ever since she has had the device she seems to have an awareness of a flutter in her beat now and then, is fleeting and not bothersome, prefers to leave it alone She has an awareness of the device it'sself, just bothers me being there, not painful  No dizzy spells, near syncope or syncope No CP Some DOE that has been there for many years, no difficulties with ADLs   Device History: St. Jude BiV ICD  implanted originally 06/2010,  gen change 12/29/2017 secondary prevention  Known to have noise on her A lead 8/23 alert for low impedance from the SVC.>>  programmed off.   History of appropriate therapy: No History of AAD therapy: No  SCAF found Dec 2022   Past Medical History:  Diagnosis Date   Anemia, unspecified    Atrial fibrillation (HCC)    a. This occurred in setting of resuscitation efforts during VF arrest.   Automatic implantable cardiac defibrillator in situ    a. 06/2010. b. Gen chance in 10/2018.   Cardiac arrest [facility])    a. 06/2010   Chronic systolic heart failure (HCC)    CKD (chronic kidney disease), stage II    Depression    ok   GERD (gastroesophageal reflux disease)    History of cocaine abuse (HCC)    remote   History of tobacco abuse    Hypertension    ICD (implantable cardioverter-defibrillator) lead failure--noise of both leads 6/15 isolated 08/13/2015   Nonischemic cardiomyopathy (HCC)    a. 06/2010 OOH VF arrest;  b. 06/2010 Cath: nonobs dzs, EF 15-20%, glob HK;  c. 06/2010 Echo: EF 15%, diff HK, Gr 2 DD;  d. 06/2010 Cardiac MRI EF of 29%, no scar;  e. 06/2010 s/p SJM 3231-40 Bi V ICD Ser # [id number].   Other left bundle branch block    Paroxysmal supraventricular  tachycardia    Thyroid disease     Past Surgical History:  Procedure Laterality Date   BIV ICD GENERATOR CHANGEOUT N/A 12/29/2017   Procedure: BIV ICD GENERATOR CHANGEOUT;  Surgeon: Fernande Elspeth BROCKS, MD;  Location: [facility] INVASIVE CV LAB;  Service: Cardiovascular;  Laterality: N/A;   CARDIAC DEFIBRILLATOR PLACEMENT     St Jude Unify CD [phone] Q defib, serial number [id number]   CARDIAC DEFIBRILLATOR PLACEMENT     CARDIAC DEFIBRILLATOR PLACEMENT     OPEN REDUCTION INTERNAL FIXATION (ORIF) DISTAL RADIAL FRACTURE Left 05/07/2014   Procedure: OPEN REDUCTION INTERNAL FIXATION (ORIF) DISTAL RADIAL FRACTURE;  Surgeon: Alm DELENA Hummer, MD;  Location: [facility] OR;  Service: Orthopedics;  Laterality: Left;    Current  Outpatient Medications  Medication Sig Dispense Refill   albuterol (PROVENTIL HFA;VENTOLIN HFA) 108 (90 BASE) MCG/ACT inhaler Inhale 2 puffs into the lungs every 4 (four) hours as needed for wheezing or shortness of breath.      aspirin 81 MG chewable tablet Chew 81 mg by mouth daily.     carvedilol  (COREG ) 25 MG tablet Take 1 tablet (25 mg total) by mouth 2 (two) times daily with a meal. 180 tablet 3   diphenhydrAMINE  (BENADRYL ) 25 MG tablet Take 1 tablet (25 mg total) by mouth every 6 (six) hours as needed for itching (Rash). 30 tablet 0   furosemide  (LASIX ) 40 MG tablet Take 1 tablet (40 mg total) by mouth daily. (Patient taking differently: Take 40 mg by mouth as needed.) 90 tablet 3   hydrocortisone  cream 1 % Apply 1 application topically as needed for itching.     ibuprofen  (ADVIL ,MOTRIN ) 200 MG tablet Take 200-400 mg by mouth every 6 (six) hours as needed for headache or mild pain.      methocarbamol  (ROBAXIN ) 500 MG tablet Take 500 mg by mouth daily as needed for muscle spasms.     Multiple Vitamin (MULTIVITAMIN WITH MINERALS) TABS Take 1 tablet by mouth daily.     potassium chloride  SA (KLOR-CON  M20) 20 MEQ tablet Take 1 tablet (20 mEq total) by mouth daily. 90 tablet 3   traMADol  (ULTRAM ) 50 MG tablet Take 1 tablet (50 mg total) by mouth every 6 (six) hours as needed. FOR PAIN 30 tablet 0   No current facility-administered medications for this visit.    Allergies:   Patient has no known allergies.   Social History:  The patient  reports that she quit smoking about 12 years ago. Her smoking use included cigarettes. She has never used smokeless tobacco. She reports that she does not drink alcohol and does not use drugs.   Family History:  The patient's family history includes Heart disease in her father and mother; Hypertension in her father and mother.  ROS:  Please see the history of present illness.    All other systems are reviewed and otherwise negative.   PHYSICAL EXAM:  VS:   There were no vitals taken for this visit. BMI: There is no height or weight on file to calculate BMI. Well nourished, well developed, in no acute distress, looks younger then her age HEENT: normocephalic, atraumatic Neck: no JVD, carotid bruits or masses Cardiac: RRR; no significant murmurs, no rubs, or gallops Lungs: CTA b/l, no wheezing, rhonchi or rales Abd: soft, nontender MS: no deformity or atrophy Ext: no edema Skin: warm and dry, no rash Neuro:  No gross deficits appreciated Psych: euthymic mood, full affect  ICD site is stable, no tethering or discomfort   EKG:  done today and reviewed by myself AS/VP 75bpm, morphology with small + deflection V1, QRS   Device interrogation done today and reviewed by myself:  Battery and lead measurements are good A lead noise noted, measurements are good/stable No VT No true AFib Some ST/AT ~ 110-120 noted   12/04/22: TTE 1. Left ventricular ejection fraction, by estimation, is 60 to 65%. The  left ventricle has normal function. The left ventricle has no regional  wall motion abnormalities. Left ventricular diastolic parameters were  normal. The average left ventricular  global longitudinal strain is -21.5 %. The global longitudinal strain is  normal.   2. Right ventricular systolic function is normal. The right ventricular  size is normal.   3. The mitral valve is normal in structure. Trivial mitral valve  regurgitation. No evidence of mitral stenosis.   4. The aortic valve is tricuspid. Aortic valve regurgitation is not  visualized. Aortic valve sclerosis/calcification is present, without any  evidence of aortic stenosis.   5. The inferior vena cava is normal in size with greater than 50%  respiratory variability, suggesting right atrial pressure of 3 mmHg.     05/26/21: Coronary CTa IMPRESSION: 1. No evidence of CAD, CADRADS = 0. 2. Coronary calcium score of 0. This was 0 percentile for age and sex matched  control. 3. Normal coronary origin with right dominance. 4. Consider non-coronary causes of chest pain  Radiology over read IMPRESSION: No significant incidental findings.  07/13/2017: TTE Study Conclusions  - Left ventricle: The cavity size was normal. Posterior wall    thickness was increased in a pattern of mild LVH. Systolic    function was mildly to moderately reduced. The estimated ejection    fraction was in the range of 40% to 45%. Diffuse hypokinesis.    Features are consistent with a pseudonormal left ventricular    filling pattern, with concomitant abnormal relaxation and    increased filling pressure (grade 2 diastolic dysfunction).    Doppler parameters are consistent with indeterminate ventricular    filling pressure.  - Mitral valve: Transvalvular velocity was within the normal range.    There was no evidence for stenosis. There was mild regurgitation.  - Left atrium: The atrium was mildly dilated.  - Right ventricle: The cavity size was normal. Wall thickness was    normal. Systolic function was normal.  - Atrial septum: No defect or patent foramen ovale was identified    by color flow Doppler.  - Tricuspid valve: There was mild regurgitation.  - Pulmonary arteries: Systolic pressure was within the normal    range. PA peak pressure: 23 mm Hg (S).    Recent Labs: No results found for requested labs within last 365 days.  No results found for requested labs within last 365 days.   CrCl cannot be calculated (Patient's most recent lab result is older than the maximum 21 days allowed.).   Wt Readings from Last 3 Encounters:  07/12/23 108 lb 6.4 oz (49.2 kg)  11/05/22 120 lb 3.2 oz (54.5 kg)  05/06/22 121 lb (54.9 kg)     Other studies reviewed: Additional studies/records reviewed today include: summarized above  ASSESSMENT AND PLAN:  1. ICD    Known A lead noise    no programming changes made  2. NICM 3. Chronic CHF (systolic/diastolic)     Last echo  Jan 2024 > 60-65%     CorVue trending up, is above threshold     BP 98%  Labs today  4. H/o aborted cardiac arrest 2011     No CAD at the time by cath     No V arrhytmias  5. Chronic atypical CP     No CAD    6. HTN     No changes  7. SCAF No true AFib noted   Discussed need for new EP MD, will have her meet Dr. Almetta next visit    Disposition: She would like to re-establish with Dr. Aretta, she doesn't do or want to do remotes, will have her back in 31mo (her preference) , sooner if needed  Current medicines are reviewed at length with the patient today.  The patient did not have any concerns regarding medicines.  Bonney Charlies Arthur, PA-C 07/04/2024 8:40 AM     [facility] [address] [place] [address] [phone] (office)  [phone] (fax)

## 2024-07-04 NOTE — Progress Notes (Signed)
 [facility] 07/04/24  Dental procedures in this visit  . D2150 - AMALGAM 2 SURFACE 31 B(V)O  . [id number] - SINGLE X-RAY   SUBJECTIVE Chief complaint: Patient reports with caries lesion(s) in need of restorative treatment.  Health History? Medical History[1] Medications Ordered Prior to Encounter[2] Allergies[3]  Problem List[4]  Surgical History[5] Family History[6] Social History[7]  Medical Risk Assessment ASA GRADE: ASA 2 - Patient with mild systemic disease with no functional limitations  Medical history was reviewed and updated. No contraindication to care.  Pain Level Tooth/teeth to be treated testing symptomatic. Patient reports  . Pain score currently: 6 and 0 (0-10 scale)  Patient's expectations: Patient understands the procedure and expects a functional and esthetically pleasing restoration.  OBJECTIVE Vital Signs BP Readings from Last 1 Encounters:  07/04/24 (!) 148/95  Pulse: 77  Pre-procedure Examination Tooth #31 being treated for the following condition(s): Caries  Pre-procedure Radiograph Date taken: 07/04/2024      Procedure ANESTHESIA Topical anesthetic: Benzocaine  20% Lidocaine  2% epi: 1:100K x Carpules: 1 carpule;  ISOLATION Cotton rolls isolation PREPARATION Caries excavated BASE/LINER Gluma MATRIX No matrix used RESTORATIVE MATERIALS [place]: NA  FINISHING/POLISHING Occlusion checked and adjusted Restoration finished and polished using diamonds and carbides  Post-procedure examination: Uneventful procedure, goal with procedure achieved.   ASSESSMENT Diagnosis: Caries, restorable Prognosis: Good  PLAN Post-operative Instructions Patient informed about possible short-term sensitivity to cold.  Provider should be contacted in the event of severe sensitivity or any sensitivity to heat, and/or pain on biting. Patient advised to be careful with soft tissues on the anesthetized side until the anesthesia wears  off.  Patient Education Patient educated on proper oral hygiene techniques and the importance of regular dental check-ups in order to maximize the longevity of the restoration.   Treatment Providers Romualdo Nash, DA II Hampton Slade, DDS [facility] [address] [place], [place]  [address] [phone] 07/04/24       [1] History reviewed. No pertinent past medical history. [2] Current Outpatient Medications on File Prior to Visit  Medication Sig Dispense Refill  . doxycycline  (VIBRA -TABS) 100 mg tablet Take 1 tablet by mouth in the morning and at bedtime.    . meloxicam (MOBIC) 7.5 mg tablet Take 7.5 mg by mouth 1 (one) time each day.    . mupirocin  (BACTROBAN ) 2 % ointment APPLY TOPICALLY TWICE DAILY TO WOUND    . albuterol HFA (PROVENTIL HFA;VENTOLIN HFA) 90 mcg/actuation inhaler Inhale 2 puffs.    SABRA amoxicillin-pot clavulanate (AUGMENTIN) 875-125 mg tablet Take 1 tablet by mouth in the morning and at bedtime.    SABRA aspirin 81 mg chewable tablet Chew 81 mg.    . carvediloL  (COREG ) 25 mg tablet Take 25 mg by mouth.    . carvediloL  (COREG ) 25 mg tablet Take 25 mg by mouth.    . fluconazole  (DIFLUCAN ) 200 mg tablet TAKE 1 TABLET BY MOUTH EVERY DAY FOR 1 DAY    . furosemide  (LASIX ) 40 mg tablet Take 40 mg by mouth.    . furosemide  (LASIX ) 40 mg tablet Take 40 mg by mouth.    . hydrocortisone  1 % cream Apply topically.    . ibuprofen  (ADVIL ,MOTRIN ) 200 mg tablet Take 200-400 mg by mouth.    . methocarbamoL  (ROBAXIN ) 500 mg tablet Take 500 mg by mouth.    . metroNIDAZOLE  (FLAGYL ) 500 mg tablet Take 500 mg by mouth every 12 (twelve) hours.    SABRA  multivitamin with minerals tablet Take 1 tablet by mouth 1 (one) time each day.    . multivitamin,tx-minerals tablet Take by mouth.    . naproxen  (NAPROSYN ) 500 mg tablet Take 500 mg by mouth.    SABRA POTASSIUM ACETATE, BULK, MISC by Not Applicable route.    . potassium chloride   (KLOR-CON  M20) 20 mEq CR tablet Take 20 mEq by mouth.     No current facility-administered medications on file prior to visit.  [3] Not on File [4] Patient Active Problem List Diagnosis  . Abnormal Pap smear of anus  . Anemia  . Fatigue  . ICD (implantable cardioverter-defibrillator) lead failure  . Implantable cardioverter-defibrillator (ICD) in situ  . Left bundle branch block  . Moderate dysplasia of cervix (CIN II)  . Systolic heart failure, chronic (CMS/HCC)  . Tongue burning sensation  . Ventricular fibrillation (CMS/HCC)  . Anemia  . Implantable cardioverter-defibrillator (ICD) in situ  . Left bundle branch block  . Systolic heart failure, chronic (CMS/HCC)  . Ventricular fibrillation (CMS/HCC)  [5] History reviewed. No pertinent surgical history. [6] No family history on file. [7]

## 2024-07-06 ENCOUNTER — Encounter: Payer: Self-pay | Admitting: Physician Assistant

## 2024-07-06 ENCOUNTER — Ambulatory Visit: Payer: MEDICAID | Attending: Physician Assistant | Admitting: Physician Assistant

## 2024-07-06 VITALS — BP 132/84 | HR 75 | Ht 63.0 in | Wt 117.0 lb

## 2024-07-06 DIAGNOSIS — Z95810 Presence of automatic (implantable) cardiac defibrillator: Secondary | ICD-10-CM | POA: Diagnosis not present

## 2024-07-06 DIAGNOSIS — I428 Other cardiomyopathies: Secondary | ICD-10-CM | POA: Diagnosis not present

## 2024-07-06 DIAGNOSIS — R0789 Other chest pain: Secondary | ICD-10-CM | POA: Diagnosis not present

## 2024-07-06 DIAGNOSIS — I10 Essential (primary) hypertension: Secondary | ICD-10-CM | POA: Diagnosis not present

## 2024-07-06 LAB — CUP PACEART INCLINIC DEVICE CHECK
Battery Remaining Longevity: 14 mo
Brady Statistic RA Percent Paced: 1.6 %
Brady Statistic RV Percent Paced: 98 %
Date Time Interrogation Session: 20250807121035
HighPow Impedance: 47.25 Ohm
Implantable Lead Connection Status: 753985
Implantable Lead Connection Status: 753985
Implantable Lead Connection Status: 753985
Implantable Lead Implant Date: 20110819
Implantable Lead Implant Date: 20110819
Implantable Lead Implant Date: 20110819
Implantable Lead Location: 753858
Implantable Lead Location: 753859
Implantable Lead Location: 753860
Implantable Lead Model: 7121
Implantable Pulse Generator Implant Date: 20190130
Lead Channel Impedance Value: 250 Ohm
Lead Channel Impedance Value: 525 Ohm
Lead Channel Impedance Value: 587.5 Ohm
Lead Channel Pacing Threshold Amplitude: 0.5 V
Lead Channel Pacing Threshold Amplitude: 0.5 V
Lead Channel Pacing Threshold Amplitude: 0.875 V
Lead Channel Pacing Threshold Amplitude: 1.75 V
Lead Channel Pacing Threshold Amplitude: 1.75 V
Lead Channel Pacing Threshold Pulse Width: 0.5 ms
Lead Channel Pacing Threshold Pulse Width: 0.5 ms
Lead Channel Pacing Threshold Pulse Width: 0.5 ms
Lead Channel Pacing Threshold Pulse Width: 0.6 ms
Lead Channel Pacing Threshold Pulse Width: 0.6 ms
Lead Channel Sensing Intrinsic Amplitude: 12 mV
Lead Channel Sensing Intrinsic Amplitude: 2.6 mV
Lead Channel Setting Pacing Amplitude: 2 V
Lead Channel Setting Pacing Amplitude: 2 V
Lead Channel Setting Pacing Amplitude: 2.75 V
Lead Channel Setting Pacing Pulse Width: 0.5 ms
Lead Channel Setting Pacing Pulse Width: 0.6 ms
Lead Channel Setting Sensing Sensitivity: 0.5 mV
Pulse Gen Serial Number: 9782720

## 2024-07-06 MED ORDER — FUROSEMIDE 40 MG PO TABS: 40.0000 mg | ORAL_TABLET | Freq: Every day | ORAL | 3 refills | Status: AC

## 2024-07-06 MED ORDER — CARVEDILOL 25 MG PO TABS: 25.0000 mg | ORAL_TABLET | Freq: Two times a day (BID) | ORAL | 3 refills | Status: AC

## 2024-07-06 MED ORDER — POTASSIUM CHLORIDE CRYS ER 20 MEQ PO TBCR: 20.0000 meq | EXTENDED_RELEASE_TABLET | Freq: Every day | ORAL | 3 refills | Status: AC

## 2024-07-06 NOTE — Patient Instructions (Addendum)
 Medication Instructions:   Your physician recommends that you continue on your current medications as directed. Please refer to the Current Medication list given to you today.   *If you need a refill on your cardiac medications before your next appointment, please call your pharmacy*    Lab Work:  PLEASE GO DOWN STAIRS  [facility]  FIRST FLOOR   ( GET OFF ELEVATORS WALK TOWARDS WAITING AREA LAB LOCATED BY PHARMACY):  BMET  AND CBC  TODAY       If you have labs (blood work) drawn today and your tests are completely normal, you will receive your results only by: MyChart Message (if you have MyChart) OR A paper copy in the mail If you have any lab test that is abnormal or we need to change your treatment, we will call you to review the results.   Testing/Procedures: NONE ORDERED  TODAY     Follow-Up: At [facility], you and your health needs are our priority.  As part of our continuing mission to provide you with exceptional heart care, our providers are all part of one team.  This team includes your primary Cardiologist (physician) and Advanced Practice Providers or APPs (Physician Assistants and Nurse Practitioners) who all work together to provide you with the care you need, when you need it.   Your next appointment:   NEXT AVAILABLE WITH DR MCALHANY/APP   TO RESTABLISH                                                                                                              AND    6 month(s) Provider:  Dr. Kimble     We recommend signing up for the patient portal called MyChart.  Sign up information is provided on this After Visit Summary.  MyChart is used to connect with patients for Virtual Visits (Telemedicine).  Patients are able to view lab/test results, encounter notes, upcoming appointments, etc.  Non-urgent messages can be sent to your provider as well.   To learn more about what you can do with MyChart, go to [email or web address].   Other  Instructions

## 2024-07-07 ENCOUNTER — Ambulatory Visit: Payer: Self-pay | Admitting: Cardiology

## 2024-07-16 ENCOUNTER — Other Ambulatory Visit: Payer: Self-pay | Admitting: Internal Medicine

## 2024-10-05 ENCOUNTER — Encounter: Payer: MEDICAID | Admitting: Obstetrics and Gynecology

## 2024-12-20 ENCOUNTER — Emergency Department (HOSPITAL_COMMUNITY)
Admission: EM | Admit: 2024-12-20 | Discharge: 2024-12-20 | Discharge status: Against Medical Advice | Payer: MEDICAID | Attending: Emergency Medicine | Admitting: Emergency Medicine

## 2024-12-20 DIAGNOSIS — I10 Essential (primary) hypertension: Secondary | ICD-10-CM | POA: Diagnosis present

## 2024-12-20 DIAGNOSIS — Z5321 Procedure and treatment not carried out due to patient leaving prior to being seen by health care provider: Secondary | ICD-10-CM | POA: Diagnosis not present

## 2024-12-20 NOTE — ED Triage Notes (Signed)
 Patient reports elevated blood pressure BP=170/130 this evening , takes Rx antihypertensive medication .

## 2024-12-20 NOTE — ED Notes (Signed)
 PT had vitals taken and then stated she did nto want to stay. She thought her BP was high but that was before she had taken her medicaiton. Pt states she is going home and if her BP goes up she will come back. Denies any pain. Pt signed AMA

## 2024-12-25 ENCOUNTER — Ambulatory Visit: Payer: MEDICAID | Admitting: Cardiovascular Disease

## 2025-01-01 ENCOUNTER — Ambulatory Visit: Payer: MEDICAID | Admitting: Cardiovascular Disease

## 2025-03-23 ENCOUNTER — Ambulatory Visit: Payer: MEDICAID | Admitting: Cardiovascular Disease
